# Patient Record
Sex: Female | Born: 1970 | Race: Black or African American | Hispanic: No | Marital: Married | Smoking: Never smoker
Health system: Southern US, Community
[De-identification: ages and names within clinical notes are randomized; demographics above are authoritative.]

## PROBLEM LIST (undated history)

## (undated) DIAGNOSIS — I10 Essential (primary) hypertension: Secondary | ICD-10-CM

## (undated) DIAGNOSIS — R748 Abnormal levels of other serum enzymes: Secondary | ICD-10-CM

## (undated) DIAGNOSIS — K219 Gastro-esophageal reflux disease without esophagitis: Secondary | ICD-10-CM

## (undated) HISTORY — PX: DILATION AND CURETTAGE OF UTERUS: SHX78

## (undated) HISTORY — PX: ECTOPIC PREGNANCY SURGERY: SHX613

---

## 2000-07-13 ENCOUNTER — Other Ambulatory Visit: Admission: RE | Admit: 2000-07-13 | Discharge: 2000-07-13 | Payer: Self-pay | Admitting: Gynecology

## 2003-02-11 ENCOUNTER — Other Ambulatory Visit: Admission: RE | Admit: 2003-02-11 | Discharge: 2003-02-11 | Payer: Self-pay | Admitting: Gynecology

## 2004-08-18 ENCOUNTER — Other Ambulatory Visit: Admission: RE | Admit: 2004-08-18 | Discharge: 2004-08-18 | Payer: Self-pay | Admitting: Gynecology

## 2007-07-18 ENCOUNTER — Inpatient Hospital Stay (HOSPITAL_COMMUNITY): Admission: AD | Admit: 2007-07-18 | Discharge: 2007-07-18 | Payer: Self-pay | Admitting: Obstetrics and Gynecology

## 2007-07-21 ENCOUNTER — Inpatient Hospital Stay (HOSPITAL_COMMUNITY): Admission: AD | Admit: 2007-07-21 | Discharge: 2007-07-21 | Payer: Self-pay | Admitting: Obstetrics and Gynecology

## 2007-07-24 ENCOUNTER — Inpatient Hospital Stay (HOSPITAL_COMMUNITY): Admission: AD | Admit: 2007-07-24 | Discharge: 2007-07-24 | Payer: Self-pay | Admitting: Family Medicine

## 2007-07-27 ENCOUNTER — Ambulatory Visit (HOSPITAL_COMMUNITY): Admission: AD | Admit: 2007-07-27 | Discharge: 2007-07-27 | Payer: Self-pay | Admitting: Obstetrics and Gynecology

## 2007-07-27 ENCOUNTER — Encounter (INDEPENDENT_AMBULATORY_CARE_PROVIDER_SITE_OTHER): Payer: Self-pay | Admitting: Obstetrics and Gynecology

## 2008-01-30 ENCOUNTER — Other Ambulatory Visit: Admission: RE | Admit: 2008-01-30 | Discharge: 2008-01-30 | Payer: Self-pay | Admitting: Obstetrics and Gynecology

## 2008-03-28 ENCOUNTER — Ambulatory Visit (HOSPITAL_COMMUNITY): Admission: RE | Admit: 2008-03-28 | Discharge: 2008-03-28 | Payer: Self-pay | Admitting: Obstetrics & Gynecology

## 2008-04-25 ENCOUNTER — Ambulatory Visit (HOSPITAL_COMMUNITY): Admission: RE | Admit: 2008-04-25 | Discharge: 2008-04-25 | Payer: Self-pay | Admitting: Obstetrics & Gynecology

## 2008-06-22 ENCOUNTER — Ambulatory Visit: Payer: Self-pay | Admitting: Family Medicine

## 2008-06-22 ENCOUNTER — Inpatient Hospital Stay (HOSPITAL_COMMUNITY): Admission: AD | Admit: 2008-06-22 | Discharge: 2008-06-22 | Payer: Self-pay | Admitting: Obstetrics & Gynecology

## 2008-06-25 ENCOUNTER — Inpatient Hospital Stay (HOSPITAL_COMMUNITY): Admission: AD | Admit: 2008-06-25 | Discharge: 2008-07-03 | Payer: Self-pay | Admitting: Obstetrics & Gynecology

## 2008-06-25 ENCOUNTER — Ambulatory Visit: Payer: Self-pay | Admitting: Gynecology

## 2008-06-26 ENCOUNTER — Encounter: Payer: Self-pay | Admitting: Obstetrics & Gynecology

## 2008-06-27 ENCOUNTER — Encounter: Payer: Self-pay | Admitting: Obstetrics & Gynecology

## 2008-06-30 ENCOUNTER — Encounter: Payer: Self-pay | Admitting: Obstetrics & Gynecology

## 2008-06-30 ENCOUNTER — Encounter (INDEPENDENT_AMBULATORY_CARE_PROVIDER_SITE_OTHER): Payer: Self-pay | Admitting: Gynecology

## 2008-06-30 HISTORY — PX: TUBAL LIGATION: SHX77

## 2008-07-07 ENCOUNTER — Ambulatory Visit: Payer: Self-pay | Admitting: Obstetrics & Gynecology

## 2008-07-09 ENCOUNTER — Ambulatory Visit: Payer: Self-pay | Admitting: Obstetrics & Gynecology

## 2008-07-16 ENCOUNTER — Ambulatory Visit: Payer: Self-pay | Admitting: Obstetrics & Gynecology

## 2010-10-27 ENCOUNTER — Encounter: Admission: RE | Admit: 2010-10-27 | Payer: Self-pay | Source: Home / Self Care | Admitting: Internal Medicine

## 2010-11-02 ENCOUNTER — Encounter
Admission: RE | Admit: 2010-11-02 | Discharge: 2010-11-02 | Payer: Self-pay | Source: Home / Self Care | Attending: Internal Medicine | Admitting: Internal Medicine

## 2010-11-03 ENCOUNTER — Ambulatory Visit: Payer: Worker's Compensation | Attending: Internal Medicine | Admitting: Physical Therapy

## 2010-11-03 DIAGNOSIS — IMO0001 Reserved for inherently not codable concepts without codable children: Secondary | ICD-10-CM | POA: Insufficient documentation

## 2010-11-03 DIAGNOSIS — M546 Pain in thoracic spine: Secondary | ICD-10-CM | POA: Insufficient documentation

## 2010-11-03 DIAGNOSIS — M542 Cervicalgia: Secondary | ICD-10-CM | POA: Insufficient documentation

## 2010-11-03 DIAGNOSIS — R5381 Other malaise: Secondary | ICD-10-CM | POA: Insufficient documentation

## 2010-11-03 DIAGNOSIS — M25559 Pain in unspecified hip: Secondary | ICD-10-CM | POA: Insufficient documentation

## 2010-11-09 ENCOUNTER — Ambulatory Visit: Payer: Worker's Compensation | Admitting: *Deleted

## 2010-11-11 ENCOUNTER — Ambulatory Visit: Payer: Worker's Compensation | Admitting: *Deleted

## 2010-11-16 ENCOUNTER — Ambulatory Visit: Payer: Worker's Compensation | Admitting: Physical Therapy

## 2010-11-18 ENCOUNTER — Ambulatory Visit: Payer: Worker's Compensation | Admitting: Physical Therapy

## 2010-11-23 ENCOUNTER — Ambulatory Visit: Payer: Worker's Compensation | Admitting: Physical Therapy

## 2010-11-25 ENCOUNTER — Ambulatory Visit: Payer: Worker's Compensation | Admitting: Physical Therapy

## 2010-11-30 ENCOUNTER — Ambulatory Visit: Payer: Worker's Compensation | Admitting: Physical Therapy

## 2010-12-02 ENCOUNTER — Ambulatory Visit: Payer: Worker's Compensation | Admitting: Physical Therapy

## 2011-02-15 NOTE — Op Note (Signed)
NAME:  Sabrina [name], Sabrina [name] [name] NO.:  [id number]   MEDICAL RECORD NO.:  [id number]          PATIENT TYPE:  MAT   LOCATION:  MATC                          [facility]ILITY:  [facility]   PHYSICIAN:  Sabrina [name] Sabrina [name] [name], M.D. DATE OF BIRTH:  1971-03-27   DATE OF PROCEDURE:  07/27/2007  DATE OF DISCHARGE:                               OPERATIVE REPORT   PREOPERATIVE DIAGNOSIS:  Right ectopic pregnancy.   POSTOPERATIVE DIAGNOSES:  1. Right ectopic pregnancy.  2. Uterine fibroids.   OPERATION PERFORMED:  1. Laparoscopy with salpingostomy for a cure of ectopic pregnancy.   SURGEON:  Sabrina [name] Sabrina [name] [name], M.D.   ASSISTANT:  No assistant.   ANESTHESIA:  General.   ESTIMATED BLOOD LOSS:  The estimated blood loss including preexisting  hemoperitoneum was 500 mL.   DESCRIPTION OF THE OPERATION:  After being informed of the planned  procedure with possible complications including bleeding with need a  transfusion, infection, injury to other organs, need for salpingectomy,  and need for laparotomy informed consent was obtained.  The patient is  brought to OR #3 and was given general anesthesia with endotracheal  intubation without any complication.  She was placed in the lithotomy  position, prepped and draped in a sterile fashion.  A Foley catheter was  inserted in her bladder.  Pelvic exam revealed an anteverted uterus,  which was bulky and mobile, right adnexa which was bulky and a left  adnexa which was normal.  A speculum was inserted, anterior lip of the  cervix was grasped with a tenaculum forceps and an acorn manipulator was  placed in the uterus.   We infiltrate the umbilical area with 5 mL of Marcaine 0.25 and perform  a semi elliptical incision, which was brought down sharply to the  fascia.  The fascia was identified, grasped with Kocher forceps and  incised.  Peritoneum was entered bluntly.  A pursestring suture of 0-  Vicryl was placed on the fascia and a 10 mm Hassan trocar was  easily  inserted and held in place with the previously placed suture.  This  allowed [place] for insufflation of the pneumoperitoneum with CO2 at a  maximum pressure of 15 mmHg.  A laparoscope was inserted in the abdomen.  Observation:  We noted a 500 mL preexisting hemoperitoneum with  organized clot, a uterus that was difficult to mobilize because of a  large 6 cm x 6 cm anterior lower uterine segment fibroid, left tube that  appeared normal, and left ovary was normal.  We were unable to see the  right side of the pelvis and so a 10 mm suprapubic trocar as well as a 5  mm right lower quadrant trocar were inserted under direct visualization  after infiltrating with Marcaine 0.25.  This allowed [place] suction of the  preexisting clots and mobilization of the right tube, which had a 4.5 cm  ectopic pregnancy in the isthmic ampullary area.  There was no active  bleeding.  The ovary appeared normal.   Using vasopressin 20 mL and 50 mL we infiltrated the mesosalpinx and the  serosa of the tube until  complete blanching.  Using a monopolar Nezhat-  tip we opened the tube for a 1.5-cm distance and using hydrodissection  and suction we removed the pregnancy, which was sent separately to  pathology.  We then irrigated profusely and we did note three sites of  bleeding on our salpingostomy, which were controlled with bipolar  cauterization.  We again irrigated profusely and removed all clots.  We  had satisfactory hemostasis.  Instruments were removed after evacuating  the pneumoperitoneum.   The umbilical fascia was closed with previously placed purse-string  suture.  The suprapubic fascia was not closed being that it was  inaccessible to the thickness of the subcutaneous tissues and so the  skin of all three incisions was closed with a subcuticular suture of 4-0  Vicryl and Steri-Strips.   COUNTS:  Instrument and sponge counts were complete x2.   Estimated blood loss including the preexisting  hemoperitoneum was 500 mL  for a minimal blood loss during surgery.   The procedure was very well tolerated by the patient who was taken to  recovery room in a well and stable condition; and, will be discharged  home today.      Sabrina [name] Sabrina [name] [name], M.D.  Electronically Signed     SAR/MEDQ  D:  07/27/2007  T:  07/28/2007  Job:  [id number]

## 2011-02-15 NOTE — H&P (Signed)
NAME:  Sabrina [name], Sabrina [name] NO.:  [id number]   MEDICAL RECORD NO.:  [id number]          PATIENT TYPE:  INP   LOCATION:  9157                          [facility]ILITY:  [facility]   PHYSICIAN:  Lazaro Arms, M.D.   DATE OF BIRTH:  [birth year]   DATE OF ADMISSION:  06/25/2008  DATE OF DISCHARGE:                              HISTORY & PHYSICAL   Sabrina [name] is a 40 year old African American female, gravida 6, para 0-0-5-  0 (one ectopic and four miscarriages).  Estimated date of delivery of  August 30, 1999 by last menstrual period and confirmatory 8-week  sonogram.  The patient's prenatal course has been remarkable because of  an increased Trisomy 18 risk on triple testing and had an ultrasound  here with maternal fetal medicine which showed an echogenic intracardiac  focus, most likely calcified papillary muscle which is a very soft  marker for Down syndrome.  The patient declined amniocentesis at that  time.  Otherwise the pregnancy has been unremarkable.  To my knowledge  the patient  did not have her full chart.  We have only seen her during  the pregnancy.  Had not had evaluation for her multiple early losses. In  any event she came in for a routine visit on June 23, 2008 and had  a blood pressure of 160/90.  I repeated it and it was 150/90 and she had  2+ protein.  That was in the afternoon.  I did labs and began a 24-hour  urine the next morning which she brought back this morning.  It  subsequently returned as 460 mg in 24 hours.  All of her labs were  normal.  She has an elevated white count of 20,000.  I gave her two  doses of Celestone.  She has a uric acid of 6 but platelets and liver  function tests are otherwise normal.  The patient states symptomatically  no headache and no other symptoms at all.  When she came in today to be  seen to go over her lab work and 24-hour urine, her blood pressure was  140/90 and  had 4+ protein.  As a result I am going to admit her to  the  hospital for in-house evaluation with antepartum fetal testing and  ultrasound evaluation.   When I arrived, she had an ultrasound which revealed an estimated fetal  weight of 19th percentile with normal fluid and she was placed on the  monitor and actually has had a very flat strip.  No significant  decelerations noted but very flat.  As a result, I ordered a biophysical  profile which was 8 out of 8, of course NST was 8 out of 10 and had  normal Doppler flow studies as well.  At the present, her blood  pressures are actually stable at 140/90 being the max and under that.  We are going to restart a 24-hour urine tomorrow morning and laboratory  evaluation will be done at the discretion of the physicians going  forward. Again she has received her Celestone.  She did have a normal  Glucola.  The patient  understands very likely this is evolving  preeclampsia and she will require delivery in the coming days, hopefully  further out.   PAST MEDICAL HISTORY:  Negative.   PAST SURGICAL HISTORY:  She has had a D&C and surgery for right ectopic.   OB HISTORY:  As stated above.  She had an ectopic back in October 2008  here at [facility].   ALLERGIES:  None.   SOCIAL HISTORY:  She is married.  She works at [employer].   FAMILY HISTORY:  Hypertension and diabetes.   PHYSICAL EXAMINATION:  HEENT:  Unremarkable.  NECK:  Thyroid is normal.  LUNGS:  Clear.  HEART:  Regular rate and rhythm.  No murmur, rub, or gallop.  BREASTS:  Deferred.  ABDOMEN:  Fundal height of 30 cm.  PELVIC:  Cervix is long, thick and closed.  EXTREMITIES:  No edema.  NEUROLOGIC:  Grossly intact.  DTRs 2+.  Really no significant edema.   LABORATORY DATA:  Blood type is B positive.  Rubella is immune.  Hepatitis B negative.  HIV is nonreactive. Serology is nonreactive. Pap  smear was normal.  GC and Chlamydia negative.  Her AFP again showed  increased Trisomy risk of 1 to 66 with the above findings  noted on  ultrasound.  Her 28-week hemoglobin was 11.6, hematocrit 34.6. Glucola  112. Sickle test was negative.   IMPRESSION:  1. Intrauterine pregnancy at 30 weeks and 5 days gestation.  2. Evolving preeclampsia, presently mild by criteria.  3. Estimated fetal weight of 19th percentile with normal fluid.  4. Status post Celestone x2.  5. Reassuring antepartum fetal testing with biophysical profile of 8      out of 10 and normal Doppler flow studies.  However, with ongoing      flat nonstress test.   PLAN:  The patient will be continuing to undergo antepartum fetal  testing.  Will do another biophysical profile in the morning.  We will  restart her 24-hour urine in the morning as well  and of course, manage  her appropriately to the status of her evolving preeclampsia.  The  patient and her husband understand and she has known this for the past  couple of days when I have seen her and understands that is why she got  the steroids in anticipation of  a preterm delivery based on her  preeclampsia.      Lazaro Arms, M.D.  Electronically Signed     LHE/MEDQ  D:  06/25/2008  T:  06/26/2008  Job:  [id number]

## 2011-02-15 NOTE — Op Note (Signed)
NAME:  Sabrina [name], Sabrina [name] NO.:  [id number]   MEDICAL RECORD NO.:  [id number]           PATIENT TYPE:   LOCATION:                                [facility]   PHYSICIAN:  Ginger Carne, MD  DATE OF BIRTH:  Jun 05, 1971   DATE OF PROCEDURE:  DATE OF DISCHARGE:                               OPERATIVE REPORT   PREOPERATIVE DIAGNOSES:  31-3/7 weeks intrauterine growth retardation,  nonreassuring fetal heart rate, mild pre-eclampsia, and sterilization.   POSTOPERATIVE DIAGNOSES:  31-3/7 weeks intrauterine growth retardation,  nonreassuring fetal heart rate, mild pre-eclampsia, and sterilization,  preterm viable delivery of female infant.   PROCEDURE:  Primary low transverse cesarean section and Pomeroy  bilateral tubal ligation.   SURGEON:  Ginger Carne, MD   ASSISTANT:  None.   COMPLICATIONS:  None immediate.   ESTIMATED BLOOD LOSS:  700 mL.   Operative findings is that of a preterm infant female in a transverse  lie.  No gross abnormalities.  Baby cried spontaneously at delivery.  Uterus, tubes, and ovaries showed normal decidual changes of pregnancy.  Both tubes were identified from their isthmus to fimbriated ends  separated apart from their respective round ligaments.   OPERATIVE PROCEDURE:  The patient prepped and draped in the usual  fashion and placed in the left lateral supine position.  Betadine  solution used for antiseptic, and the patient was catheterized prior to  procedure.  After adequate spinal analgesia, a Pfannenstiel incision was  made and the abdomen opened.  Lower uterine segment incised transversely  after developing the bladder flap.  Because of the transverse lie, the  incision was not adequate to safely deliver the fetus, and a T-incision  was made to adequately do so.  Afterwards, the baby delivered.  The cord  clamped and cut, and infant given to the pediatric staff after bulb  suctioning.  Placenta removed manually.  Uterus  was inspected.  Closure  of the uterine musculature in one layer with 0 Vicryl running  interlocking suture.  Bleeding points hemostatically checked.  Blood  clots removed.   A Pomeroy bilateral tubal ligation was performed on either side by  bilaterally accessing 2-4 cm of tube placed in a loop fashion.  A 2-0  plain catgut was then placed around the tube twice.  Afterwards, the  tubes were cut above said knot, sent to pathology, the tips cauterized.  Uterus replaced in the abdomen.  Bleeding points hemostatically  checked.  Blood clots removed.  Closure of the fascia in one layer with  0 PDS running double loop suture and the skin staples for the skin.  Instrument and sponge count were correct.  The patient tolerated the  procedure well, returned to the postanesthesia recovery room in  excellent condition.      Ginger Carne, MD  Electronically Signed     SHB/MEDQ  D:  06/30/2008  T:  07/01/2008  Job:  [id number]

## 2011-02-15 NOTE — Discharge Summary (Signed)
NAME:  Sabrina [name], Sabrina [name] NO.:  [id number]   MEDICAL RECORD NO.:  [id number]          PATIENT TYPE:  INP   LOCATION:  9304                          [facility]   PHYSICIAN:  Ginger Carne, MD  DATE OF BIRTH:  03/15/1971   DATE OF ADMISSION:  06/25/2008  DATE OF DISCHARGE:  07/03/2008                               DISCHARGE SUMMARY   FINAL DIAGNOSES:  31-3/7 weeks severe preeclampsia, fetal growth lag and  nonreassuring fetal heart rate, persistent hypertension   PROCEDURE:  Primary low-transverse cesarean section and T incision,  postpartum sterilization, Pomeroy right bilateral tubal ligation.   REASON FOR ADMISSION:  This is a G6, P-0-0-5-0 who was well dated at 23-  3/7th weeks when she presented for a prenatal visit at [facility] where  she was being followed for evolving preeclampsia.  Her pregnancy was  otherwise notable for increased T-18 rest, she declined amnio.  Her  prenatal visit 2 days before day of admission showed blood pressures  115/92, 2+ proteinuria.  Her 24-hour urine came back with 460 mg of  protein.  She was treated with Celestone.  She was asymptomatic for  preeclampsia.  Her uric acid was 6, platelets and LFTs were normal then  on day of admission, she again was 147/90 but had 4+ proteinuria.  Ultrasound for BPP was 8/8 without significant decelerations.  She had  normal Doppler studies and she was admitted for evaluation of monitoring  of her evolving preeclampsia.   PERTINENT LABORATORY DATA:  Her 24-hour inpatient urine collection was  significant for 714 mg of protein.  Her admission hemoglobin was 11.2.  Postoperatively, she was 8.8.  Her platelets remained stable in the  200s.  Her routine chemistries were essentially within normal limits.  Her ultrasound on day of admission showed growth lag of the fetus to  19th percentile.  AFI was normal.  Placenta was normal and baby was  cephalic with normal AFI.  Her BPP was 8/8, AST  was 3.47, no ADSV, no  RDFC.  Two days later her Dopplers studies remained normal.  She did  have an 8/8 BPP.   PROCEDURES:  Primary low-transverse cesarean section of viable infant  who was sent to NICU, Pomeroy bilateral tubal ligation, iron deficiency,  acute blood loss anemia.   HOSPITAL COURSE:  The patient was placed on serial BP checks, INO and  continuous monitoring as well as daily fetal testing with MFM  consultations.  On hospital day #3, she was noted to meet criteria for  severe preeclampsia with 2 episodes of severe range of BP.  Fetal  testing remained reassuring.  On hospital day #4, she was placed on  Lasix and labetalol due to increasing blood pressures.  On hospital day  #5, there was some concern with nonreassuring fetal heart rate tracing,  some late decelerations, which spontaneously resolved.  For this reason,  MFM came and evaluated the patient and due to her worsening  preeclampsia, lagging fetal growth and nonreassuring fetal heart rate,  they did recommend delivery with max sulfate given for seizure  prophylaxis.  She, at  that time, had her primary LTCS with Pomeroy  bilateral postpartum tubal ligation.  There was a T incision may post.  Operatively, she did fairly well with her diuresis and mag was  discontinued after 24 hours.  Her blood pressures, however, did remain  in the elevated range of 150/70s to high 80s and for this reason, she  was given labetalol 200 mg twice a day as well as hydrochlorothiazide 25  mg once a day to control her blood pressure.  She was also placed on  iron tablet 1 a day, prenatal vitamin 1 a day, ibuprofen 600 every 6  hours and Percocet 1 or 2 every 4-6 hours.  Her followup was to be  with home health to remove her staples on July 08, 2008, and 4-week  visit for a postoperative check and blood pressure check at [facility]  in 4 weeks.  Her instructions were to return if she had any concerns  with her wound healing or any  fevers, other questions or concerns.      Deirdre Christy Gentles, C.N.M.      Ginger Carne, MD     DP/MEDQ  D:  08/11/2008  T:  08/11/2008  Job:  [id number]

## 2011-07-04 LAB — COMPREHENSIVE METABOLIC PANEL
AST: 16
Albumin: 2.5 — ABNORMAL LOW
Albumin: 2.5 — ABNORMAL LOW
Alkaline Phosphatase: 132 — ABNORMAL HIGH
Alkaline Phosphatase: 149 — ABNORMAL HIGH
BUN: 8
BUN: 8
CO2: 24
Calcium: 9.1
Calcium: 9.2
Chloride: 107
Chloride: 109
Creatinine, Ser: 0.67
Creatinine, Ser: 0.71
GFR calc Af Amer: >60
GFR calc non Af Amer: >60
Glucose, Bld: 73
Potassium: 3.7
Potassium: 4
Sodium: 132 — ABNORMAL LOW
Total Bilirubin: 0.2 — ABNORMAL LOW
Total Bilirubin: 0.3
Total Protein: 5.7 — ABNORMAL LOW
Total Protein: 5.8 — ABNORMAL LOW

## 2011-07-04 LAB — CBC
HCT: 26.1 — ABNORMAL LOW
HCT: 33.3 — ABNORMAL LOW
HCT: 35.9 — ABNORMAL LOW
Hemoglobin: 11.1 — ABNORMAL LOW
Hemoglobin: 11.2 — ABNORMAL LOW
Hemoglobin: 11.9 — ABNORMAL LOW
MCHC: 33.2
MCHC: 33.8
MCHC: 34.5
MCV: 94.9
MCV: 97.2
MCV: 98.8
Platelets: 278
RBC: 2.65 — ABNORMAL LOW
RBC: 3.39 — ABNORMAL LOW
RBC: 3.42 — ABNORMAL LOW
RDW: 14
WBC: 10
WBC: 11.9 — ABNORMAL HIGH
WBC: 8.8

## 2011-07-04 LAB — BASIC METABOLIC PANEL
BUN: 9
CO2: 28
Calcium: 7.3 — ABNORMAL LOW
Chloride: 102
Creatinine, Ser: 0.88
Glucose, Bld: 99

## 2011-07-04 LAB — URINALYSIS, ROUTINE W REFLEX MICROSCOPIC
Glucose, UA: NEGATIVE
Ketones, ur: NEGATIVE
Urobilinogen, UA: 0.2
pH: 6

## 2011-07-04 LAB — TYPE AND SCREEN: Antibody Screen: NEGATIVE

## 2011-07-04 LAB — URINE MICROSCOPIC-ADD ON

## 2011-07-04 LAB — PROTEIN, URINE, 24 HOUR: Urine Total Volume-UPROT: 1400

## 2011-07-04 LAB — CREATININE CLEARANCE, URINE, 24 HOUR
Creatinine Clearance: 157 — ABNORMAL HIGH
Creatinine, 24H Ur: 1782
Creatinine, Urine: 127.3
Creatinine: 0.79

## 2011-07-04 LAB — RAPID URINE DRUG SCREEN, HOSP PERFORMED
Amphetamines: NOT DETECTED
Barbiturates: NOT DETECTED
Benzodiazepines: NOT DETECTED
Cocaine: NOT DETECTED
Opiates: NOT DETECTED
Tetrahydrocannabinol: NOT DETECTED

## 2011-07-04 LAB — CREATININE, SERUM: GFR calc non Af Amer: >60

## 2011-07-13 LAB — DIFFERENTIAL
Basophils Absolute: 0
Eosinophils Absolute: 0.1
Eosinophils Relative: 2
Lymphocytes Relative: 23
Lymphs Abs: 1.1
Monocytes Absolute: 0.5

## 2011-07-13 LAB — CBC
HCT: 26.8 — ABNORMAL LOW
HCT: 34.5 — ABNORMAL LOW
Hemoglobin: 11.8 — ABNORMAL LOW
Hemoglobin: 9.2 — ABNORMAL LOW
MCHC: 34.3
MCV: 89.6
RDW: 12.4
RDW: 12.7

## 2011-07-13 LAB — URINALYSIS, ROUTINE W REFLEX MICROSCOPIC
Bilirubin Urine: NEGATIVE
Glucose, UA: NEGATIVE
Ketones, ur: NEGATIVE
Protein, ur: NEGATIVE
Urobilinogen, UA: 0.2

## 2011-07-13 LAB — HCG, QUANTITATIVE, PREGNANCY
hCG, Beta Chain, Quant, S: 3268 — ABNORMAL HIGH
hCG, Beta Chain, Quant, S: 4645 — ABNORMAL HIGH

## 2011-07-13 LAB — URINE MICROSCOPIC-ADD ON

## 2011-07-13 LAB — CREATININE, SERUM
GFR calc Af Amer: >60
GFR calc non Af Amer: >60

## 2011-07-13 LAB — POCT PREGNANCY, URINE
Operator id: 26670
Preg Test, Ur: POSITIVE

## 2011-07-13 LAB — BUN: BUN: 6

## 2011-07-13 LAB — GC/CHLAMYDIA PROBE AMP, GENITAL: Chlamydia, DNA Probe: NEGATIVE

## 2011-07-13 LAB — WET PREP, GENITAL: Trich, Wet Prep: NONE SEEN

## 2012-04-16 ENCOUNTER — Emergency Department (HOSPITAL_COMMUNITY)
Admission: EM | Admit: 2012-04-16 | Discharge: 2012-04-17 | Disposition: A | Discharge status: Home / Self Care | Payer: 59 | Attending: Emergency Medicine | Admitting: Emergency Medicine

## 2012-04-16 ENCOUNTER — Emergency Department (HOSPITAL_COMMUNITY): Payer: 59

## 2012-04-16 ENCOUNTER — Encounter (HOSPITAL_COMMUNITY): Payer: Self-pay | Admitting: Emergency Medicine

## 2012-04-16 DIAGNOSIS — R079 Chest pain, unspecified: Secondary | ICD-10-CM | POA: Insufficient documentation

## 2012-04-16 DIAGNOSIS — M549 Dorsalgia, unspecified: Secondary | ICD-10-CM | POA: Insufficient documentation

## 2012-04-16 DIAGNOSIS — I10 Essential (primary) hypertension: Secondary | ICD-10-CM | POA: Insufficient documentation

## 2012-04-16 HISTORY — DX: Essential (primary) hypertension: I10

## 2012-04-16 MED ORDER — HYDROCODONE-ACETAMINOPHEN 5-325 MG PO TABS: 2.0000 | ORAL_TABLET | ORAL | Status: AC | PRN

## 2012-04-16 MED ORDER — DIAZEPAM 5 MG PO TABS: 5.0000 mg | ORAL_TABLET | Freq: Two times a day (BID) | ORAL | Status: AC

## 2012-04-16 NOTE — ED Provider Notes (Signed)
History     CSN: [id number]  Arrival date & time 04/16/12  2154   First MD Initiated Contact with Patient 04/16/12 2227      Chief Complaint  Patient presents with  . Back Pain    (Consider location/radiation/quality/duration/timing/severity/associated sxs/prior treatment) HPI Comments: Patient reports that she is having posterior left lateral rib pain since yesterday.  Pain gradually worsening.  She reports that she fell down 3 days ago, but did not have any pain until yesterday.  Pain worse with deep breaths and worse with lateral movement of the back.  She thought that the pain felt like a pulled muscle.  She has taken Flexeril, but does not feel that it helped.  She denies any numbness or tingling.  Denies bowel or bladder incontinence.  No fever or chills.  No cough.    The history is provided by the patient.    Past Medical History  Diagnosis Date  . Hypertension     Past Surgical History  Procedure Date  . Cesarean section   . Ectopic pregnancy surgery     Family History  Problem Relation Age of Onset  . Pulmonary embolism Sister   . Hypertension Other   . Diabetes Other     History  Substance Use Topics  . Smoking status: Never Smoker   . Smokeless tobacco: Not on file  . Alcohol Use: No    OB History    Grav Para Term Preterm Abortions TAB SAB Ect Mult Living                  Review of Systems  Constitutional: Negative for fever and chills.  HENT: Negative for neck pain and neck stiffness.   Respiratory: Negative for cough and shortness of breath.   Cardiovascular: Negative for chest pain.  Gastrointestinal: Negative for nausea and vomiting.  Musculoskeletal: Positive for back pain. Negative for gait problem.  Skin: Negative for color change and wound.  Neurological: Negative for weakness and numbness.    Allergies  Review of patient's allergies indicates no known allergies.  Home Medications   Current Outpatient Rx  Name Route Sig Dispense  Refill  . CYCLOBENZAPRINE HCL 10 MG PO TABS Oral Take 5 mg by mouth 3 (three) times daily as needed. For muscle spasms.    . MELOXICAM 7.5 MG PO TABS Oral Take 15 mg by mouth daily as needed. For pain.    Marland Kitchen SIMETHICONE 80 MG PO CHEW Oral Chew 80 mg by mouth every 6 (six) hours as needed. For gas.    Marland Kitchen VITAMIN D (ERGOCALCIFEROL) 50000 UNITS PO CAPS Oral Take 50,000 Units by mouth every 7 (seven) days. Taken on Fridays.      BP 152/93  Pulse 76  Temp 98.3 F (36.8 C) (Oral)  Resp 18  SpO2 100%  LMP 03/30/2012  Physical Exam  Nursing note and vitals reviewed. Constitutional: She appears well-developed and well-nourished. No distress.  HENT:  Head: Normocephalic and atraumatic.  Mouth/Throat: Oropharynx is clear and moist.  Neck: Normal range of motion. Neck supple.  Cardiovascular: Normal rate, regular rhythm, normal heart sounds and intact distal pulses.   Pulmonary/Chest: Effort normal. No respiratory distress. She has no decreased breath sounds. She has no wheezes. She has no rhonchi. She has no rales.    Musculoskeletal: Normal range of motion.  Neurological: She is alert.  Skin: Skin is warm, dry and intact. No abrasion, no bruising and no ecchymosis noted. She is not diaphoretic.  Psychiatric: She  has a normal mood and affect.    ED Course  Procedures (including critical care time)  Labs Reviewed - No data to display Dg Ribs Unilateral W/chest Left  04/16/2012  *RADIOLOGY REPORT*  Clinical Data: Fall, left rib pain  LEFT RIBS AND CHEST - 3+ VIEW  Comparison: None.  Findings: Cardiomediastinal silhouette is within normal limits. The lungs are clear. No pleural effusion.  No pneumothorax.  No acute osseous abnormality.  No displaced left-sided rib fracture.  IMPRESSION: No acute abnormality or displaced left-sided rib fracture.  Original Report Authenticated By: Harrel Lemon, M.D.     No diagnosis found.    MDM  Negative xray.  No shortness of breath.  Suspect  muscle strain.  Patient discharged home with short course of pain medication and muscle relaxer.  Return precautions discussed.        Pascal Lux Leon, PA-C 04/17/12 [phone]

## 2012-04-16 NOTE — ED Notes (Signed)
Pt states she is having pain in her on her left side on her back near her rib  Pt states pain increases with breathing  Pt states she thought it was a pulled muscle so she took a muscle relaxer without relief

## 2012-04-17 NOTE — ED Provider Notes (Signed)
Medical screening examination/treatment/procedure(s) were performed by non-physician practitioner and as supervising physician I was immediately available for consultation/collaboration.  Flint Melter, MD 04/17/12 [phone]

## 2012-04-17 NOTE — ED Notes (Signed)
Patient discharge to home ambulatory with a steady gait. Respirations equal and unlabored. Skin warm and dry. No acute distress noted.

## 2013-01-25 ENCOUNTER — Encounter: Payer: Self-pay | Admitting: Family Medicine

## 2013-01-25 ENCOUNTER — Ambulatory Visit (INDEPENDENT_AMBULATORY_CARE_PROVIDER_SITE_OTHER): Payer: 59 | Admitting: Family Medicine

## 2013-01-25 VITALS — BP 118/71 | HR 85 | Temp 99.1°F | Ht 65.0 in | Wt 202.8 lb

## 2013-01-25 DIAGNOSIS — E559 Vitamin D deficiency, unspecified: Secondary | ICD-10-CM

## 2013-01-25 DIAGNOSIS — I10 Essential (primary) hypertension: Secondary | ICD-10-CM

## 2013-01-25 DIAGNOSIS — J302 Other seasonal allergic rhinitis: Secondary | ICD-10-CM

## 2013-01-25 DIAGNOSIS — J309 Allergic rhinitis, unspecified: Secondary | ICD-10-CM

## 2013-01-25 MED ORDER — FLUTICASONE PROPIONATE 50 MCG/ACT NA SUSP: 2.0000 | Freq: Every day | NASAL | Status: DC

## 2013-01-25 MED ORDER — AMLODIPINE BESYLATE 5 MG PO TABS: 5.0000 mg | ORAL_TABLET | Freq: Every day | ORAL | Status: DC

## 2013-01-25 MED ORDER — FEXOFENADINE HCL 180 MG PO TABS: 180.0000 mg | ORAL_TABLET | Freq: Every day | ORAL | Status: DC

## 2013-01-25 NOTE — Patient Instructions (Signed)
      Dr Charls Custer's Recommendations  Diet and Exercise discussed with patient.  For nutrition information, I recommend books:  1).Eat to Live by Dr Joel Fuhrman. 2).Prevent and Reverse Heart Disease by Dr Caldwell Esselstyn.  Exercise recommendations are:  If unable to walk, then the patient can exercise in a chair 3 times a day. By flapping arms like a bird gently and raising legs outwards to the front.  If ambulatory, the patient can go for walks for 30 minutes 3 times a week. Then increase the intensity and duration as tolerated.  Goal is to try to attain exercise frequency to 5 times a week.  If applicable: Best to perform resistance exercises (machines or weights) 2 days a week and cardio type exercises 3 days per week.  

## 2013-01-25 NOTE — Progress Notes (Signed)
Patient ID: Sabrina [name], female   DOB: 03/17/1971, 42 y.o.   MRN: [id number] SUBJECTIVE: HPI: Congested for:   Days due to allergies    has had runny and itchy eyes. has had runny, itchy and stuffy nose. has had Sneezing as well. has had Coughing has had no  Wheezing  Medications used for this problem: has not had effective response.  Patient is here for follow up of hypertension: denies Headache;deniesChest Pain;denies weakness;denies Shortness of Breath or Orthopnea;denies Visual changes;denies palpitations;denies cough;denies pedal edema;denies symptoms of TIA or stroke; admits to Compliance with medications. denies Problems with medications.   PMH/PSH: reviewed/updated in Epic  SH/FH: reviewed/updated in Epic.Occupation: Med tech  Allergies: reviewed/updated in [employer]  Medications: reviewed/updated in [employer]  Immunizations: reviewed/updated in Epic  ROS: As above in the HPI. All other systems are stable or negative.  OBJECTIVE: APPEARANCE:  AAF  Patient in no acute distress.The patient appeared well nourished and normally developed. Acyanotic. Waist: VITAL SIGNS:BP 118/71  Pulse 85  Temp(Src) 99.1 F (37.3 C) (Oral)  Ht 5\' 5"  (1.651 m)  Wt 202 lb 12.8 oz (91.989 kg)  BMI 33.75 kg/m2  LMP 12/25/2012   SKIN: warm and  Dry without overt rashes, tattoos and scars  HEAD and Neck: without JVD, Head and scalp: normal Eyes:No scleral icterus. Fundi normal, eye movements normal. Ears: Auricle normal, canal normal, Tympanic membranes normal, insufflation normal. Nose: normal Throat: normal Neck & thyroid: normal  CHEST & LUNGS: Chest wall: normal Lungs: Clear  CVS: Reveals the PMI to be normally located. Regular rhythm, First and Second Heart sounds are normal,  absence of murmurs, rubs or gallops. Peripheral vasculature: Radial pulses: normal Dorsal pedis pulses: normal Posterior pulses: normal  ABDOMEN:  Appearance: normal Benign,, no  organomegaly, no masses, no Abdominal Aortic enlargement. No Guarding , no rebound. No Bruits. Bowel sounds: normal  RECTAL: N/A GU: N/A  EXTREMETIES: nonedematous. Both Femoral and Pedal pulses are normal.  MUSCULOSKELETAL:  Spine: normal Joints: intact  NEUROLOGIC: oriented to time,place and person; nonfocal. Strength is normal Sensory is normal Reflexes are normal Cranial Nerves are normal.  ASSESSMENT: HTN (hypertension) - Plan: BASIC METABOLIC PANEL WITH GFR, amLODipine (NORVASC) 5 MG tablet  Unspecified vitamin D deficiency - Plan: Vitamin D 25 hydroxy  Seasonal allergic rhinitis - Plan: fluticasone (FLONASE) 50 MCG/ACT nasal spray, fexofenadine (ALLEGRA) 180 MG tablet  PLAN:       Dr Woodroe Mode Recommendations  Diet and Exercise discussed with patient.  For nutrition information, I recommend books:  1).Eat to Live by Dr Monico Hoar. 2).Prevent and Reverse Heart Disease by Dr Suzzette Righter.  Exercise recommendations are:  If unable to walk, then the patient can exercise in a chair 3 times a day. By flapping arms like a bird gently and raising legs outwards to the front.  If ambulatory, the patient can go for walks for 30 minutes 3 times a week. Then increase the intensity and duration as tolerated.  Goal is to try to attain exercise frequency to 5 times a week.  If applicable: Best to perform resistance exercises (machines or weights) 2 days a week and cardio type exercises 3 days per week.  Orders Placed This Encounter  Procedures  . BASIC METABOLIC PANEL WITH GFR  . Vitamin D 25 hydroxy   No results found for this or any previous visit (from the past 24 hour(s)). Meds ordered this encounter  Medications  . DISCONTD: amLODipine (NORVASC) 5 MG tablet    Sig:  Take 5 mg by mouth daily.  Marland Kitchen amLODipine (NORVASC) 5 MG tablet    Sig: Take 1 tablet (5 mg total) by mouth daily.    Dispense:  30 tablet    Refill:  5  . fluticasone (FLONASE) 50  MCG/ACT nasal spray    Sig: Place 2 sprays into the nose daily.    Dispense:  16 g    Refill:  6  . fexofenadine (ALLEGRA) 180 MG tablet    Sig: Take 1 tablet (180 mg total) by mouth daily.    Dispense:  30 tablet    Refill:  5   Patient doing well to control the BP. Responsive to meds.  RTC 4months  Azarria Balint P. Modesto Charon, M.D.

## 2013-01-26 LAB — BASIC METABOLIC PANEL WITH GFR
BUN: 10 mg/dL (ref 6–23)
CO2: 25 mEq/L (ref 19–32)
Calcium: 9.2 mg/dL (ref 8.4–10.5)
Chloride: 106 mEq/L (ref 96–112)
Creat: 0.95 mg/dL (ref 0.50–1.10)
GFR, Est African American: 87 mL/min
GFR, Est Non African American: 76 mL/min
Glucose, Bld: 80 mg/dL (ref 70–99)
Potassium: 4.1 mEq/L (ref 3.5–5.3)
Sodium: 139 mEq/L (ref 135–145)

## 2013-01-26 LAB — VITAMIN D 25 HYDROXY (VIT D DEFICIENCY, FRACTURES): Vit D, 25-Hydroxy: 20 ng/mL — ABNORMAL LOW (ref 30–89)

## 2013-01-27 ENCOUNTER — Other Ambulatory Visit: Payer: Self-pay | Admitting: Family Medicine

## 2013-01-27 MED ORDER — VITAMIN D (ERGOCALCIFEROL) 1.25 MG (50000 UNIT) PO CAPS: 50000.0000 [IU] | ORAL_CAPSULE | ORAL | Status: DC

## 2013-02-02 ENCOUNTER — Other Ambulatory Visit: Payer: Self-pay | Admitting: Family Medicine

## 2013-03-11 ENCOUNTER — Other Ambulatory Visit: Payer: Self-pay | Admitting: Family Medicine

## 2013-05-09 ENCOUNTER — Other Ambulatory Visit: Payer: Self-pay | Admitting: Emergency Medicine

## 2013-05-11 ENCOUNTER — Other Ambulatory Visit: Payer: Self-pay | Admitting: Emergency Medicine

## 2013-05-28 ENCOUNTER — Ambulatory Visit: Payer: 59 | Admitting: Family Medicine

## 2013-06-14 ENCOUNTER — Ambulatory Visit: Payer: Self-pay | Admitting: Family Medicine

## 2013-06-21 ENCOUNTER — Ambulatory Visit: Payer: Self-pay | Admitting: Family Medicine

## 2013-06-25 ENCOUNTER — Ambulatory Visit: Payer: Self-pay | Admitting: Family Medicine

## 2013-07-05 ENCOUNTER — Encounter: Payer: Self-pay | Admitting: Family Medicine

## 2013-07-05 ENCOUNTER — Ambulatory Visit (INDEPENDENT_AMBULATORY_CARE_PROVIDER_SITE_OTHER): Payer: 59 | Admitting: Family Medicine

## 2013-07-05 VITALS — BP 118/82 | HR 86 | Temp 97.8°F | Ht 65.0 in | Wt 202.4 lb

## 2013-07-05 DIAGNOSIS — E559 Vitamin D deficiency, unspecified: Secondary | ICD-10-CM

## 2013-07-05 DIAGNOSIS — I10 Essential (primary) hypertension: Secondary | ICD-10-CM

## 2013-07-05 DIAGNOSIS — M549 Dorsalgia, unspecified: Secondary | ICD-10-CM

## 2013-07-05 MED ORDER — CYCLOBENZAPRINE HCL 10 MG PO TABS: 5.0000 mg | ORAL_TABLET | Freq: Three times a day (TID) | ORAL | Status: DC | PRN

## 2013-07-05 MED ORDER — AMLODIPINE BESYLATE 5 MG PO TABS: 5.0000 mg | ORAL_TABLET | Freq: Every day | ORAL | Status: DC

## 2013-07-05 MED ORDER — VITAMIN D (ERGOCALCIFEROL) 1.25 MG (50000 UNIT) PO CAPS: 50000.0000 [IU] | ORAL_CAPSULE | ORAL | Status: DC

## 2013-07-05 NOTE — Progress Notes (Signed)
  Subjective:    Patient ID: Sabrina [name], female    DOB: March 06, 1971, 42 y.o.   MRN: [id number]  HPI This 42 y.o. female presents for evaluation of 4 month follow up.  She Has hx of hypertension. She has some lumbar back pain that is bothering Her from an injury at work a month ago and she would like a refill on the flexeril  Review of Systems C/o back pain.   No chest pain, SOB, HA, dizziness, vision change, N/V, diarrhea, constipation, dysuria, urinary urgency or frequency, myalgias, arthralgias or rash.  Objective:   Physical Exam Vital signs noted  Well developed well nourished female.  HEENT - Head atraumatic Normocephalic                Eyes - PERRLA, Conjuctiva - clear Sclera- Clear EOMI                Ears - EAC's Wnl TM's Wnl Gross Hearing WNL                Nose - Nares patent                 Throat - oropharanx wnl Respiratory - Lungs CTA bilateral Cardiac - RRR S1 and S2 without murmur GI - Abdomen soft Nontender and bowel sounds active x 4 Extremities - No edema. Neuro - Grossly intact. MS - TTP Left LS muscle      Assessment & Plan:  Essential hypertension, benign - Plan: amLODipine (NORVASC) 5 MG tablet  HTN (hypertension) - Plan: amLODipine (NORVASC) 5 MG tablet  Back pain - Plan: cyclobenzaprine (FLEXERIL) 10 MG tablet  Unspecified vitamin D deficiency - Plan: Vitamin D, Ergocalciferol, (DRISDOL) 50000 UNITS CAPS capsule  Deatra Canter FNP

## 2013-07-05 NOTE — Patient Instructions (Addendum)

## 2013-07-18 ENCOUNTER — Other Ambulatory Visit: Payer: Self-pay | Admitting: Family Medicine

## 2013-07-18 DIAGNOSIS — M549 Dorsalgia, unspecified: Secondary | ICD-10-CM

## 2013-07-18 MED ORDER — BACLOFEN 20 MG PO TABS: 20.0000 mg | ORAL_TABLET | Freq: Three times a day (TID) | ORAL | Status: DC

## 2013-08-24 ENCOUNTER — Other Ambulatory Visit: Payer: Self-pay | Admitting: Family Medicine

## 2014-02-28 ENCOUNTER — Telehealth: Payer: Self-pay | Admitting: Family Medicine

## 2014-02-28 DIAGNOSIS — I10 Essential (primary) hypertension: Secondary | ICD-10-CM

## 2014-02-28 MED ORDER — AMLODIPINE BESYLATE 5 MG PO TABS: 5.0000 mg | ORAL_TABLET | Freq: Every day | ORAL | Status: DC

## 2014-02-28 NOTE — Telephone Encounter (Signed)
done

## 2014-03-03 ENCOUNTER — Other Ambulatory Visit: Payer: Self-pay | Admitting: Family Medicine

## 2014-03-20 ENCOUNTER — Telehealth: Payer: Self-pay | Admitting: Family Medicine

## 2014-03-20 NOTE — Telephone Encounter (Signed)
appt scheduled

## 2014-03-26 ENCOUNTER — Encounter: Payer: Self-pay | Admitting: Family Medicine

## 2014-03-26 ENCOUNTER — Encounter (INDEPENDENT_AMBULATORY_CARE_PROVIDER_SITE_OTHER): Payer: Self-pay

## 2014-03-26 ENCOUNTER — Ambulatory Visit (INDEPENDENT_AMBULATORY_CARE_PROVIDER_SITE_OTHER): Payer: 59 | Admitting: Family Medicine

## 2014-03-26 ENCOUNTER — Ambulatory Visit: Payer: 59 | Admitting: Family Medicine

## 2014-03-26 VITALS — BP 125/78 | HR 106 | Temp 99.1°F | Ht 65.0 in | Wt 217.4 lb

## 2014-03-26 DIAGNOSIS — E559 Vitamin D deficiency, unspecified: Secondary | ICD-10-CM

## 2014-03-26 DIAGNOSIS — I10 Essential (primary) hypertension: Secondary | ICD-10-CM

## 2014-03-26 MED ORDER — AMLODIPINE BESYLATE 5 MG PO TABS: 5.0000 mg | ORAL_TABLET | Freq: Every day | ORAL | Status: DC

## 2014-03-26 MED ORDER — VITAMIN D (ERGOCALCIFEROL) 1.25 MG (50000 UNIT) PO CAPS: 50000.0000 [IU] | ORAL_CAPSULE | ORAL | Status: DC

## 2014-03-26 NOTE — Progress Notes (Signed)
   Subjective:    Patient ID: Sabrina [name], female    DOB: December 29, 1970, 43 y.o.   MRN: [id number]  HPI This 43 y.o. female presents for evaluation of routine follow up.  She has had labs in 4/15 and showed Low vitamin D.  She has been inactive for last 6 months and she has gained weight.  She is back to Work now.  She is c/o weight gain.   Review of Systems C/o weight gain. No chest pain, SOB, HA, dizziness, vision change, N/V, diarrhea, constipation, dysuria, urinary urgency or frequency, myalgias, arthralgias or rash.     Objective:   Physical Exam  Vital signs noted  Well developed well nourished female.  HEENT - Head atraumatic Normocephalic                Eyes - PERRLA, Conjuctiva - clear Sclera- Clear EOMI                Ears - EAC's Wnl TM's Wnl Gross Hearing WNL                 Throat - oropharanx wnl Respiratory - Lungs CTA bilateral Cardiac - RRR S1 and S2 without murmur GI - Abdomen soft Nontender and bowel sounds active x 4 Extremities - No edema.      Assessment & Plan:  Unspecified vitamin D deficiency - Plan: amLODipine (NORVASC) 5 MG tablet, Vitamin D, Ergocalciferol, (DRISDOL) 50000 UNITS CAPS capsule  Essential hypertension, benign - Plan: amLODipine (NORVASC) 5 MG tablet, Vitamin D, Ergocalciferol, (DRISDOL) 50000 UNITS CAPS capsule  Weight Gain - Discussed with patient to exercise and to be active since she has deconditioning for sitting around for last 6 months.  Lysbeth Penner FNP

## 2014-07-16 ENCOUNTER — Other Ambulatory Visit: Payer: Self-pay | Admitting: Nurse Practitioner

## 2014-12-17 ENCOUNTER — Ambulatory Visit (INDEPENDENT_AMBULATORY_CARE_PROVIDER_SITE_OTHER): Payer: BLUE CROSS/BLUE SHIELD | Admitting: Family Medicine

## 2014-12-17 VITALS — BP 140/80 | HR 82 | Temp 97.4°F | Resp 16 | Ht 65.75 in | Wt 207.0 lb

## 2014-12-17 DIAGNOSIS — Z0000 Encounter for general adult medical examination without abnormal findings: Secondary | ICD-10-CM | POA: Diagnosis not present

## 2014-12-17 DIAGNOSIS — I10 Essential (primary) hypertension: Secondary | ICD-10-CM

## 2014-12-17 DIAGNOSIS — Z131 Encounter for screening for diabetes mellitus: Secondary | ICD-10-CM | POA: Diagnosis not present

## 2014-12-17 DIAGNOSIS — M5489 Other dorsalgia: Secondary | ICD-10-CM

## 2014-12-17 DIAGNOSIS — E669 Obesity, unspecified: Secondary | ICD-10-CM | POA: Diagnosis not present

## 2014-12-17 LAB — COMPREHENSIVE METABOLIC PANEL
ALBUMIN: 4.1 g/dL (ref 3.5–5.2)
ALK PHOS: 50 U/L (ref 39–117)
ALT: 37 U/L — ABNORMAL HIGH (ref 0–35)
AST: 22 U/L (ref 0–37)
BUN: 8 mg/dL (ref 6–23)
CALCIUM: 9 mg/dL (ref 8.4–10.5)
CHLORIDE: 104 meq/L (ref 96–112)
CO2: 24 meq/L (ref 19–32)
Creat: 0.91 mg/dL (ref 0.50–1.10)
GLUCOSE: 77 mg/dL (ref 70–99)
POTASSIUM: 3.7 meq/L (ref 3.5–5.3)
SODIUM: 138 meq/L (ref 135–145)
TOTAL PROTEIN: 6.7 g/dL (ref 6.0–8.3)
Total Bilirubin: 0.4 mg/dL (ref 0.2–1.2)

## 2014-12-17 LAB — POCT CBC
GRANULOCYTE PERCENT: 68.4 % (ref 37–80)
HEMATOCRIT: 38.6 % (ref 37.7–47.9)
HEMOGLOBIN: 11.6 g/dL — AB (ref 12.2–16.2)
Lymph, poc: 1.3 (ref 0.6–3.4)
MCH, POC: 28.3 pg (ref 27–31.2)
MCHC: 30.1 g/dL — AB (ref 31.8–35.4)
MCV: 94.1 fL (ref 80–97)
MID (cbc): 0.3 (ref 0–0.9)
MPV: 5.9 fL (ref 0–99.8)
POC GRANULOCYTE: 3.4 (ref 2–6.9)
POC LYMPH PERCENT: 25.8 %L (ref 10–50)
POC MID %: 5.8 %M (ref 0–12)
Platelet Count, POC: 304 10*3/uL (ref 142–424)
RBC: 4.1 M/uL (ref 4.04–5.48)
RDW, POC: 12.5 %
WBC: 4.9 10*3/uL (ref 4.6–10.2)

## 2014-12-17 LAB — LIPID PANEL
Cholesterol: 193 mg/dL (ref 0–200)
HDL: 53 mg/dL (ref >=46)
LDL CALC: 115 mg/dL — AB (ref 0–99)
TRIGLYCERIDES: 127 mg/dL (ref <150)
Total CHOL/HDL Ratio: 3.6 Ratio
VLDL: 25 mg/dL (ref 0–40)

## 2014-12-17 LAB — POCT GLYCOSYLATED HEMOGLOBIN (HGB A1C): Hemoglobin A1C: 4.7

## 2014-12-17 LAB — TSH: TSH: 1.205 u[IU]/mL (ref 0.350–4.500)

## 2014-12-17 MED ORDER — METHOCARBAMOL 500 MG PO TABS: 500.0000 mg | ORAL_TABLET | Freq: Three times a day (TID) | ORAL | Status: DC

## 2014-12-17 NOTE — Patient Instructions (Signed)
Take the methocarbamol one pill 3 times daily if needed for muscle relaxant for back. Can take 2 at bedtime if necessary.  Continue using an over-the-counter anti-inflammatory medicine such as Aleve twice daily for the pain if needed  Continue your blood pressure and allergy medications  Work hard on trying to slowly lose some weight by modifying your eating habits.  Return in one year or as needed

## 2014-12-17 NOTE — Progress Notes (Signed)
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 No major acute complaints. She has not had any labs checked for a long time. She sees a gynecologist regular.  Past history: Operations: Cesarean section and a tubal pregnancy Medical illnesses: Hypertension Regular medications: See list Allergies: None known  Social history: Married, 41 child 42 years old. Patient works. Not doing a lot of regular exercise currently goes to the gym some.  Family history: Family history positive for diabetes in her mother  Review of systems: Constitutional: Unremarkable HEENT: Unremarkable Cardiovascular: Unremarkable Respiratory: Unremarkable GI: Unremarkable GU: Unremarkable Muscular skeletal: Has been having some back pains and would like something for that. Take some OTC pain relievers. Endocrine: Unremarkable Neurologic: Unremarkable Psychiatric: Unremarkable Dermatologic: Unremarkable   Physical exam Overweight lady pleasant alert and oriented in no acute distress. Her TMs are normal. Eyes PERRLA. Throat was clear. Neck supple without nodes or thyromegaly. No carotid bruits. Chest is clear. Heart regular without murmurs gallops or arrhythmias. Abdomen soft without mass or tenderness. Pelvic and breast exam not done. Extremities unremarkable. Skin normal. Has some tenderness in her back.  Assessment: Physical examination Hypertension Low back pain  Plan: Continue her blood pressure medication. Advised that she work hard on weight loss. Had a long talk about making wise choices in life to be healthy.  Treated her back with some methocarbamol for muscle accident. If it continues given problems will need to assess further.  Stomach    Results for orders placed or performed in visit on 12/17/14  POCT CBC  Result Value Ref Range   WBC 4.9 4.6 - 10.2 K/uL   Lymph, poc 1.3 0.6 - 3.4   POC LYMPH PERCENT 25.8 10 - 50 %L   MID (cbc) 0.3 0 - 0.9   POC MID % 5.8 0 - 12 %M   POC Granulocyte 3.4 2 - 6.9   Granulocyte percent 68.4 37 - 80 %G   RBC 4.10 4.04 - 5.48 M/uL   Hemoglobin 11.6 (A) 12.2 - 16.2 g/dL   HCT, POC 38.6 37.7 - 47.9 %   MCV 94.1 80 - 97 fL   MCH, POC 28.3 27 - 31.2 pg   MCHC 30.1 (A) 31.8 - 35.4 g/dL   RDW, POC 12.5 %   Platelet Count, POC 304 142 - 424 K/uL   MPV 5.9 0 - 99.8 fL  POCT glycosylated hemoglobin (Hb A1C)  Result Value Ref Range   Hemoglobin A1C 4.7

## 2014-12-18 ENCOUNTER — Encounter: Payer: Self-pay | Admitting: Family Medicine

## 2015-04-23 ENCOUNTER — Other Ambulatory Visit: Payer: Self-pay

## 2015-04-23 DIAGNOSIS — E559 Vitamin D deficiency, unspecified: Secondary | ICD-10-CM

## 2015-04-23 DIAGNOSIS — I10 Essential (primary) hypertension: Secondary | ICD-10-CM

## 2015-05-07 ENCOUNTER — Encounter (INDEPENDENT_AMBULATORY_CARE_PROVIDER_SITE_OTHER): Payer: Self-pay

## 2015-05-07 ENCOUNTER — Ambulatory Visit (INDEPENDENT_AMBULATORY_CARE_PROVIDER_SITE_OTHER): Payer: BLUE CROSS/BLUE SHIELD | Admitting: Physician Assistant

## 2015-05-07 VITALS — BP 142/99 | HR 77 | Temp 98.3°F | Ht 65.75 in | Wt 209.0 lb

## 2015-05-07 DIAGNOSIS — I10 Essential (primary) hypertension: Secondary | ICD-10-CM | POA: Diagnosis not present

## 2015-05-07 MED ORDER — AMLODIPINE BESYLATE 5 MG PO TABS: 5.0000 mg | ORAL_TABLET | Freq: Every day | ORAL | Status: DC

## 2015-05-07 NOTE — Progress Notes (Signed)
   Subjective:    Patient ID: Sabrina [name], female    DOB: Aug 08, 1971, 44 y.o.   MRN: [id number]  HPI 44 y/o female with HTN presents for refill on her antihypertensive. She had labs in March, which were WNL except for mild anemia. She has not her antihypertensive in 2 weeks. Asymptomatic     Review of Systems  Constitutional: Negative.   HENT: Negative.   Eyes: Negative.   Respiratory: Negative.   Cardiovascular: Negative.   Gastrointestinal: Negative.   Endocrine: Negative.   Genitourinary: Negative.   Musculoskeletal: Negative.   Skin: Negative.   Allergic/Immunologic: Negative.   Neurological: Negative.   Hematological: Negative.   Psychiatric/Behavioral: Negative.        Objective:   Physical Exam  Constitutional: She is oriented to person, place, and time. She appears well-developed and well-nourished. No distress.  HENT:  Head: Normocephalic.  Cardiovascular: Normal rate, regular rhythm, normal heart sounds and intact distal pulses.  Exam reveals no gallop and no friction rub.   No murmur heard. Hypertensive   Pulmonary/Chest: Effort normal and breath sounds normal. No respiratory distress. She has no wheezes. She has no rales. She exhibits no tenderness.  Neurological: She is alert and oriented to person, place, and time.  Skin: She is not diaphoretic.  Psychiatric: She has a normal mood and affect. Her behavior is normal. Judgment and thought content normal.          Assessment & Plan:  1. Essential hypertension  - amLODipine (NORVASC) 5 MG tablet; Take 1 tablet (5 mg total) by mouth daily.  Dispense: 30 tablet; Refill: 5  2. Essential hypertension, benign  - amLODipine (NORVASC) 5 MG tablet; Take 1 tablet (5 mg total) by mouth daily.  Dispense: 30 tablet; Refill: 5   RTO 6 months   Miosha Behe A. Benjamin Stain PA-C

## 2015-05-15 ENCOUNTER — Telehealth: Payer: Self-pay | Admitting: Physician Assistant

## 2015-07-06 ENCOUNTER — Emergency Department (HOSPITAL_COMMUNITY)
Admission: EM | Admit: 2015-07-06 | Discharge: 2015-07-06 | Disposition: A | Discharge status: Home / Self Care | Payer: BLUE CROSS/BLUE SHIELD | Source: Home / Self Care | Attending: Family Medicine | Admitting: Family Medicine

## 2015-07-06 ENCOUNTER — Encounter (HOSPITAL_COMMUNITY): Payer: Self-pay | Admitting: *Deleted

## 2015-07-06 DIAGNOSIS — K2990 Gastroduodenitis, unspecified, without bleeding: Secondary | ICD-10-CM

## 2015-07-06 MED ORDER — GI COCKTAIL ~~LOC~~
ORAL | Status: AC
  Filled 2015-07-06: qty 30

## 2015-07-06 MED ORDER — GI COCKTAIL ~~LOC~~
30.0000 mL | Freq: Once | ORAL | Status: AC
  Administered 2015-07-06: 30 mL via ORAL

## 2015-07-06 MED ORDER — RANITIDINE HCL 150 MG PO TABS: 150.0000 mg | ORAL_TABLET | Freq: Two times a day (BID) | ORAL | Status: DC

## 2015-07-06 NOTE — ED Provider Notes (Signed)
CSN: [id number]     Arrival date & time 07/06/15  1302 History   First MD Initiated Contact with Patient 07/06/15 1324     Chief Complaint  Patient presents with  . Abdominal Pain   (Consider location/radiation/quality/duration/timing/severity/associated sxs/prior Treatment) Patient is a 44 y.o. female presenting with abdominal pain. The history is provided by the patient.  Abdominal Pain Pain location:  Epigastric Pain quality: burning   Pain severity:  Mild Onset quality:  Sudden Duration:  2 days Progression:  Partially resolved Chronicity:  New Context: eating   Context comment:  Sx onset 1hr after eating. Relieved by:  None tried Worsened by:  Nothing tried (under a lot of stress at work.) Associated symptoms: nausea   Associated symptoms: no diarrhea, no fever, no hematochezia, no melena and no vomiting     Past Medical History  Diagnosis Date  . Hypertension    Past Surgical History  Procedure Laterality Date  . Cesarean section    . Ectopic pregnancy surgery     Family History  Problem Relation Age of Onset  . Pulmonary embolism Sister   . Hypertension Other   . Diabetes Other   . Diabetes Mother   . Hypertension Mother    Social History  Substance Use Topics  . Smoking status: Never Smoker   . Smokeless tobacco: None  . Alcohol Use: No   OB History    No data available     Review of Systems  Constitutional: Positive for appetite change. Negative for fever.  Gastrointestinal: Positive for nausea and abdominal pain. Negative for vomiting, diarrhea, blood in stool, melena, hematochezia and anal bleeding.  All other systems reviewed and are negative.   Allergies  Review of patient's allergies indicates no known allergies.  Home Medications   Prior to Admission medications   Medication Sig Start Date End Date Taking? Authorizing Provider  amLODipine (NORVASC) 5 MG tablet Take 1 tablet (5 mg total) by mouth daily. 05/07/15   Tiffany A Gann, PA-C   methocarbamol (ROBAXIN) 500 MG tablet Take 1 tablet (500 mg total) by mouth 3 (three) times daily. Patient not taking: Reported on 05/07/2015 12/17/14   Posey Boyer, MD  naproxen (NAPROSYN) 500 MG tablet Take 500 mg by mouth 2 (two) times daily with a meal. As needed    Historical Provider, MD  ranitidine (ZANTAC) 150 MG tablet Take 1 tablet (150 mg total) by mouth 2 (two) times daily. 07/06/15   Billy Fischer, MD   Meds Ordered and Administered this Visit   Medications  gi cocktail (Maalox,Lidocaine,Donnatal) (30 mLs Oral Given 07/06/15 1341)    BP 131/95 mmHg  Pulse 74  Temp(Src) 98.1 F (36.7 C) (Oral)  Resp 16  SpO2 100%  LMP 06/10/2015 No data found.   Physical Exam  Constitutional: She is oriented to person, place, and time. She appears well-developed and well-nourished.  HENT:  Mouth/Throat: Oropharynx is clear and moist.  Neck: Normal range of motion. Neck supple.  Abdominal: Soft. Bowel sounds are normal. She exhibits no distension and no mass. There is tenderness. There is no rebound and no guarding.  Lymphadenopathy:    She has no cervical adenopathy.  Neurological: She is alert and oriented to person, place, and time.  Skin: Skin is warm and dry.  Nursing note and vitals reviewed.   ED Course  Procedures (including critical care time)  Labs Review Labs Reviewed - No data to display  Imaging Review No results found.   Visual  Acuity Review  Right Eye Distance:   Left Eye Distance:   Bilateral Distance:    Right Eye Near:   Left Eye Near:    Bilateral Near:         MDM   1. Gastritis and duodenitis        Billy Fischer, MD 07/06/15 1344

## 2015-07-06 NOTE — ED Notes (Signed)
Pt  Reports   Symptoms    Of   abd  Pain        Nausea     Loose   Stool           Symptoms  X  4  Days        Pt  Sitting upright on  The   Exam table  In no  Acute  Distress

## 2015-07-22 ENCOUNTER — Telehealth: Payer: Self-pay | Admitting: Family Medicine

## 2015-11-21 ENCOUNTER — Other Ambulatory Visit: Payer: Self-pay | Admitting: Physician Assistant

## 2015-12-23 ENCOUNTER — Other Ambulatory Visit: Payer: Self-pay | Admitting: Family Medicine

## 2015-12-23 NOTE — Telephone Encounter (Signed)
Please give this patient an appointment to be seen by a provider. The prescription can be refilled 1.

## 2015-12-23 NOTE — Telephone Encounter (Signed)
Last seen 05/07/15 Sabrina [name]  PCP

## 2016-01-13 ENCOUNTER — Encounter (INDEPENDENT_AMBULATORY_CARE_PROVIDER_SITE_OTHER): Payer: Self-pay

## 2016-01-13 ENCOUNTER — Other Ambulatory Visit: Payer: Self-pay | Admitting: Family Medicine

## 2016-01-13 ENCOUNTER — Ambulatory Visit (INDEPENDENT_AMBULATORY_CARE_PROVIDER_SITE_OTHER): Payer: BLUE CROSS/BLUE SHIELD | Admitting: Nurse Practitioner

## 2016-01-13 ENCOUNTER — Encounter: Payer: Self-pay | Admitting: Nurse Practitioner

## 2016-01-13 VITALS — BP 137/94 | HR 74 | Temp 98.2°F | Ht 65.75 in | Wt 208.0 lb

## 2016-01-13 DIAGNOSIS — M5489 Other dorsalgia: Secondary | ICD-10-CM

## 2016-01-13 MED ORDER — NAPROXEN 500 MG PO TABS: 500.0000 mg | ORAL_TABLET | Freq: Two times a day (BID) | ORAL | Status: DC

## 2016-01-13 MED ORDER — METHOCARBAMOL 500 MG PO TABS: 500.0000 mg | ORAL_TABLET | Freq: Three times a day (TID) | ORAL | Status: DC

## 2016-01-13 NOTE — Progress Notes (Signed)
   Subjective:    Patient ID: Sabrina [name], female    DOB: 19-Jul-1971, 45 y.o.   MRN: [id number]  HPI Patient in today c/o back pain - she has this occur off and on for couple of years- currently the only medication she has is OTC tylenol, ibuprofen and patches from [facility]- she does a lot of heavy lifting at work which irritates her back. 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    Review of Systems  Respiratory: Negative.   Cardiovascular: Negative.   Gastrointestinal: Negative.   Genitourinary: Negative.   Musculoskeletal: Positive for back pain.  Neurological: Negative.   Psychiatric/Behavioral: Negative.   All other systems reviewed and are negative.      Objective:   Physical Exam  Constitutional: She is oriented to person, place, and time. She appears well-developed and well-nourished. No distress.  Cardiovascular: Normal rate, regular rhythm and normal heart sounds.   Pulmonary/Chest: Effort normal and breath sounds normal.  Musculoskeletal:  Thoracic back pain on palpation. FROM of neck Motor strength and sensation of upper ext intact  Neurological: She is alert and oriented to person, place, and time.  Skin: Skin is warm and dry.  Psychiatric: She has a normal mood and affect. Her behavior is normal. Judgment and thought content normal.    BP 137/94 mmHg  Pulse 74  Temp(Src) 98.2 F (36.8 C) (Oral)  Ht 5' 5.75" (1.67 m)  Wt 208 lb (94.348 kg)  BMI 33.83 kg/m2      Assessment & Plan:   1. Midline back pain, unspecified location    Moist heat  Good body mechanics when lifting Meds ordered this encounter  Medications  . methocarbamol (ROBAXIN) 500 MG tablet    Sig: Take 1 tablet (500 mg total) by mouth 3 (three) times daily.    Dispense:  40 tablet    Refill:  0    Order Specific Question:  Supervising  Provider    Answer:  Chipper Herb [[id number]]  . naproxen (NAPROSYN) 500 MG tablet    Sig: Take 1 tablet (500 mg total) by mouth 2 (two) times daily with a meal. Reported on 01/13/2016    Dispense:  60 tablet    Refill:  1    Order Specific Question:  Supervising Provider    Answer:  Chipper Herb [[id number]]   RTO prn  Mary-Margaret Hassell Done, FNP

## 2016-01-13 NOTE — Patient Instructions (Signed)

## 2016-02-11 ENCOUNTER — Ambulatory Visit (INDEPENDENT_AMBULATORY_CARE_PROVIDER_SITE_OTHER): Payer: BLUE CROSS/BLUE SHIELD | Admitting: Family Medicine

## 2016-02-11 ENCOUNTER — Encounter: Payer: Self-pay | Admitting: Family Medicine

## 2016-02-11 VITALS — BP 123/88 | HR 91 | Temp 98.5°F | Ht 65.73 in | Wt 203.2 lb

## 2016-02-11 DIAGNOSIS — I10 Essential (primary) hypertension: Secondary | ICD-10-CM

## 2016-02-11 DIAGNOSIS — Z0000 Encounter for general adult medical examination without abnormal findings: Secondary | ICD-10-CM

## 2016-02-11 NOTE — Progress Notes (Signed)
   HPI  Patient presents today for physical exam.  Patient explains that she is in good health, she has some bilateral shoulder pain which she deals with by using approximately 2-500 mg naproxen per week. She also uses Robaxin at the same time.  She does not exercise regularly but is planning to start, she's been watching her diet lately cutting out sodas, however she does have several dietary indiscretions.  Hypertension No chest pain, dyspnea, palpitations, leg edema. She takes amlodipine daily. She plans to become more active.  She had her last Pap smear within 3 years.  PMH: Smoking status noted ROS: Per HPI  Objective: BP 123/88 mmHg  Pulse 91  Temp(Src) 98.5 F (36.9 C) (Oral)  Ht 5' 5.73" (1.67 m)  Wt 203 lb 3.2 oz (92.171 kg)  BMI 33.05 kg/m2 Gen: NAD, alert, cooperative with exam HEENT: NCAT, EOMI, PERRL, nares clear, TMs normal bilaterally, oropharynx clear CV: RRR, good S1/S2, no murmur Resp: CTABL, no wheezes, non-labored Abd: SNTND, BS present, no guarding or organomegaly Ext: No edema, warm Neuro: Alert and oriented, strength 5/5 and sensation intact in bilateral lower extremities  Assessment and plan:  # Annual physical exam Normal exam Overweight, discussed diet and exercise strategies. Basic labs ordered, urinalysis recommended by her employer so I went ahead and check that as well. Previous A1c was 4.7 Return to clinic per usual routine  # Hypertension Blood pressure very well controlled Continue amlodipine   Orders Placed This Encounter  Procedures  . Urinalysis, Complete    Standing Status: Future     Number of Occurrences:      Standing Expiration Date: 02/10/2017  . CMP14+EGFR    Standing Status: Future     Number of Occurrences:      Standing Expiration Date: 02/10/2017  . CBC with Differential/Platelet    Standing Status: Future     Number of Occurrences:      Standing Expiration Date: 02/10/2017  . TSH    Standing Status: Future    Number of Occurrences:      Standing Expiration Date: 02/10/2017  . Lipid panel    Standing Status: Future     Number of Occurrences:      Standing Expiration Date: 02/10/2017  . VITAMIN D 25 Hydroxy (Vit-D Deficiency, Fractures)    Standing Status: Future     Number of Occurrences:      Standing Expiration Date: 02/10/2017    Laroy Apple, MD [facility] 02/11/2016, 2:43 PM

## 2016-02-11 NOTE — Patient Instructions (Signed)
Great to meet you!  Make a lab appt for fasting labs.   We will call within 1 week of the labs.   Be sure to get your mammograms each year.

## 2016-02-15 ENCOUNTER — Other Ambulatory Visit: Payer: BLUE CROSS/BLUE SHIELD

## 2016-02-15 DIAGNOSIS — Z0000 Encounter for general adult medical examination without abnormal findings: Secondary | ICD-10-CM

## 2016-02-15 LAB — URINALYSIS, COMPLETE
Bilirubin, UA: NEGATIVE
GLUCOSE, UA: NEGATIVE
KETONES UA: NEGATIVE
NITRITE UA: NEGATIVE
SPEC GRAV UA: 1.025 (ref 1.005–1.030)
UUROB: 0.2 mg/dL (ref 0.2–1.0)
pH, UA: 6 (ref 5.0–7.5)

## 2016-02-15 LAB — MICROSCOPIC EXAMINATION

## 2016-02-16 ENCOUNTER — Other Ambulatory Visit: Payer: Self-pay | Admitting: Family Medicine

## 2016-02-16 LAB — CBC WITH DIFFERENTIAL/PLATELET
BASOS: 1 %
Basophils Absolute: 0 10*3/uL (ref 0.0–0.2)
EOS (ABSOLUTE): 0.1 10*3/uL (ref 0.0–0.4)
Eos: 1 %
HEMATOCRIT: 37.7 % (ref 34.0–46.6)
HEMOGLOBIN: 13 g/dL (ref 11.1–15.9)
IMMATURE GRANS (ABS): 0 10*3/uL (ref 0.0–0.1)
Immature Granulocytes: 0 %
LYMPHS: 37 %
Lymphocytes Absolute: 1.3 10*3/uL (ref 0.7–3.1)
MCH: 30.6 pg (ref 26.6–33.0)
MCHC: 34.5 g/dL (ref 31.5–35.7)
MCV: 89 fL (ref 79–97)
MONOCYTES: 8 %
Monocytes Absolute: 0.3 10*3/uL (ref 0.1–0.9)
NEUTROS ABS: 1.9 10*3/uL (ref 1.4–7.0)
Neutrophils: 53 %
PLATELETS: 294 10*3/uL (ref 150–379)
RBC: 4.25 x10E6/uL (ref 3.77–5.28)
RDW: 11.5 % — AB (ref 12.3–15.4)
WBC: 3.6 10*3/uL (ref 3.4–10.8)

## 2016-02-16 LAB — CMP14+EGFR
A/G RATIO: 1.8 (ref 1.2–2.2)
ALT: 18 IU/L (ref 0–32)
AST: 14 IU/L (ref 0–40)
Albumin: 4.2 g/dL (ref 3.5–5.5)
Alkaline Phosphatase: 57 IU/L (ref 39–117)
BUN/Creatinine Ratio: 9 (ref 9–23)
BUN: 8 mg/dL (ref 6–24)
Bilirubin Total: 0.5 mg/dL (ref 0.0–1.2)
CALCIUM: 9.5 mg/dL (ref 8.7–10.2)
CO2: 18 mmol/L (ref 18–29)
Chloride: 104 mmol/L (ref 96–106)
Creatinine, Ser: 0.94 mg/dL (ref 0.57–1.00)
GFR, EST AFRICAN AMERICAN: 87 mL/min/{1.73_m2} (ref >59)
GFR, EST NON AFRICAN AMERICAN: 75 mL/min/{1.73_m2} (ref >59)
Globulin, Total: 2.3 g/dL (ref 1.5–4.5)
Glucose: 79 mg/dL (ref 65–99)
POTASSIUM: 4.3 mmol/L (ref 3.5–5.2)
Sodium: 143 mmol/L (ref 134–144)
TOTAL PROTEIN: 6.5 g/dL (ref 6.0–8.5)

## 2016-02-16 LAB — LIPID PANEL
CHOLESTEROL TOTAL: 190 mg/dL (ref 100–199)
Chol/HDL Ratio: 3.5 ratio units (ref 0.0–4.4)
HDL: 54 mg/dL (ref >39)
LDL Calculated: 123 mg/dL — ABNORMAL HIGH (ref 0–99)
TRIGLYCERIDES: 63 mg/dL (ref 0–149)
VLDL CHOLESTEROL CAL: 13 mg/dL (ref 5–40)

## 2016-02-16 LAB — TSH: TSH: 2 u[IU]/mL (ref 0.450–4.500)

## 2016-02-16 LAB — VITAMIN D 25 HYDROXY (VIT D DEFICIENCY, FRACTURES): VIT D 25 HYDROXY: 15.4 ng/mL — AB (ref 30.0–100.0)

## 2016-02-16 MED ORDER — VITAMIN D (ERGOCALCIFEROL) 1.25 MG (50000 UNIT) PO CAPS: 50000.0000 [IU] | ORAL_CAPSULE | ORAL | Status: DC

## 2016-04-23 ENCOUNTER — Other Ambulatory Visit: Payer: Self-pay | Admitting: Family Medicine

## 2016-04-23 DIAGNOSIS — E559 Vitamin D deficiency, unspecified: Secondary | ICD-10-CM

## 2016-04-25 DIAGNOSIS — E559 Vitamin D deficiency, unspecified: Secondary | ICD-10-CM | POA: Insufficient documentation

## 2016-04-25 NOTE — Telephone Encounter (Signed)
Refilled, will request that patient rechecks vitamin D.  After this round of her vitamin D is replaced will transition to 2000 international units of vitamin D daily.   Laroy Apple, MD [facility] 04/25/2016, 12:15 PM

## 2016-05-09 ENCOUNTER — Ambulatory Visit (INDEPENDENT_AMBULATORY_CARE_PROVIDER_SITE_OTHER): Payer: BLUE CROSS/BLUE SHIELD | Admitting: Family Medicine

## 2016-05-09 ENCOUNTER — Encounter: Payer: Self-pay | Admitting: Family Medicine

## 2016-05-09 VITALS — BP 129/83 | HR 84 | Temp 98.5°F | Ht 65.75 in | Wt 200.6 lb

## 2016-05-09 DIAGNOSIS — J0100 Acute maxillary sinusitis, unspecified: Secondary | ICD-10-CM | POA: Diagnosis not present

## 2016-05-09 MED ORDER — AMOXICILLIN-POT CLAVULANATE 875-125 MG PO TABS: 1.0000 | ORAL_TABLET | Freq: Two times a day (BID) | ORAL | 0 refills | Status: DC

## 2016-05-09 NOTE — Progress Notes (Signed)
   HPI  Patient presents today here with concern for sinus infection.  Patient explains that her last 3 weeks she's had intermittent left frontal sinus pain and tenderness. Over the last 7-10 days she's had more persistent pain and also pain in her left maxillary sinus, she's also had congestion, cough, and subjective fever and chills.  She denies any difficulty tolerating food or fluids, dyspnea, or chest pain.  PMH: Smoking status noted ROS: Per HPI  Objective: BP 129/83   Pulse 84   Temp 98.5 F (36.9 C) (Oral)   Ht 5' 5.75" (1.67 m)   Wt 200 lb 9.6 oz (91 kg)   BMI 32.62 kg/m  Gen: NAD, alert, cooperative with exam HEENT: NCAT, left-sided maxillary tenderness to palpation  CV: RRR, good S1/S2, no murmur Resp: CTABL, no wheezes, non-labored Ext: No edema, warm Neuro: Alert and oriented, No gross deficits  Assessment and plan:  # Acute maxillary sinusitis Treat with Augmentin Continue Zyrtec Return to clinic as needed   Meds ordered this encounter  Medications  . amoxicillin-clavulanate (AUGMENTIN) 875-125 MG tablet    Sig: Take 1 tablet by mouth 2 (two) times daily.    Dispense:  20 tablet    Refill:  0    Laroy Apple, MD [facility] 05/09/2016, 6:42 PM

## 2016-05-09 NOTE — Patient Instructions (Signed)
Great to meet you!  Take all antibiotics, take pro-biotics twice daily while you are on them.   Continue zyrtec (certirizine)  for at least a week  Sinusitis, Adult Sinusitis is redness, soreness, and puffiness (inflammation) of the air pockets in the bones of your face (sinuses). The redness, soreness, and puffiness can cause air and mucus to get trapped in your sinuses. This can allow germs to grow and cause an infection.  HOME CARE   Drink enough fluids to keep your pee (urine) clear or pale yellow.  Use a humidifier in your home.  Run a hot shower to create steam in the bathroom. Sit in the bathroom with the door closed. Breathe in the steam 3-4 times a day.  Put a warm, moist washcloth on your face 3-4 times a day, or as told by your doctor.  Use salt water sprays (saline sprays) to wet the thick fluid in your nose. This can help the sinuses drain.  Only take medicine as told by your doctor. GET HELP RIGHT AWAY IF:   Your pain gets worse.  You have very bad headaches.  You are sick to your stomach (nauseous).  You throw up (vomit).  You are very sleepy (drowsy) all the time.  Your face is puffy (swollen).  Your vision changes.  You have a stiff neck.  You have trouble breathing. MAKE SURE YOU:   Understand these instructions.  Will watch your condition.  Will get help right away if you are not doing well or get worse.   This information is not intended to replace advice given to you by your health care provider. Make sure you discuss any questions you have with your health care provider.   Document Released: 03/07/2008 Document Revised: 10/10/2014 Document Reviewed: 04/24/2012 Elsevier Interactive Patient Education [employer].

## 2016-05-13 ENCOUNTER — Telehealth: Payer: Self-pay | Admitting: Family Medicine

## 2016-05-13 MED ORDER — FLUCONAZOLE 150 MG PO TABS: 150.0000 mg | ORAL_TABLET | Freq: Once | ORAL | 0 refills | Status: AC

## 2016-05-13 NOTE — Telephone Encounter (Signed)
Diflucan sent.   Laroy Apple, MD [facility] 05/13/2016, 12:27 PM

## 2016-05-13 NOTE — Telephone Encounter (Signed)
Please review and advise.

## 2016-05-13 NOTE — Telephone Encounter (Signed)
Patient aware.

## 2016-05-23 ENCOUNTER — Other Ambulatory Visit: Payer: Self-pay | Admitting: Family Medicine

## 2016-05-24 MED ORDER — FLUCONAZOLE 150 MG PO TABS: 150.0000 mg | ORAL_TABLET | Freq: Once | ORAL | 0 refills | Status: AC

## 2016-05-24 NOTE — Telephone Encounter (Signed)
Patient returned my call. She was given 2 tablets of Diflucan with the Augmentin because she complained of vaginal irritation. She took the first tablet with the start of the antibiotic and the second tablet 3 days later. Now that she has completed the antibiotic she continues to have vaginal irritation and feels that the yeast infection did not clear. She is requesting a new prescription for Diflucan.

## 2016-05-24 NOTE — Telephone Encounter (Signed)
I see that Ms Lappin recently finished Augmentin and I am assuming that she has a yeast infection caused by the antibiotic use. This information was not provided in the original message though.  I called Ms Sagona and left a voicemail for her to return my call with her symptoms and if she has tried anything OTC.

## 2016-05-24 NOTE — Telephone Encounter (Signed)
Rx sent.   Laroy Apple, MD [facility] 05/24/2016, 1:11 PM

## 2016-05-25 NOTE — Telephone Encounter (Signed)
Patient aware rx has been sent.

## 2016-06-20 ENCOUNTER — Other Ambulatory Visit: Payer: Self-pay | Admitting: Family Medicine

## 2016-07-21 ENCOUNTER — Other Ambulatory Visit: Payer: Self-pay | Admitting: Nurse Practitioner

## 2016-08-19 ENCOUNTER — Other Ambulatory Visit: Payer: Self-pay | Admitting: Family Medicine

## 2016-09-18 ENCOUNTER — Other Ambulatory Visit: Payer: Self-pay | Admitting: Nurse Practitioner

## 2016-09-18 DIAGNOSIS — M5489 Other dorsalgia: Secondary | ICD-10-CM

## 2016-10-19 ENCOUNTER — Other Ambulatory Visit: Payer: Self-pay | Admitting: Nurse Practitioner

## 2016-10-19 ENCOUNTER — Ambulatory Visit: Payer: BLUE CROSS/BLUE SHIELD | Admitting: Pediatrics

## 2016-11-16 ENCOUNTER — Other Ambulatory Visit: Payer: Self-pay | Admitting: Nurse Practitioner

## 2016-11-16 DIAGNOSIS — Z1231 Encounter for screening mammogram for malignant neoplasm of breast: Secondary | ICD-10-CM

## 2016-11-23 ENCOUNTER — Other Ambulatory Visit: Payer: Self-pay | Admitting: Family Medicine

## 2016-11-24 ENCOUNTER — Other Ambulatory Visit: Payer: Self-pay | Admitting: Family Medicine

## 2016-11-24 DIAGNOSIS — M5489 Other dorsalgia: Secondary | ICD-10-CM

## 2016-11-25 ENCOUNTER — Other Ambulatory Visit: Payer: Self-pay | Admitting: Family Medicine

## 2016-12-06 ENCOUNTER — Ambulatory Visit: Payer: Self-pay

## 2017-01-07 ENCOUNTER — Other Ambulatory Visit: Payer: Self-pay | Admitting: Family Medicine

## 2017-02-09 ENCOUNTER — Other Ambulatory Visit: Payer: Self-pay | Admitting: *Deleted

## 2017-02-09 MED ORDER — AMLODIPINE BESYLATE 5 MG PO TABS: ORAL_TABLET | ORAL | 0 refills | Status: DC

## 2017-02-13 ENCOUNTER — Encounter: Payer: BLUE CROSS/BLUE SHIELD | Admitting: Nurse Practitioner

## 2017-02-14 ENCOUNTER — Encounter: Payer: Self-pay | Admitting: Family Medicine

## 2017-02-22 ENCOUNTER — Encounter: Payer: Self-pay | Admitting: Family

## 2017-02-22 ENCOUNTER — Ambulatory Visit (INDEPENDENT_AMBULATORY_CARE_PROVIDER_SITE_OTHER): Payer: BLUE CROSS/BLUE SHIELD | Admitting: Family

## 2017-02-22 VITALS — BP 123/86 | HR 79 | Temp 98.2°F | Ht 65.75 in | Wt 209.0 lb

## 2017-02-22 DIAGNOSIS — Z0000 Encounter for general adult medical examination without abnormal findings: Secondary | ICD-10-CM | POA: Diagnosis not present

## 2017-02-22 DIAGNOSIS — Z01419 Encounter for gynecological examination (general) (routine) without abnormal findings: Secondary | ICD-10-CM

## 2017-02-22 DIAGNOSIS — E559 Vitamin D deficiency, unspecified: Secondary | ICD-10-CM

## 2017-02-22 DIAGNOSIS — Z114 Encounter for screening for human immunodeficiency virus [HIV]: Secondary | ICD-10-CM

## 2017-02-22 DIAGNOSIS — K219 Gastro-esophageal reflux disease without esophagitis: Secondary | ICD-10-CM

## 2017-02-22 DIAGNOSIS — I10 Essential (primary) hypertension: Secondary | ICD-10-CM

## 2017-02-22 LAB — MICROSCOPIC EXAMINATION: RENAL EPITHEL UA: NONE SEEN /HPF

## 2017-02-22 LAB — URINALYSIS, COMPLETE
Bilirubin, UA: NEGATIVE
Glucose, UA: NEGATIVE
Ketones, UA: NEGATIVE
NITRITE UA: NEGATIVE
PH UA: 6.5 (ref 5.0–7.5)
PROTEIN UA: NEGATIVE
Specific Gravity, UA: 1.015 (ref 1.005–1.030)
UUROB: 0.2 mg/dL (ref 0.2–1.0)

## 2017-02-22 MED ORDER — CYCLOBENZAPRINE HCL 5 MG PO TABS: 5.0000 mg | ORAL_TABLET | Freq: Three times a day (TID) | ORAL | 1 refills | Status: DC | PRN

## 2017-02-22 NOTE — Progress Notes (Signed)
Subjective:    Patient ID: Sabrina [name], female    DOB: 1971/01/13, 46 y.o.   MRN: [id number]  PT presents to the office today for CPE with pap. Gynecologic Exam  The patient's pertinent negatives include no genital itching, genital odor or vaginal discharge. The patient is experiencing no pain.  Hypertension  The current episode started more than 1 year ago. The problem has been resolved since onset. The problem is controlled. Associated symptoms include malaise/fatigue ("at times"). Pertinent negatives include no palpitations, peripheral edema or shortness of breath. Risk factors for coronary artery disease include obesity, sedentary lifestyle and dyslipidemia. The current treatment provides moderate improvement. There is no history of kidney disease, CAD/MI, CVA or heart failure.  Gastroesophageal Reflux  She complains of heartburn. She reports no belching or no dysphagia. This is a chronic problem. The current episode started more than 1 year ago. The problem occurs occasionally. The treatment provided moderate relief.      Review of Systems  Constitutional: Positive for malaise/fatigue ("at times").  Respiratory: Negative for shortness of breath.   Cardiovascular: Negative for palpitations.  Gastrointestinal: Positive for heartburn. Negative for dysphagia.  Genitourinary: Negative for vaginal discharge.  All other systems reviewed and are negative.      Objective:   Physical Exam  Constitutional: She is oriented to person, place, and time. She appears well-developed and well-nourished. No distress.  HENT:  Head: Normocephalic and atraumatic.  Right Ear: External ear normal.  Mouth/Throat: Oropharynx is clear and moist.  Eyes: Pupils are equal, round, and reactive to light.  Neck: Normal range of motion. Neck supple. No thyromegaly present.  Cardiovascular: Normal rate, regular rhythm, normal heart sounds and intact distal pulses.   No murmur heard. Pulmonary/Chest:  Effort normal and breath sounds normal. No respiratory distress. She has no wheezes. Right breast exhibits no inverted nipple, no mass, no nipple discharge, no skin change and no tenderness. Left breast exhibits no inverted nipple, no mass, no nipple discharge, no skin change and no tenderness. Breasts are symmetrical.  Abdominal: Soft. Bowel sounds are normal. She exhibits no distension. There is no tenderness.  Genitourinary: Vagina normal.  Genitourinary Comments: Bimanual exam- no adnexal masses or tenderness, ovaries nonpalpable   Cervix parous and pink- No discharge   Musculoskeletal: Normal range of motion. She exhibits no edema or tenderness.  Neurological: She is alert and oriented to person, place, and time. She has normal reflexes. No cranial nerve deficit.  Skin: Skin is warm and dry.  Psychiatric: She has a normal mood and affect. Her behavior is normal. Judgment and thought content normal.  Vitals reviewed.   BP 123/86   Pulse 79   Temp 98.2 F (36.8 C) (Oral)   Ht 5' 5.75" (1.67 m)   Wt 209 lb (94.8 kg)   LMP 01/23/2017   BMI 33.99 kg/m        Assessment & Plan:  1. Gynecologic exam normal - Urinalysis, Complete - CMP14+EGFR - Pap IG w/ reflex to HPV when ASC-U  2. Essential hypertension, benign - CMP14+EGFR  3. Vitamin D deficiency - CMP14+EGFR - VITAMIN D 25 Hydroxy (Vit-D Deficiency, Fractures)  4. Gastroesophageal reflux disease, esophagitis presence not specified - CMP14+EGFR  5. Annual physical exam - CMP14+EGFR - Lipid panel - Thyroid Panel With TSH - VITAMIN D 25 Hydroxy (Vit-D Deficiency, Fractures) - Pap IG w/ reflex to HPV when ASC-U  6. Encounter for screening for HIV - HIV antibody   Continue all meds Labs pending  Health Maintenance reviewed Diet and exercise encouraged [facility] [place], [place]

## 2017-02-22 NOTE — Addendum Note (Signed)
Addended by: Evelina Dun A on: 02/22/2017 11:08 AM   Modules accepted: Orders

## 2017-02-22 NOTE — Patient Instructions (Signed)
Health Maintenance, Female Adopting a healthy lifestyle and getting preventive care can go a long way to promote health and wellness. Talk with your health care provider about what schedule of regular examinations is right for you. This is a good chance for you to check in with your provider about disease prevention and staying healthy. In between checkups, there are plenty of things you can do on your own. Experts have done a lot of research about which lifestyle changes and preventive measures are most likely to keep you healthy. Ask your health care provider for more information. Weight and diet Eat a healthy diet  Be sure to include plenty of vegetables, fruits, low-fat dairy products, and lean protein.  Do not eat a lot of foods high in solid fats, added sugars, or salt.  Get regular exercise. This is one of the most important things you can do for your health.  Most adults should exercise for at least 150 minutes each week. The exercise should increase your heart rate and make you sweat (moderate-intensity exercise).  Most adults should also do strengthening exercises at least twice a week. This is in addition to the moderate-intensity exercise. Maintain a healthy weight  Body mass index (BMI) is a measurement that can be used to identify possible weight problems. It estimates body fat based on height and weight. Your health care provider can help determine your BMI and help you achieve or maintain a healthy weight.  For females 44 years of age and older:  A BMI below 18.5 is considered underweight.  A BMI of 18.5 to 24.9 is normal.  A BMI of 25 to 29.9 is considered overweight.  A BMI of 30 and above is considered obese. Watch levels of cholesterol and blood lipids  You should start having your blood tested for lipids and cholesterol at 44 years of age, then have this test every 5 years.  You may need to have your cholesterol levels checked more often if:  Your lipid or  cholesterol levels are high.  You are older than 44 years of age.  You are at high risk for heart disease. Cancer screening Lung Cancer  Lung cancer screening is recommended for adults 44-44 years old who are at high risk for lung cancer because of a history of smoking.  A yearly low-dose CT scan of the lungs is recommended for people who:  Currently smoke.  Have quit within the past 15 years.  Have at least a 30-pack-year history of smoking. A pack year is smoking an average of one pack of cigarettes a day for 1 year.  Yearly screening should continue until it has been 15 years since you quit.  Yearly screening should stop if you develop a health problem that would prevent you from having lung cancer treatment. Breast Cancer  Practice breast self-awareness. This means understanding how your breasts normally appear and feel.  It also means doing regular breast self-exams. Let your health care provider know about any changes, no matter how small.  If you are in your 20s or 30s, you should have a clinical breast exam (CBE) by a health care provider every 1-3 years as part of a regular health exam.  If you are 44 or older, have a CBE every year. Also consider having a breast X-ray (mammogram) every year.  If you have a family history of breast cancer, talk to your health care provider about genetic screening.  If you are at high risk for breast cancer, talk  to your health care provider about having an MRI and a mammogram every year.  Breast cancer gene (BRCA) assessment is recommended for women who have family members with BRCA-related cancers. BRCA-related cancers include:  Breast.  Ovarian.  Tubal.  Peritoneal cancers.  Results of the assessment will determine the need for genetic counseling and BRCA1 and BRCA2 testing. Cervical Cancer  Your health care provider may recommend that you be screened regularly for cancer of the pelvic organs (ovaries, uterus, and vagina).  This screening involves a pelvic examination, including checking for microscopic changes to the surface of your cervix (Pap test). You may be encouraged to have this screening done every 3 years, beginning at age 24.  For women ages 66-65, health care providers may recommend pelvic exams and Pap testing every 3 years, or they may recommend the Pap and pelvic exam, combined with testing for human papilloma virus (HPV), every 5 years. Some types of HPV increase your risk of cervical cancer. Testing for HPV may also be done on women of any age with unclear Pap test results.  Other health care providers may not recommend any screening for nonpregnant women who are considered low risk for pelvic cancer and who do not have symptoms. Ask your health care provider if a screening pelvic exam is right for you.  If you have had past treatment for cervical cancer or a condition that could lead to cancer, you need Pap tests and screening for cancer for at least 20 years after your treatment. If Pap tests have been discontinued, your risk factors (such as having a new sexual partner) need to be reassessed to determine if screening should resume. Some women have medical problems that increase the chance of getting cervical cancer. In these cases, your health care provider may recommend more frequent screening and Pap tests. Colorectal Cancer  This type of cancer can be detected and often prevented.  Routine colorectal cancer screening usually begins at 44 years of age and continues through 44 years of age.  Your health care provider may recommend screening at an earlier age if you have risk factors for colon cancer.  Your health care provider may also recommend using home test kits to check for hidden blood in the stool.  A small camera at the end of a tube can be used to examine your colon directly (sigmoidoscopy or colonoscopy). This is done to check for the earliest forms of colorectal cancer.  Routine  screening usually begins at age 44.  Direct examination of the colon should be repeated every 5-10 years through 44 years of age. However, you may need to be screened more often if early forms of precancerous polyps or small growths are found. Skin Cancer  Check your skin from head to toe regularly.  Tell your health care provider about any new moles or changes in moles, especially if there is a change in a mole's shape or color.  Also tell your health care provider if you have a mole that is larger than the size of a pencil eraser.  Always use sunscreen. Apply sunscreen liberally and repeatedly throughout the day.  Protect yourself by wearing long sleeves, pants, a wide-brimmed hat, and sunglasses whenever you are outside. Heart disease, diabetes, and high blood pressure  High blood pressure causes heart disease and increases the risk of stroke. High blood pressure is more likely to develop in:  People who have blood pressure in the high end of the normal range (130-139/85-89 mm Hg).  People who are overweight or obese.  People who are African American.  If you are 59-24 years of age, have your blood pressure checked every 3-5 years. If you are 34 years of age or older, have your blood pressure checked every year. You should have your blood pressure measured twice-once when you are at a hospital or clinic, and once when you are not at a hospital or clinic. Record the average of the two measurements. To check your blood pressure when you are not at a hospital or clinic, you can use:  An automated blood pressure machine at a pharmacy.  A home blood pressure monitor.  If you are between 29 years and 60 years old, ask your health care provider if you should take aspirin to prevent strokes.  Have regular diabetes screenings. This involves taking a blood sample to check your fasting blood sugar level.  If you are at a normal weight and have a low risk for diabetes, have this test once  every three years after 44 years of age.  If you are overweight and have a high risk for diabetes, consider being tested at a younger age or more often. Preventing infection Hepatitis B  If you have a higher risk for hepatitis B, you should be screened for this virus. You are considered at high risk for hepatitis B if:  You were born in a country where hepatitis B is common. Ask your health care provider which countries are considered high risk.  Your parents were born in a high-risk country, and you have not been immunized against hepatitis B (hepatitis B vaccine).  You have HIV or AIDS.  You use needles to inject street drugs.  You live with someone who has hepatitis B.  You have had sex with someone who has hepatitis B.  You get hemodialysis treatment.  You take certain medicines for conditions, including cancer, organ transplantation, and autoimmune conditions. Hepatitis C  Blood testing is recommended for:  Everyone born from [birth year] through 1963.  Anyone with known risk factors for hepatitis C. Sexually transmitted infections (STIs)  You should be screened for sexually transmitted infections (STIs) including gonorrhea and chlamydia if:  You are sexually active and are younger than 44 years of age.  You are older than 44 years of age and your health care provider tells you that you are at risk for this type of infection.  Your sexual activity has changed since you were last screened and you are at an increased risk for chlamydia or gonorrhea. Ask your health care provider if you are at risk.  If you do not have HIV, but are at risk, it may be recommended that you take a prescription medicine daily to prevent HIV infection. This is called pre-exposure prophylaxis (PrEP). You are considered at risk if:  You are sexually active and do not regularly use condoms or know the HIV status of your partner(s).  You take drugs by injection.  You are sexually active with a partner  who has HIV. Talk with your health care provider about whether you are at high risk of being infected with HIV. If you choose to begin PrEP, you should first be tested for HIV. You should then be tested every 3 months for as long as you are taking PrEP. Pregnancy  If you are premenopausal and you may become pregnant, ask your health care provider about preconception counseling.  If you may become pregnant, take 400 to 800 micrograms (mcg) of folic acid  every day.  If you want to prevent pregnancy, talk to your health care provider about birth control (contraception). Osteoporosis and menopause  Osteoporosis is a disease in which the bones lose minerals and strength with aging. This can result in serious bone fractures. Your risk for osteoporosis can be identified using a bone density scan.  If you are 4 years of age or older, or if you are at risk for osteoporosis and fractures, ask your health care provider if you should be screened.  Ask your health care provider whether you should take a calcium or vitamin D supplement to lower your risk for osteoporosis.  Menopause may have certain physical symptoms and risks.  Hormone replacement therapy may reduce some of these symptoms and risks. Talk to your health care provider about whether hormone replacement therapy is right for you. Follow these instructions at home:  Schedule regular health, dental, and eye exams.  Stay current with your immunizations.  Do not use any tobacco products including cigarettes, chewing tobacco, or electronic cigarettes.  If you are pregnant, do not drink alcohol.  If you are breastfeeding, limit how much and how often you drink alcohol.  Limit alcohol intake to no more than 1 drink per day for nonpregnant women. One drink equals 12 ounces of beer, 5 ounces of wine, or 1 ounces of hard liquor.  Do not use street drugs.  Do not share needles.  Ask your health care provider for help if you need support  or information about quitting drugs.  Tell your health care provider if you often feel depressed.  Tell your health care provider if you have ever been abused or do not feel safe at home. This information is not intended to replace advice given to you by your health care provider. Make sure you discuss any questions you have with your health care provider. Document Released: 04/04/2011 Document Revised: 02/25/2016 Document Reviewed: 06/23/2015 Elsevier Interactive Patient Education  2017 [employer].

## 2017-02-23 LAB — CMP14+EGFR
A/G RATIO: 1.6 (ref 1.2–2.2)
ALBUMIN: 4.1 g/dL (ref 3.5–5.5)
ALT: 31 IU/L (ref 0–32)
AST: 20 IU/L (ref 0–40)
Alkaline Phosphatase: 60 IU/L (ref 39–117)
BILIRUBIN TOTAL: 0.3 mg/dL (ref 0.0–1.2)
BUN / CREAT RATIO: 9 (ref 9–23)
BUN: 9 mg/dL (ref 6–24)
CHLORIDE: 101 mmol/L (ref 96–106)
CO2: 23 mmol/L (ref 18–29)
Calcium: 9.7 mg/dL (ref 8.7–10.2)
Creatinine, Ser: 0.98 mg/dL (ref 0.57–1.00)
GFR, EST AFRICAN AMERICAN: 82 mL/min/{1.73_m2} (ref >59)
GFR, EST NON AFRICAN AMERICAN: 71 mL/min/{1.73_m2} (ref >59)
Globulin, Total: 2.5 g/dL (ref 1.5–4.5)
Glucose: 77 mg/dL (ref 65–99)
POTASSIUM: 4.3 mmol/L (ref 3.5–5.2)
Sodium: 138 mmol/L (ref 134–144)
TOTAL PROTEIN: 6.6 g/dL (ref 6.0–8.5)

## 2017-02-23 LAB — THYROID PANEL WITH TSH
FREE THYROXINE INDEX: 2.3 (ref 1.2–4.9)
T3 Uptake Ratio: 25 % (ref 24–39)
T4, Total: 9.3 ug/dL (ref 4.5–12.0)
TSH: 1.9 u[IU]/mL (ref 0.450–4.500)

## 2017-02-23 LAB — LIPID PANEL
CHOL/HDL RATIO: 3.7 ratio (ref 0.0–4.4)
Cholesterol, Total: 180 mg/dL (ref 100–199)
HDL: 49 mg/dL (ref >39)
LDL Calculated: 116 mg/dL — ABNORMAL HIGH (ref 0–99)
Triglycerides: 75 mg/dL (ref 0–149)
VLDL CHOLESTEROL CAL: 15 mg/dL (ref 5–40)

## 2017-02-23 LAB — HIV ANTIBODY (ROUTINE TESTING W REFLEX): HIV Screen 4th Generation wRfx: NONREACTIVE

## 2017-02-23 LAB — VITAMIN D 25 HYDROXY (VIT D DEFICIENCY, FRACTURES): VIT D 25 HYDROXY: 37.4 ng/mL (ref 30.0–100.0)

## 2017-02-24 LAB — PAP IG W/ RFLX HPV ASCU: PAP Smear Comment: 0

## 2017-03-01 ENCOUNTER — Encounter: Payer: Self-pay | Admitting: *Deleted

## 2017-03-01 NOTE — Progress Notes (Signed)
Pt called wanting lab results. Lab results given by Cline Crock. Labs were released to my chart.

## 2017-03-09 ENCOUNTER — Other Ambulatory Visit: Payer: Self-pay | Admitting: Family Medicine

## 2017-03-12 ENCOUNTER — Other Ambulatory Visit: Payer: Self-pay | Admitting: Family Medicine

## 2017-03-30 ENCOUNTER — Telehealth: Payer: Self-pay | Admitting: Family

## 2017-03-30 NOTE — Telephone Encounter (Signed)
What symptoms do you have? Uti. Was told she had one at last visit. Lower abdominal pain, burning with urination and nausea. Says christy told her to call  How long have you been sick? Two weeks  Have you been seen for this problem? yes  If your provider decides to give you a prescription, which pharmacy would you like for it to be sent to? [facility] [place].   Patient informed that this information will be sent to the clinical staff for review and that they should receive a follow up call.

## 2017-03-31 MED ORDER — CIPROFLOXACIN HCL 500 MG PO TABS: 500.0000 mg | ORAL_TABLET | Freq: Two times a day (BID) | ORAL | 0 refills | Status: DC

## 2017-03-31 NOTE — Telephone Encounter (Signed)
Pt notified RX sent into [facility]

## 2017-03-31 NOTE — Telephone Encounter (Signed)
Cipro Prescription sent to pharmacy   

## 2017-05-23 ENCOUNTER — Ambulatory Visit (INDEPENDENT_AMBULATORY_CARE_PROVIDER_SITE_OTHER): Payer: 59 | Admitting: Family

## 2017-05-23 ENCOUNTER — Encounter: Payer: Self-pay | Admitting: Family

## 2017-05-23 VITALS — BP 150/97 | HR 77 | Temp 97.8°F | Ht 65.75 in | Wt 210.2 lb

## 2017-05-23 DIAGNOSIS — J0100 Acute maxillary sinusitis, unspecified: Secondary | ICD-10-CM

## 2017-05-23 MED ORDER — AMOXICILLIN-POT CLAVULANATE 875-125 MG PO TABS: 1.0000 | ORAL_TABLET | Freq: Two times a day (BID) | ORAL | 0 refills | Status: DC

## 2017-05-23 MED ORDER — FLUCONAZOLE 150 MG PO TABS: 150.0000 mg | ORAL_TABLET | ORAL | 0 refills | Status: DC | PRN

## 2017-05-23 NOTE — Patient Instructions (Signed)

## 2017-05-23 NOTE — Progress Notes (Signed)
   Subjective:    Patient ID: Sabrina [name], female    DOB: 1971/09/13, 46 y.o.   MRN: [id number]  Cough  This is a new problem. The current episode started 1 to 4 weeks ago. The problem has been gradually worsening. The problem occurs every few minutes. The cough is productive of sputum and productive of purulent sputum. Associated symptoms include headaches, myalgias, nasal congestion, postnasal drip, a sore throat and wheezing. Pertinent negatives include no chills, ear congestion, ear pain or fever. Associated symptoms comments: Sinus pressure . Nothing aggravates the symptoms. She has tried rest and OTC cough suppressant for the symptoms. The treatment provided mild relief.      Review of Systems  Constitutional: Negative for chills and fever.  HENT: Positive for postnasal drip and sore throat. Negative for ear pain.   Respiratory: Positive for cough and wheezing.   Musculoskeletal: Positive for myalgias.  Neurological: Positive for headaches.  All other systems reviewed and are negative.      Objective:   Physical Exam  Constitutional: She is oriented to person, place, and time. She appears well-developed and well-nourished. No distress.  HENT:  Head: Normocephalic and atraumatic.  Right Ear: External ear normal.  Left Ear: A middle ear effusion is present.  Nose: Mucosal edema and rhinorrhea present. Right sinus exhibits maxillary sinus tenderness. Left sinus exhibits maxillary sinus tenderness.  Mouth/Throat: Posterior oropharyngeal erythema present.  Eyes: Pupils are equal, round, and reactive to light.  Neck: Normal range of motion. Neck supple. No thyromegaly present.  Cardiovascular: Normal rate, regular rhythm, normal heart sounds and intact distal pulses.   No murmur heard. Pulmonary/Chest: Effort normal and breath sounds normal. No respiratory distress. She has no wheezes.  Abdominal: Soft. Bowel sounds are normal. She exhibits no distension. There is no  tenderness.  Musculoskeletal: Normal range of motion. She exhibits no edema or tenderness.  Neurological: She is alert and oriented to person, place, and time. She has normal reflexes. No cranial nerve deficit.  Skin: Skin is warm and dry.  Psychiatric: She has a normal mood and affect. Her behavior is normal. Judgment and thought content normal.  Vitals reviewed.     BP (!) 150/97   Pulse 77   Temp 97.8 F (36.6 C) (Oral)   Ht 5' 5.75" (1.67 m)   Wt 210 lb 3.2 oz (95.3 kg)   BMI 34.19 kg/m      Assessment & Plan:  1. Acute maxillary sinusitis, recurrence not specified - Take meds as prescribed - Use a cool mist humidifier  -Use saline nose sprays frequently -Saline irrigations of the nose can be very helpful if done frequently.  * 4X daily for 1 week*  * Use of a nettie pot can be helpful with this. Follow directions with this* -Force fluids -For any cough or congestion  Use plain Mucinex- regular strength or max strength is fine   * Children- consult with Pharmacist for dosing -For fever or aces or pains- take tylenol or ibuprofen appropriate for age and weight.  * for fevers greater than 101 orally you may alternate ibuprofen and tylenol every  3 hours. -Throat lozenges if help -New toothbrush in 3 days - amoxicillin-clavulanate (AUGMENTIN) 875-125 MG tablet; Take 1 tablet by mouth 2 (two) times daily.  Dispense: 14 tablet; Refill: 0   Evelina Dun, FNP

## 2017-05-24 ENCOUNTER — Telehealth: Payer: Self-pay | Admitting: Family

## 2017-05-25 ENCOUNTER — Telehealth: Payer: Self-pay | Admitting: Family Medicine

## 2017-05-25 MED ORDER — BENZONATATE 200 MG PO CAPS: 200.0000 mg | ORAL_CAPSULE | Freq: Three times a day (TID) | ORAL | 1 refills | Status: DC | PRN

## 2017-05-25 NOTE — Telephone Encounter (Signed)
Left detailed message regarding RX 

## 2017-05-25 NOTE — Telephone Encounter (Signed)
Tessalon Prescription sent to pharmacy   

## 2017-05-25 NOTE — Telephone Encounter (Signed)
Done

## 2017-05-29 ENCOUNTER — Telehealth: Payer: Self-pay | Admitting: Family Medicine

## 2017-05-29 NOTE — Telephone Encounter (Signed)
The patient is still not better or if she got worse on the antibiotic then she needs to be seen again. Augmentin is a pretty strong antibiotic

## 2017-05-29 NOTE — Telephone Encounter (Signed)
Patient was dx with sinus infection 8/21 with [facility], and she was given augmentin for 7 days does not know what mg. Only helped a little. Pt c/o right side still stopped up and all her teeth her hurt, neck pain with moving still has a bad cough. Patient last day of abx is tomorrow. Patient uses [facility] in [place]

## 2017-05-29 NOTE — Telephone Encounter (Signed)
Detailed message left for patient.

## 2017-05-29 NOTE — Telephone Encounter (Signed)
DR D,  Can you address message for [facility]  (coverage)

## 2017-05-30 ENCOUNTER — Ambulatory Visit: Payer: 59

## 2017-05-31 ENCOUNTER — Encounter: Payer: Self-pay | Admitting: Family Medicine

## 2017-08-12 ENCOUNTER — Other Ambulatory Visit: Payer: Self-pay | Admitting: Family Medicine

## 2017-10-19 ENCOUNTER — Other Ambulatory Visit: Payer: Self-pay | Admitting: Family

## 2017-10-20 NOTE — Telephone Encounter (Signed)
Last seen 05/23/17  Dr Wendi Snipes

## 2017-12-25 ENCOUNTER — Other Ambulatory Visit: Payer: Self-pay | Admitting: Family Medicine

## 2018-01-29 ENCOUNTER — Ambulatory Visit (INDEPENDENT_AMBULATORY_CARE_PROVIDER_SITE_OTHER): Payer: 59 | Admitting: Physician Assistant

## 2018-01-29 ENCOUNTER — Encounter: Payer: Self-pay | Admitting: Physician Assistant

## 2018-01-29 VITALS — BP 138/88 | HR 82 | Temp 98.1°F | Ht 65.75 in | Wt 224.2 lb

## 2018-01-29 DIAGNOSIS — R1013 Epigastric pain: Secondary | ICD-10-CM

## 2018-01-29 MED ORDER — OMEPRAZOLE 20 MG PO CPDR: 20.0000 mg | DELAYED_RELEASE_CAPSULE | Freq: Two times a day (BID) | ORAL | 3 refills | Status: DC

## 2018-01-30 LAB — CMP14+EGFR
ALK PHOS: 59 IU/L (ref 39–117)
ALT: 28 IU/L (ref 0–32)
AST: 19 IU/L (ref 0–40)
Albumin/Globulin Ratio: 1.7 (ref 1.2–2.2)
Albumin: 4 g/dL (ref 3.5–5.5)
BILIRUBIN TOTAL: 0.2 mg/dL (ref 0.0–1.2)
BUN / CREAT RATIO: 8 — AB (ref 9–23)
BUN: 8 mg/dL (ref 6–24)
CHLORIDE: 104 mmol/L (ref 96–106)
CO2: 21 mmol/L (ref 20–29)
CREATININE: 1 mg/dL (ref 0.57–1.00)
Calcium: 9.6 mg/dL (ref 8.7–10.2)
GFR calc Af Amer: 79 mL/min/{1.73_m2} (ref >59)
GFR calc non Af Amer: 69 mL/min/{1.73_m2} (ref >59)
Globulin, Total: 2.4 g/dL (ref 1.5–4.5)
Glucose: 93 mg/dL (ref 65–99)
Potassium: 3.7 mmol/L (ref 3.5–5.2)
Sodium: 139 mmol/L (ref 134–144)
Total Protein: 6.4 g/dL (ref 6.0–8.5)

## 2018-01-30 LAB — CBC WITH DIFFERENTIAL/PLATELET
BASOS ABS: 0 10*3/uL (ref 0.0–0.2)
Basos: 0 %
EOS (ABSOLUTE): 0.1 10*3/uL (ref 0.0–0.4)
Eos: 1 %
Hematocrit: 35.2 % (ref 34.0–46.6)
Hemoglobin: 12.1 g/dL (ref 11.1–15.9)
Immature Grans (Abs): 0 10*3/uL (ref 0.0–0.1)
Immature Granulocytes: 0 %
LYMPHS ABS: 1.9 10*3/uL (ref 0.7–3.1)
LYMPHS: 38 %
MCH: 30.6 pg (ref 26.6–33.0)
MCHC: 34.4 g/dL (ref 31.5–35.7)
MCV: 89 fL (ref 79–97)
Monocytes Absolute: 0.5 10*3/uL (ref 0.1–0.9)
Monocytes: 10 %
NEUTROS ABS: 2.6 10*3/uL (ref 1.4–7.0)
Neutrophils: 51 %
PLATELETS: 304 10*3/uL (ref 150–379)
RBC: 3.96 x10E6/uL (ref 3.77–5.28)
RDW: 12.4 % (ref 12.3–15.4)
WBC: 5.1 10*3/uL (ref 3.4–10.8)

## 2018-01-30 LAB — H PYLORI, IGM, IGG, IGA AB: H PYLORI IGG: 2.96 {index_val} — AB (ref 0.00–0.79)

## 2018-01-30 NOTE — Progress Notes (Signed)
Patient with increasing pain, nausea, GI hyperactivity. Over the past 2 nights she had had episodes at 1 or 2 AM. It was quite painful and nothing relieved or worsened. She cannot think of any food that was the trigger.  She does have fibroids.  She had issues of  GERD many years ago. Tried over the counter without any relief.    BP 138/88   Pulse 82   Temp 98.1 F (36.7 C) (Oral)   Ht 5' 5.75" (1.67 m)   Wt 224 lb 3.2 oz (101.7 kg)   BMI 36.46 kg/m    Subjective:    Patient ID: Sabrina [name], female    DOB: 04-19-1971, 47 y.o.   MRN: [id number]  HPI: Sabrina [name] is a 47 y.o. female presenting on 01/29/2018 for Abdominal Pain (off & on Saturday & Sunday night the worse, Zantac, Tums, Mylanta not necessarily after eating, in middle of night,); Leg Swelling (off & on last couple weeks, last couple days worse, right leg worse, has been in bed today); and Stool Color Change (yellow stool, still has gallbladder)    Past Medical History:  Diagnosis Date  . Hypertension    Relevant past medical, surgical, family and social history reviewed and updated as indicated. Interim medical history since our last visit reviewed. Allergies and medications reviewed and updated. DATA REVIEWED: CHART IN EPIC  Family History reviewed for pertinent findings.  Review of Systems  Constitutional: Negative.   HENT: Negative.   Eyes: Negative.   Respiratory: Negative.   Gastrointestinal: Positive for abdominal distention, abdominal pain and nausea. Negative for blood in stool, constipation, diarrhea and vomiting.  Genitourinary: Negative.  Negative for difficulty urinating and frequency.    Allergies as of 01/29/2018   No Known Allergies     Medication List        Accurate as of 01/29/18 11:59 PM. Always use your most recent med list.          amLODipine 5 MG tablet Commonly known as:  NORVASC TAKE 1 TABLET BY MOUTH ONCE DAILY   cyclobenzaprine 5 MG tablet Commonly known as:   FLEXERIL Take 1 tablet (5 mg total) by mouth 3 (three) times daily as needed for muscle spasms.   naproxen 500 MG tablet Commonly known as:  NAPROSYN TAKE 1 TABLET TWICE A DAY WITH A MEAL   omeprazole 20 MG capsule Commonly known as:  PRILOSEC Take 1 capsule (20 mg total) by mouth 2 (two) times daily before a meal.   ranitidine 150 MG tablet Commonly known as:  ZANTAC Take 1 tablet (150 mg total) by mouth 2 (two) times daily.   Vitamin D (Ergocalciferol) 50000 units Caps capsule Commonly known as:  DRISDOL TAKE 1 CAPSULE BY MOUTH ONCE A WEEK          Objective:    BP 138/88   Pulse 82   Temp 98.1 F (36.7 C) (Oral)   Ht 5' 5.75" (1.67 m)   Wt 224 lb 3.2 oz (101.7 kg)   BMI 36.46 kg/m   No Known Allergies  Wt Readings from Last 3 Encounters:  01/29/18 224 lb 3.2 oz (101.7 kg)  05/23/17 210 lb 3.2 oz (95.3 kg)  02/22/17 209 lb (94.8 kg)    Physical Exam  Constitutional: She is oriented to person, place, and time. She appears well-developed and well-nourished.  HENT:  Head: Normocephalic and atraumatic.  Right Ear: Tympanic membrane, external ear and ear canal normal.  Left Ear: Tympanic membrane, external ear  and ear canal normal.  Nose: Nose normal. No rhinorrhea.  Mouth/Throat: Oropharynx is clear and moist and mucous membranes are normal. No oropharyngeal exudate or posterior oropharyngeal erythema.  Eyes: Pupils are equal, round, and reactive to light. Conjunctivae and EOM are normal.  Neck: Normal range of motion. Neck supple.  Cardiovascular: Normal rate, regular rhythm, normal heart sounds and intact distal pulses.  Pulmonary/Chest: Effort normal and breath sounds normal.  Abdominal: Soft. Bowel sounds are normal.  Neurological: She is alert and oriented to person, place, and time. She has normal reflexes.  Skin: Skin is warm and dry. No rash noted.  Psychiatric: She has a normal mood and affect. Her behavior is normal. Judgment and thought content normal.      Results for orders placed or performed in visit on 01/29/18  CBC with Differential/Platelet  Result Value Ref Range   WBC 5.1 3.4 - 10.8 x10E3/uL   RBC 3.96 3.77 - 5.28 x10E6/uL   Hemoglobin 12.1 11.1 - 15.9 g/dL   Hematocrit 35.2 34.0 - 46.6 %   MCV 89 79 - 97 fL   MCH 30.6 26.6 - 33.0 pg   MCHC 34.4 31.5 - 35.7 g/dL   RDW 12.4 12.3 - 15.4 %   Platelets 304 150 - 379 x10E3/uL   Neutrophils 51 Not Estab. %   Lymphs 38 Not Estab. %   Monocytes 10 Not Estab. %   Eos 1 Not Estab. %   Basos 0 Not Estab. %   Neutrophils Absolute 2.6 1.4 - 7.0 x10E3/uL   Lymphocytes Absolute 1.9 0.7 - 3.1 x10E3/uL   Monocytes Absolute 0.5 0.1 - 0.9 x10E3/uL   EOS (ABSOLUTE) 0.1 0.0 - 0.4 x10E3/uL   Basophils Absolute 0.0 0.0 - 0.2 x10E3/uL   Immature Granulocytes 0 Not Estab. %   Immature Grans (Abs) 0.0 0.0 - 0.1 x10E3/uL  CMP14+EGFR  Result Value Ref Range   Glucose 93 65 - 99 mg/dL   BUN 8 6 - 24 mg/dL   Creatinine, Ser 1.00 0.57 - 1.00 mg/dL   GFR calc non Af Amer 69 >59 mL/min/1.73   GFR calc Af Amer 79 >59 mL/min/1.73   BUN/Creatinine Ratio 8 (L) 9 - 23   Sodium 139 134 - 144 mmol/L   Potassium 3.7 3.5 - 5.2 mmol/L   Chloride 104 96 - 106 mmol/L   CO2 21 20 - 29 mmol/L   Calcium 9.6 8.7 - 10.2 mg/dL   Total Protein 6.4 6.0 - 8.5 g/dL   Albumin 4.0 3.5 - 5.5 g/dL   Globulin, Total 2.4 1.5 - 4.5 g/dL   Albumin/Globulin Ratio 1.7 1.2 - 2.2   Bilirubin Total 0.2 0.0 - 1.2 mg/dL   Alkaline Phosphatase 59 39 - 117 IU/L   AST 19 0 - 40 IU/L   ALT 28 0 - 32 IU/L  H Pylori, IGM, IGG, IGA AB  Result Value Ref Range   H. pylori, IgG AbS 2.96 (H) 0.00 - 0.79 Index Value   H. pylori, IgA Abs <9.0 0.0 - 8.9 units   H pylori, IgM Abs <9.0 0.0 - 8.9 units      Assessment & Plan:   1. Epigastric pain - omeprazole (PRILOSEC) 20 MG capsule; Take 1 capsule (20 mg total) by mouth 2 (two) times daily before a meal.  Dispense: 60 capsule; Refill: 3 - CBC with Differential/Platelet -  CMP14+EGFR - H Pylori, IGM, IGG, IGA AB    Continue all other maintenance medications as listed above.  Follow  up plan: No follow-ups on file.  Educational handout given for [place] PA-C [place] [address]  [place], [place] [address] [phone]   01/30/2018, 8:30 PM

## 2018-01-31 ENCOUNTER — Other Ambulatory Visit: Payer: Self-pay | Admitting: *Deleted

## 2018-01-31 MED ORDER — FLUCONAZOLE 150 MG PO TABS: ORAL_TABLET | ORAL | 0 refills | Status: DC

## 2018-01-31 MED ORDER — CLARITHROMYCIN 500 MG PO TABS: 500.0000 mg | ORAL_TABLET | Freq: Two times a day (BID) | ORAL | 0 refills | Status: DC

## 2018-01-31 MED ORDER — AMOXICILLIN 500 MG PO TABS: 1000.0000 mg | ORAL_TABLET | Freq: Two times a day (BID) | ORAL | 0 refills | Status: DC

## 2018-01-31 NOTE — Addendum Note (Signed)
Addended by: Thana Ates on: 01/31/2018 09:07 AM   Modules accepted: Orders

## 2018-02-17 ENCOUNTER — Telehealth: Payer: Self-pay | Admitting: Family

## 2018-02-19 MED ORDER — FLUCONAZOLE 150 MG PO TABS: ORAL_TABLET | ORAL | 0 refills | Status: DC

## 2018-02-19 NOTE — Telephone Encounter (Signed)
Spoke with pt and she states her legs have gotten better now since she has kept them elevated over the weekend. She is now c/o yeast infection due to the antibiotics she has been taking and she had taken her last Diflucan last week. One diflucan sent in as requested.

## 2018-03-31 ENCOUNTER — Other Ambulatory Visit: Payer: Self-pay | Admitting: Family

## 2018-04-02 ENCOUNTER — Encounter: Payer: Self-pay | Admitting: *Deleted

## 2018-04-02 NOTE — Telephone Encounter (Signed)
Patient NTBS for follow up and lab work  

## 2018-05-01 ENCOUNTER — Ambulatory Visit (INDEPENDENT_AMBULATORY_CARE_PROVIDER_SITE_OTHER): Payer: 59 | Admitting: Family

## 2018-05-01 ENCOUNTER — Encounter: Payer: Self-pay | Admitting: Family

## 2018-05-01 VITALS — BP 135/91 | HR 78 | Temp 99.4°F | Ht 65.75 in | Wt 222.4 lb

## 2018-05-01 DIAGNOSIS — E669 Obesity, unspecified: Secondary | ICD-10-CM | POA: Insufficient documentation

## 2018-05-01 DIAGNOSIS — M25511 Pain in right shoulder: Secondary | ICD-10-CM

## 2018-05-01 DIAGNOSIS — Z0000 Encounter for general adult medical examination without abnormal findings: Secondary | ICD-10-CM | POA: Diagnosis not present

## 2018-05-01 DIAGNOSIS — I10 Essential (primary) hypertension: Secondary | ICD-10-CM

## 2018-05-01 DIAGNOSIS — K219 Gastro-esophageal reflux disease without esophagitis: Secondary | ICD-10-CM

## 2018-05-01 DIAGNOSIS — R609 Edema, unspecified: Secondary | ICD-10-CM

## 2018-05-01 DIAGNOSIS — E785 Hyperlipidemia, unspecified: Secondary | ICD-10-CM | POA: Insufficient documentation

## 2018-05-01 DIAGNOSIS — E559 Vitamin D deficiency, unspecified: Secondary | ICD-10-CM

## 2018-05-01 MED ORDER — AMLODIPINE BESYLATE 5 MG PO TABS
5.0000 mg | ORAL_TABLET | Freq: Every day | ORAL | 4 refills | Status: DC
Start: 2018-05-01 — End: 2019-05-30

## 2018-05-01 MED ORDER — PREDNISONE 10 MG (21) PO TBPK: ORAL_TABLET | ORAL | 0 refills | Status: DC

## 2018-05-01 MED ORDER — HYDROCHLOROTHIAZIDE 12.5 MG PO TABS: 12.5000 mg | ORAL_TABLET | Freq: Every day | ORAL | 3 refills | Status: DC

## 2018-05-01 MED ORDER — OMEPRAZOLE 20 MG PO CPDR: 20.0000 mg | DELAYED_RELEASE_CAPSULE | Freq: Two times a day (BID) | ORAL | 3 refills | Status: DC

## 2018-05-01 NOTE — Patient Instructions (Signed)

## 2018-05-01 NOTE — Progress Notes (Signed)
Subjective:    Patient ID: Sabrina [name], female    DOB: September 24, 1971, 47 y.o.   MRN: [id number]  Chief Complaint  Patient presents with  . Annual Exam    Hypertension  This is a chronic problem. The current episode started more than 1 year ago. The problem has been waxing and waning since onset. The problem is uncontrolled. Associated symptoms include malaise/fatigue and peripheral edema (trace). Pertinent negatives include no headaches, palpitations or shortness of breath. Risk factors for coronary artery disease include sedentary lifestyle. Past treatments include calcium channel blockers. The current treatment provides mild improvement. There is no history of kidney disease, CAD/MI, CVA or heart failure.  Gastroesophageal Reflux  She reports no belching, no coughing or no heartburn. This is a chronic problem. The current episode started more than 1 year ago. The problem occurs occasionally. Risk factors include obesity. She has tried a PPI for the symptoms. The treatment provided moderate relief.  Hyperlipidemia  This is a chronic problem. The current episode started more than 1 year ago. The problem is uncontrolled. Recent lipid tests were reviewed and are high. Exacerbating diseases include obesity. Pertinent negatives include no shortness of breath. She is currently on no antihyperlipidemic treatment. The current treatment provides no improvement of lipids. Risk factors for coronary artery disease include dyslipidemia, hypertension and a sedentary lifestyle.  Shoulder Pain   The pain is present in the right shoulder. This is a recurrent problem. The current episode started more than 1 month ago. The problem occurs intermittently. The problem has been waxing and waning. The quality of the pain is described as aching. The pain is at a severity of 7/10. The pain is moderate. She has tried NSAIDS for the symptoms. The treatment provided mild relief.  Peripheral Edema PT states over the last  6-7 months she has noticed swelling in bilateral legs.     Review of Systems  Constitutional: Positive for malaise/fatigue.  HENT: Negative.   Eyes: Negative.   Respiratory: Negative.  Negative for cough and shortness of breath.   Cardiovascular: Negative.  Negative for palpitations.  Gastrointestinal: Negative.  Negative for heartburn.  Endocrine: Negative.   Genitourinary: Negative.   Musculoskeletal: Negative.   Neurological: Negative.  Negative for headaches.  Hematological: Negative.   Psychiatric/Behavioral: Negative.   All other systems reviewed and are negative.      Objective:   Physical Exam  Constitutional: She is oriented to person, place, and time. She appears well-developed and well-nourished. No distress.  HENT:  Head: Normocephalic and atraumatic.  Right Ear: External ear normal.  Left Ear: External ear normal.  Mouth/Throat: Oropharynx is clear and moist.  Eyes: Pupils are equal, round, and reactive to light.  Neck: Normal range of motion. Neck supple. No thyromegaly present.  Cardiovascular: Normal rate, regular rhythm, normal heart sounds and intact distal pulses.  No murmur heard. Pulmonary/Chest: Effort normal and breath sounds normal. No respiratory distress. She has no wheezes.  Abdominal: Soft. Bowel sounds are normal. She exhibits no distension. There is no tenderness.  Musculoskeletal: Normal range of motion. She exhibits edema (trace BLE). She exhibits no tenderness.  Neurological: She is alert and oriented to person, place, and time. She has normal reflexes. No cranial nerve deficit.  Skin: Skin is warm and dry.  Psychiatric: She has a normal mood and affect. Her behavior is normal. Judgment and thought content normal.  Vitals reviewed.     BP (!) 135/91   Pulse 78   Temp 99.4 F (  37.4 C) (Oral)   Ht 5' 5.75" (1.67 m)   Wt 222 lb 6.4 oz (100.9 kg)   BMI 36.17 kg/m      Assessment & Plan:  Sabrina [name] comes in today with chief  complaint of Annual Exam   Diagnosis and orders addressed:  1. Annual physical exam - CBC with Differential/Platelet - CMP14+EGFR - Lipid panel - TSH - VITAMIN D 25 Hydroxy (Vit-D Deficiency, Fractures)  2. Essential hypertension, benign Will add HCTZ 12.5 mg  Low salt diet RTO in 2 weeks - hydrochlorothiazide (HYDRODIURIL) 12.5 MG tablet; Take 1 tablet (12.5 mg total) by mouth daily.  Dispense: 90 tablet; Refill: 3 - CBC with Differential/Platelet - CMP14+EGFR - amLODipine (NORVASC) 5 MG tablet; Take 1 tablet (5 mg total) by mouth daily.  Dispense: 90 tablet; Refill: 4  3. Gastroesophageal reflux disease, esophagitis presence not specified - CBC with Differential/Platelet - CMP14+EGFR - omeprazole (PRILOSEC) 20 MG capsule; Take 1 capsule (20 mg total) by mouth 2 (two) times daily before a meal.  Dispense: 60 capsule; Refill: 3  4. Vitamin D deficiency - CBC with Differential/Platelet - CMP14+EGFR - VITAMIN D 25 Hydroxy (Vit-D Deficiency, Fractures)  5. Hyperlipidemia, unspecified hyperlipidemia type - CBC with Differential/Platelet - CMP14+EGFR - Lipid panel  6. Morbid obesity ([place]) - CBC with Differential/Platelet - CMP14+EGFR  7. Peripheral edema Will add HCTZ 12.5 mg  Compression hose - hydrochlorothiazide (HYDRODIURIL) 12.5 MG tablet; Take 1 tablet (12.5 mg total) by mouth daily.  Dispense: 90 tablet; Refill: 3 - CBC with Differential/Platelet - CMP14+EGFR - Compression stockings  8. Acute pain of right shoulder Rest ICe Naprosyn BID with food - predniSONE (STERAPRED UNI-PAK 21 TAB) 10 MG (21) TBPK tablet; Use as directed  Dispense: 21 tablet; Refill: 0   Labs pending Health Maintenance reviewed Diet and exercise encouraged  Follow up plan: 2 weeks to recheck HTN and peripheral edema   Evelina Dun, FNP

## 2018-05-02 LAB — CMP14+EGFR
ALT: 25 IU/L (ref 0–32)
AST: 12 IU/L (ref 0–40)
Albumin/Globulin Ratio: 1.7 (ref 1.2–2.2)
Albumin: 4.2 g/dL (ref 3.5–5.5)
Alkaline Phosphatase: 58 IU/L (ref 39–117)
BILIRUBIN TOTAL: 0.3 mg/dL (ref 0.0–1.2)
BUN / CREAT RATIO: 10 (ref 9–23)
BUN: 8 mg/dL (ref 6–24)
CALCIUM: 9.9 mg/dL (ref 8.7–10.2)
CHLORIDE: 103 mmol/L (ref 96–106)
CO2: 19 mmol/L — ABNORMAL LOW (ref 20–29)
Creatinine, Ser: 0.82 mg/dL (ref 0.57–1.00)
GFR, EST AFRICAN AMERICAN: 100 mL/min/{1.73_m2} (ref >59)
GFR, EST NON AFRICAN AMERICAN: 87 mL/min/{1.73_m2} (ref >59)
Globulin, Total: 2.5 g/dL (ref 1.5–4.5)
Glucose: 79 mg/dL (ref 65–99)
POTASSIUM: 4.2 mmol/L (ref 3.5–5.2)
SODIUM: 141 mmol/L (ref 134–144)
TOTAL PROTEIN: 6.7 g/dL (ref 6.0–8.5)

## 2018-05-02 LAB — LIPID PANEL
CHOL/HDL RATIO: 3.8 ratio (ref 0.0–4.4)
Cholesterol, Total: 184 mg/dL (ref 100–199)
HDL: 49 mg/dL (ref >39)
LDL Calculated: 119 mg/dL — ABNORMAL HIGH (ref 0–99)
Triglycerides: 82 mg/dL (ref 0–149)
VLDL Cholesterol Cal: 16 mg/dL (ref 5–40)

## 2018-05-02 LAB — CBC WITH DIFFERENTIAL/PLATELET
BASOS: 1 %
Basophils Absolute: 0 10*3/uL (ref 0.0–0.2)
EOS (ABSOLUTE): 0 10*3/uL (ref 0.0–0.4)
EOS: 1 %
Hematocrit: 37.7 % (ref 34.0–46.6)
Hemoglobin: 12.6 g/dL (ref 11.1–15.9)
IMMATURE GRANS (ABS): 0 10*3/uL (ref 0.0–0.1)
Immature Granulocytes: 0 %
LYMPHS: 37 %
Lymphocytes Absolute: 1.6 10*3/uL (ref 0.7–3.1)
MCH: 30.3 pg (ref 26.6–33.0)
MCHC: 33.4 g/dL (ref 31.5–35.7)
MCV: 91 fL (ref 79–97)
MONOS ABS: 0.3 10*3/uL (ref 0.1–0.9)
Monocytes: 7 %
NEUTROS ABS: 2.4 10*3/uL (ref 1.4–7.0)
Neutrophils: 54 %
PLATELETS: 317 10*3/uL (ref 150–450)
RBC: 4.16 x10E6/uL (ref 3.77–5.28)
RDW: 11.7 % — ABNORMAL LOW (ref 12.3–15.4)
WBC: 4.4 10*3/uL (ref 3.4–10.8)

## 2018-05-02 LAB — VITAMIN D 25 HYDROXY (VIT D DEFICIENCY, FRACTURES): Vit D, 25-Hydroxy: 42.7 ng/mL (ref 30.0–100.0)

## 2018-05-02 LAB — TSH: TSH: 1.39 u[IU]/mL (ref 0.450–4.500)

## 2018-05-15 ENCOUNTER — Ambulatory Visit: Payer: 59 | Admitting: Family

## 2018-05-19 ENCOUNTER — Ambulatory Visit (INDEPENDENT_AMBULATORY_CARE_PROVIDER_SITE_OTHER): Payer: 59 | Admitting: Family Medicine

## 2018-05-19 ENCOUNTER — Encounter: Payer: Self-pay | Admitting: Family Medicine

## 2018-05-19 VITALS — BP 113/77 | HR 88 | Temp 98.6°F | Ht 65.75 in | Wt 221.2 lb

## 2018-05-19 DIAGNOSIS — R600 Localized edema: Secondary | ICD-10-CM | POA: Diagnosis not present

## 2018-05-19 DIAGNOSIS — I10 Essential (primary) hypertension: Secondary | ICD-10-CM

## 2018-05-19 NOTE — Progress Notes (Signed)
Subjective: CC: pedal edema PCP: [name], FNP OTL:[id number] Sabrina [name] is a 47 y.o. female presenting to clinic today for:  1. Pedal edema Patient was seen 2 weeks ago for annual physical.  She is noted to be hypertensive and was concerned about lower extremity edema.  She was placed on hydrochlorothiazide 12.5 mg daily and prescribed compression hose.  She is here for 2-week follow-up.  She notes that she has had a couple of spells of lightheadedness.  Notably, she had one instance at work where she felt not herself for quite some time after the dizzy spell.  It occurred after she rose from a seated position.  She felt the need to stabilize herself.  She does report good urine output and improvement in lower extremity swelling on the hydrochlorothiazide.  She notes occasional leg cramping.  She has been hydrating without difficulty.  She is not yet picked up the compression hose.   ROS: Per HPI  No Known Allergies Past Medical History:  Diagnosis Date  . Hypertension     Current Outpatient Medications:  .  amLODipine (NORVASC) 5 MG tablet, Take 1 tablet (5 mg total) by mouth daily., Disp: 90 tablet, Rfl: 4 .  hydrochlorothiazide (HYDRODIURIL) 12.5 MG tablet, Take 1 tablet (12.5 mg total) by mouth daily., Disp: 90 tablet, Rfl: 3 .  omeprazole (PRILOSEC) 20 MG capsule, Take 1 capsule (20 mg total) by mouth 2 (two) times daily before a meal., Disp: 60 capsule, Rfl: 3 .  predniSONE (STERAPRED UNI-PAK 21 TAB) 10 MG (21) TBPK tablet, Use as directed, Disp: 21 tablet, Rfl: 0 .  Vitamin D, Ergocalciferol, (DRISDOL) 50000 units CAPS capsule, TAKE 1 CAPSULE BY MOUTH ONCE A WEEK, Disp: 4 capsule, Rfl: 1 Social History   Socioeconomic History  . Marital status: Married    Spouse name: Not on file  . Number of children: Not on file  . Years of education: Not on file  . Highest education level: Not on file  Occupational History  . Not on file  Social Needs  . Financial resource strain:  Not on file  . Food insecurity:    Worry: Not on file    Inability: Not on file  . Transportation needs:    Medical: Not on file    Non-medical: Not on file  Tobacco Use  . Smoking status: Never Smoker  . Smokeless tobacco: Never Used  Substance and Sexual Activity  . Alcohol use: No  . Drug use: No  . Sexual activity: Yes    Birth control/protection: Surgical  Lifestyle  . Physical activity:    Days per week: Not on file    Minutes per session: Not on file  . Stress: Not on file  Relationships  . Social connections:    Talks on phone: Not on file    Gets together: Not on file    Attends religious service: Not on file    Active member of club or organization: Not on file    Attends meetings of clubs or organizations: Not on file    Relationship status: Not on file  . Intimate partner violence:    Fear of current or ex partner: Not on file    Emotionally abused: Not on file    Physically abused: Not on file    Forced sexual activity: Not on file  Other Topics Concern  . Not on file  Social History Narrative  . Not on file   Family History  Problem Relation Age  of Onset  . Diabetes Mother   . Hypertension Mother   . Pulmonary embolism Sister   . Hypertension Other   . Diabetes Other     Objective: Office vital signs reviewed. BP 113/77   Pulse 88   Temp 98.6 F (37 C) (Oral)   Ht 5' 5.75" (1.67 m)   Wt 221 lb 4 oz (100.4 kg)   BMI 35.98 kg/m   Physical Examination:  General: Awake, alert, well nourished, No acute distress Cardio: regular rate and rhythm, S1S2 heard, no murmurs appreciated Pulm: clear to auscultation bilaterally, no wheezes, rhonchi or rales; normal work of breathing on room air Extremities: warm, well perfused, No edema, cyanosis or clubbing; +2 pulses bilaterally  Assessment/ Plan: 47 y.o. female   1. Pedal edema Symptoms have improved with hydrochlorothiazide but she is having dizzy spells.  I think that her blood pressure would  tolerate PRN use since she is having normal blood pressures at home.  She is to monitor them closely and return if she is having anything above 140/90.  Encouraged use of compression hose.  Will check BMP today given intermittent cramping and dizzy spells to look at electrolytes.  Patient to call the office Monday for lab results.  2. Essential hypertension, benign - Basic Metabolic Panel   Orders Placed This Encounter  Procedures  . Basic Metabolic Panel      Sabrina Norlander, DO [place] [phone]

## 2018-05-19 NOTE — Patient Instructions (Signed)
Call our office on Monday for your blood results.  We are checking your electrolytes that you have had intermittent cramping and dizziness.  I think that your blood pressure is good enough at this point that you can use the hydrochlorothiazide if needed for leg swelling rather than every day.  Keep an eye on your blood pressures.  If you are seeing any blood pressures over 140/90, this would be a reason to see Alyse Low sooner for medication adjustment.  Get the compression hose, this will help with the swelling in your legs.

## 2018-05-20 LAB — BASIC METABOLIC PANEL
BUN / CREAT RATIO: 8 — AB (ref 9–23)
BUN: 8 mg/dL (ref 6–24)
CO2: 22 mmol/L (ref 20–29)
CREATININE: 1.02 mg/dL — AB (ref 0.57–1.00)
Calcium: 9.8 mg/dL (ref 8.7–10.2)
Chloride: 104 mmol/L (ref 96–106)
GFR calc Af Amer: 77 mL/min/{1.73_m2} (ref >59)
GFR, EST NON AFRICAN AMERICAN: 67 mL/min/{1.73_m2} (ref >59)
Glucose: 93 mg/dL (ref 65–99)
Potassium: 3.6 mmol/L (ref 3.5–5.2)
SODIUM: 138 mmol/L (ref 134–144)

## 2018-05-21 ENCOUNTER — Telehealth: Payer: Self-pay | Admitting: Family

## 2018-05-21 NOTE — Telephone Encounter (Signed)
Patient concerned about how much water she should drink daily.  I explained to her she should try to drink 3-5 glasses of water per day.  Patient understands.

## 2018-10-29 ENCOUNTER — Other Ambulatory Visit: Payer: Self-pay | Admitting: Family

## 2018-10-30 NOTE — Telephone Encounter (Signed)
Last Vit D 05/01/18  42.7

## 2018-11-15 ENCOUNTER — Encounter: Payer: Self-pay | Admitting: Family Medicine

## 2018-11-15 ENCOUNTER — Ambulatory Visit (INDEPENDENT_AMBULATORY_CARE_PROVIDER_SITE_OTHER): Payer: 59 | Admitting: Family Medicine

## 2018-11-15 VITALS — BP 141/95 | HR 80 | Temp 98.3°F | Ht 65.75 in | Wt 219.0 lb

## 2018-11-15 DIAGNOSIS — G44209 Tension-type headache, unspecified, not intractable: Secondary | ICD-10-CM | POA: Diagnosis not present

## 2018-11-15 MED ORDER — CYCLOBENZAPRINE HCL 10 MG PO TABS: 10.0000 mg | ORAL_TABLET | Freq: Three times a day (TID) | ORAL | 0 refills | Status: AC | PRN

## 2018-11-15 MED ORDER — NAPROXEN 500 MG PO TABS: 500.0000 mg | ORAL_TABLET | Freq: Two times a day (BID) | ORAL | 1 refills | Status: DC

## 2018-11-15 NOTE — Patient Instructions (Signed)
Tension Headache, Adult  A tension headache is pain, pressure, or aching in your head. Tension headaches can last from 30 minutes to several days.  Follow these instructions at home:  Managing pain   Take over-the-counter and prescription medicines only as told by your doctor.   When you have a headache, lie down in a dark, quiet room.   If told, put ice on your head and neck:  ? Put ice in a plastic bag.  ? Place a towel between your skin and the bag.  ? Leave the ice on for 20 minutes, 2-3 times a day.   If told, put heat on the back of your neck. Do this as often as your doctor tells you to. Use the kind of heat that your doctor recommends, such as a moist heat pack or a heating pad.  ? Place a towel between your skin and the heat.  ? Leave the heat on for 20-30 minutes.  ? Remove the heat if your skin turns bright red.  Eating and drinking   Eat meals on a regular schedule.   Watch how much alcohol you drink:  ? If you are a woman and are not pregnant, do not drink more than 1 drink a day.  ? If you are a man, do not drink more than 2 drinks a day.   Drink enough fluid to keep your pee (urine) pale yellow.   Do not use a lot of caffeine, or stop using caffeine.  Lifestyle   Get enough sleep. Get 7-9 hours of sleep each night. Or get the amount of sleep that your doctor tells you to.   At bedtime, remove all electronic devices from your room. Examples of electronic devices are computers, phones, and tablets.   Find ways to lessen your stress. Some things that can lessen stress are:  ? Exercise.  ? Deep breathing.  ? Yoga.  ? Music.  ? Positive thoughts.   Sit up straight. Do not tighten (tense) your muscles.   Do not use any products that have nicotine or tobacco in them, such as cigarettes and e-cigarettes. If you need help quitting, ask your doctor.  General instructions     Keep all follow-up visits as told by your doctor. This is important.   Avoid things that can bring on headaches. Keep a  journal to find out if certain things bring on headaches. For example, write down:  ? What you eat and drink.  ? How much sleep you get.  ? Any change to your diet or medicines.  Contact a doctor if:   Your headache does not get better.   Your headache comes back.   You have a headache and sounds, light, or smells bother you.   You feel sick to your stomach (nauseous) or you throw up (vomit).   Your stomach hurts.  Get help right away if:   You suddenly get a very bad headache along with any of these:  ? A stiff neck.  ? Feeling sick to your stomach.  ? Throwing up.  ? Feeling weak.  ? Trouble seeing.  ? Feeling short of breath.  ? A rash.  ? Feeling unusually sleepy.  ? Trouble speaking.  ? Pain in your eye or ear.  ? Trouble walking or balancing.  ? Feeling like you will pass out (faint).  ? Passing out.  Summary   A tension headache is pain, pressure, or aching in your head.   Tension   headaches can last from 30 minutes to several days.   Lifestyle changes and medicines may help relieve pain.  This information is not intended to replace advice given to you by your health care provider. Make sure you discuss any questions you have with your health care provider.  Document Released: 12/14/2009 Document Revised: 12/30/2016 Document Reviewed: 12/30/2016  [employer] Interactive Patient Education  2019 [employer]

## 2018-11-15 NOTE — Progress Notes (Signed)
Subjective:    Sabrina [name] is a 48 y.o. female who presents for evaluation of headache. Symptoms began about 7 days ago. Generally, the headaches last about 2 days and occur every day. The headaches do not seem to be related to any time of the day. The headaches are usually moderate, squeezing and pressure like and are located in the occipital area and radiate down neck.  The patient rates her most severe headaches a 8 on a scale from 1 to 10. Recently, the headaches have been decreasing in both severity and frequency. Work attendance or other daily activities are affected by the headaches. Precipitating factors include: none which have been determined. The headaches are usually not preceded by an aura. Associated neurologic symptoms: nausea, tightness in neck and shoulders. The patient denies decreased physical activity, depression, dizziness, loss of balance, muscle weakness, numbness of extremities, speech difficulties, vision problems, vomiting in the early morning and worsening school/work performance. Home treatment has included acetaminophen with some improvement. Other history includes: nothing pertinent. Family history includes no known family members with significant headaches.  The following portions of the patient's history were reviewed and updated as appropriate: allergies, current medications, past family history, past medical history, past social history, past surgical history and problem list.  Review of Systems Constitutional: negative for anorexia, chills, fatigue, fevers, malaise, night sweats and weight loss Eyes: negative Ears, nose, mouth, throat, and face: negative Respiratory: negative Cardiovascular: negative Gastrointestinal: positive for nausea Musculoskeletal:positive for tightness in neck and shoulders Neurological: positive for headaches    Objective:    BP (!) 141/95   Pulse 80   Temp 98.3 F (36.8 C) (Oral)   Ht 5' 5.75" (1.67 m)   Wt 219 lb (99.3  kg)   BMI 35.62 kg/m  General appearance: alert, cooperative, appears stated age and no distress Head: Normocephalic, without obvious abnormality, atraumatic Eyes: negative Ears: normal TM's and external ear canals both ears Nose: Nares normal. Septum midline. Mucosa normal. No drainage or sinus tenderness. Throat: lips, mucosa, and tongue normal; teeth and gums normal Neck: no adenopathy, no carotid bruit, no JVD, supple, symmetrical, trachea midline and thyroid not enlarged, symmetric, no tenderness/mass/nodules Back: symmetric, no curvature. ROM normal. No CVA tenderness., paraspinous muscle tighntess in cervical region Lungs: clear to auscultation bilaterally Heart: regular rate and rhythm, S1, S2 normal, no murmur, click, rub or gallop Skin: Skin color, texture, turgor normal. No rashes or lesions Neurologic: Grossly normal    Assessment:   [name] was seen today for headache.  Diagnoses and all orders for this visit:  Tension headache Medications as prescribed. Report any new or worsening symptoms.  -     cyclobenzaprine (FLEXERIL) 10 MG tablet; Take 1 tablet (10 mg total) by mouth 3 (three) times daily as needed for up to 7 days for muscle spasms. -     naproxen (NAPROSYN) 500 MG tablet; Take 1 tablet (500 mg total) by mouth 2 (two) times daily with a meal.     Plan:    Lie in darkened room and apply cold packs as needed for pain. Episodic therapy: NSAIDs and muscle relaxers due to low frequency of pain. Side effect profile discussed in detail. Avoid alcohol, excessive physical activity, heavy lifting, smoking, stressful situations as much as possible and the use of illicit or street drugs. Patient reassured that neurodiagnostic workup not indicated from benign H&P.   Return in about 4 weeks (around 12/13/2018), or if symptoms worsen or fail to improve.  The above assessment and management plan was discussed with the patient. The patient verbalized understanding of and has  agreed to the management plan. Patient is aware to call the clinic if symptoms fail to improve or worsen. Patient is aware when to return to the clinic for a follow-up visit. Patient educated on when it is appropriate to go to the emergency department.   Monia Pouch, FNP-C [facility] [address] [place], [place] [address] [phone]

## 2018-11-26 ENCOUNTER — Telehealth: Payer: Self-pay | Admitting: Family

## 2018-11-26 NOTE — Telephone Encounter (Signed)
Please advise 

## 2018-11-27 NOTE — Telephone Encounter (Signed)
Patient aware and appt made 

## 2018-11-27 NOTE — Telephone Encounter (Signed)
She needs to make an appointment for a medication change and possible referral to neurology.

## 2018-11-29 ENCOUNTER — Encounter: Payer: Self-pay | Admitting: Family Medicine

## 2018-11-29 ENCOUNTER — Ambulatory Visit (INDEPENDENT_AMBULATORY_CARE_PROVIDER_SITE_OTHER): Payer: 59 | Admitting: Family Medicine

## 2018-11-29 VITALS — BP 133/81 | HR 97 | Temp 98.2°F | Ht 65.75 in | Wt 219.0 lb

## 2018-11-29 DIAGNOSIS — G44209 Tension-type headache, unspecified, not intractable: Secondary | ICD-10-CM | POA: Diagnosis not present

## 2018-11-29 MED ORDER — SUMATRIPTAN SUCCINATE 50 MG PO TABS: 50.0000 mg | ORAL_TABLET | ORAL | 0 refills | Status: DC | PRN

## 2018-11-29 NOTE — Progress Notes (Signed)
Subjective:    Sabrina [name] is a 48 y.o. female who presents for follow-up of tension headaches. Home treatment has included naproxen and flexeril with some improvement. Headaches are occurring less frequently, 4 total since last visit. Generally, the headaches last about several hours. Work attendance or other daily activities are slightly affected by the headaches. The patient denies decreased physical activity, depression, dizziness, loss of balance, muscle weakness, numbness of extremities, speech difficulties, vision problems, vomiting in the early morning and worsening school/work performance.  The following portions of the patient's history were reviewed and updated as appropriate: allergies, current medications, past family history, past medical history, past social history, past surgical history and problem list.  Review of Systems Constitutional: negative Eyes: negative Ears, nose, mouth, throat, and face: negative Respiratory: negative Cardiovascular: negative Gastrointestinal: positive for nausea Musculoskeletal:positive for intermittent neck stiffness Neurological: positive for headaches    Objective:    BP 133/81   Pulse 97   Temp 98.2 F (36.8 C) (Oral)   Ht 5' 5.75" (1.67 m)   Wt 219 lb (99.3 kg)   BMI 35.62 kg/m   General Appearance:    Alert, cooperative, no distress, appears stated age  Head:    Normocephalic, without obvious abnormality, atraumatic  Eyes:    PERRL, conjunctiva/corneas clear, EOM's intact, fundi    benign, both eyes  Ears:    Normal TM's and external ear canals, both ears  Nose:   Nares normal, septum midline, mucosa normal, no drainage    or sinus tenderness  Throat:   Lips, mucosa, and tongue normal; teeth and gums normal  Neck:   Supple, symmetrical, trachea midline, no adenopathy;    thyroid:  no enlargement/tenderness/nodules; no carotid   bruit or JVD  Back:     Symmetric, no curvature, ROM normal, no CVA tenderness  Lungs:      Clear to auscultation bilaterally, respirations unlabored  Chest Wall:    No tenderness or deformity   Heart:    Regular rate and rhythm, S1 and S2 normal, no murmur, rub   or gallop  Breast Exam:    No tenderness, masses, or nipple abnormality  Abdomen:     Soft, non-tender, bowel sounds active all four quadrants,    no masses, no organomegaly  Genitalia:    Normal female without lesion, discharge or tenderness  Rectal:    Normal tone, normal prostate, no masses or tenderness;   guaiac negative stool  Extremities:   Extremities normal, atraumatic, no cyanosis or edema  Pulses:   2+ and symmetric all extremities  Skin:   Skin color, texture, turgor normal, no rashes or lesions  Lymph nodes:   Cervical, supraclavicular, and axillary nodes normal  Neurologic:   CNII-XII intact, normal strength, sensation and reflexes    throughout      Assessment:     Amai was seen today for follow up tension headache.  Diagnoses and all orders for this visit:  Tension headache Failed NSAID and muscle relaxer treatment. Will trial abortive triptan therapy. Pt to keep a headache diary and report any new or worsening symptoms. Reevaluation in 4 weeks. If no improvement, may need referral to neurology or headache clinic.  -     SUMAtriptan (IMITREX) 50 MG tablet; Take 1 tablet (50 mg total) by mouth every 2 (two) hours as needed for migraine. May repeat in 2 hours if headache persists or recurs.     Plan:    Lie in darkened room and  apply cold packs as needed for pain. Episodic therapy: triptan therapy due to low frequency of pain. Side effect profile discussed in detail. Asked to keep headache diary. Avoid alcohol, aspirin and OTC NSAIDs, caffeine, smoking, stressful situations as much as possible and the use of illicit or street drugs. Patient reassured that neurodiagnostic workup not indicated from benign H&P. Follow up in 4 weeks.    The above assessment and management plan was discussed with  the patient. The patient verbalized understanding of and has agreed to the management plan. Patient is aware to call the clinic if symptoms fail to improve or worsen. Patient is aware when to return to the clinic for a follow-up visit. Patient educated on when it is appropriate to go to the emergency department.   Monia Pouch, FNP-C [facility] [address] [place], [place] [address] [phone]

## 2018-11-29 NOTE — Patient Instructions (Signed)
Sumatriptan tablets What is this medicine? SUMATRIPTAN (soo ma TRIP tan) is used to treat migraines with or without aura. An aura is a strange feeling or visual disturbance that warns you of an attack. It is not used to prevent migraines. This medicine may be used for other purposes; ask your health care provider or pharmacist if you have questions. COMMON BRAND NAME(S): Imitrex, Migraine Pack What should I tell my health care provider before I take this medicine? They need to know if you have any of these conditions: -cigarette smoker -circulation problems in fingers and toes -diabetes -heart disease -high blood pressure -high cholesterol -history of irregular heartbeat -history of stroke -kidney disease -liver disease -stomach or intestine problems -an unusual or allergic reaction to sumatriptan, other medicines, foods, dyes, or preservatives -pregnant or trying to get pregnant -breast-feeding How should I use this medicine? Take this medicine by mouth with a glass of water. Follow the directions on the prescription label. Do not take it more often than directed. Talk to your pediatrician regarding the use of this medicine in children. Special care may be needed. Overdosage: If you think you have taken too much of this medicine contact a poison control center or emergency room at once. NOTE: This medicine is only for you. Do not share this medicine with others. What if I miss a dose? This does not apply. This medicine is not for regular use. What may interact with this medicine? Do not take this medicine with any of the following medicines: -certain medicines for migraine headache like almotriptan, eletriptan, frovatriptan, naratriptan, rizatriptan, sumatriptan, zolmitriptan -ergot alkaloids like dihydroergotamine, ergonovine, ergotamine, methylergonovine -MAOIs like Carbex, Eldepryl, Marplan, Nardil, and Parnate This medicine may also interact with the following  medications: -certain medicines for depression, anxiety, or psychotic disorders This list may not describe all possible interactions. Give your health care provider a list of all the medicines, herbs, non-prescription drugs, or dietary supplements you use. Also tell them if you smoke, drink alcohol, or use illegal drugs. Some items may interact with your medicine. What should I watch for while using this medicine? Visit your healthcare professional for regular checks on your progress. Tell your healthcare professional if your symptoms do not start to get better or if they get worse. You may get drowsy or dizzy. Do not drive, use machinery, or do anything that needs mental alertness until you know how this medicine affects you. Do not stand up or sit up quickly, especially if you are an older patient. This reduces the risk of dizzy or fainting spells. Alcohol may interfere with the effect of this medicine. Tell your healthcare professional right away if you have any change in your eyesight. If you take migraine medicines for 10 or more days a month, your migraines may get worse. Keep a diary of headache days and medicine use. Contact your healthcare professional if your migraine attacks occur more frequently. What side effects may I notice from receiving this medicine? Side effects that you should report to your doctor or health care professional as soon as possible: -allergic reactions like skin rash, itching or hives, swelling of the face, lips, or tongue -changes in vision -chest pain or chest tightness -signs and symptoms of a dangerous change in heartbeat or heart rhythm like chest pain; dizziness; fast, irregular heartbeat; palpitations; feeling faint or lightheaded; falls; breathing problems -signs and symptoms of a stroke like changes in vision; confusion; trouble speaking or understanding; severe headaches; sudden numbness or weakness of the face,  dangerous change in heartbeat or heart rhythm like chest pain; dizziness; fast, irregular heartbeat; palpitations; feeling faint or lightheaded; falls; breathing problems  -signs and symptoms of a stroke like changes in vision; confusion; trouble speaking or understanding; severe headaches; sudden numbness or weakness of the face, arm or leg; trouble walking; dizziness; loss of  balance or coordination  -signs and symptoms of serotonin syndrome like irritable; confusion; diarrhea; fast or irregular heartbeat; muscle twitching; stiff muscles; trouble walking; sweating; high fever; seizures; chills; vomiting  Side effects that usually do not require medical attention (report to your doctor or health care professional if they continue or are bothersome):  -diarrhea  -dizziness  -drowsiness  -dry mouth  -headache  -nausea, vomiting  -pain, tingling, numbness in the hands or feet  -stomach pain  This list may not describe all possible side effects. Call your doctor for medical advice about side effects. You may report side effects to FDA at 1-800-FDA-1088.  Where should I keep my medicine?  Keep out of the reach of children.  Store at room temperature between 2 and 30 degrees C (36 and 86 degrees F). Throw away any unused medicine after the expiration date.  NOTE: This sheet is a summary. It may not cover all possible information. If you have questions about this medicine, talk to your doctor, pharmacist, or health care provider.   2019 Elsevier/Gold Standard (2018-04-03 15:05:37)

## 2018-12-04 ENCOUNTER — Other Ambulatory Visit: Payer: Self-pay | Admitting: Family

## 2018-12-05 NOTE — Telephone Encounter (Signed)
Last Vit D 05/01/18  42.7

## 2019-01-31 ENCOUNTER — Other Ambulatory Visit: Payer: Self-pay | Admitting: Family Medicine

## 2019-01-31 DIAGNOSIS — G44209 Tension-type headache, unspecified, not intractable: Secondary | ICD-10-CM

## 2019-02-04 ENCOUNTER — Other Ambulatory Visit: Payer: Self-pay | Admitting: Family

## 2019-04-05 ENCOUNTER — Other Ambulatory Visit: Payer: Self-pay | Admitting: Family

## 2019-04-05 ENCOUNTER — Other Ambulatory Visit: Payer: Self-pay | Admitting: Physician Assistant

## 2019-04-05 DIAGNOSIS — K219 Gastro-esophageal reflux disease without esophagitis: Secondary | ICD-10-CM

## 2019-04-29 ENCOUNTER — Other Ambulatory Visit: Payer: Self-pay | Admitting: Family

## 2019-05-07 ENCOUNTER — Other Ambulatory Visit: Payer: Self-pay | Admitting: Family

## 2019-05-29 ENCOUNTER — Other Ambulatory Visit: Payer: Self-pay

## 2019-05-30 ENCOUNTER — Other Ambulatory Visit: Payer: Self-pay

## 2019-05-30 ENCOUNTER — Encounter: Payer: Self-pay | Admitting: Family

## 2019-05-30 ENCOUNTER — Ambulatory Visit (INDEPENDENT_AMBULATORY_CARE_PROVIDER_SITE_OTHER): Payer: 59 | Admitting: Family

## 2019-05-30 VITALS — BP 131/90 | HR 77 | Temp 97.5°F | Ht 65.75 in | Wt 222.0 lb

## 2019-05-30 DIAGNOSIS — I10 Essential (primary) hypertension: Secondary | ICD-10-CM

## 2019-05-30 DIAGNOSIS — R5383 Other fatigue: Secondary | ICD-10-CM

## 2019-05-30 DIAGNOSIS — E559 Vitamin D deficiency, unspecified: Secondary | ICD-10-CM

## 2019-05-30 DIAGNOSIS — K219 Gastro-esophageal reflux disease without esophagitis: Secondary | ICD-10-CM

## 2019-05-30 DIAGNOSIS — Z0001 Encounter for general adult medical examination with abnormal findings: Secondary | ICD-10-CM | POA: Diagnosis not present

## 2019-05-30 DIAGNOSIS — Z0000 Encounter for general adult medical examination without abnormal findings: Secondary | ICD-10-CM

## 2019-05-30 DIAGNOSIS — E785 Hyperlipidemia, unspecified: Secondary | ICD-10-CM

## 2019-05-30 MED ORDER — AMLODIPINE BESYLATE 5 MG PO TABS: 5.0000 mg | ORAL_TABLET | Freq: Every day | ORAL | 4 refills | Status: DC

## 2019-05-30 MED ORDER — VITAMIN D (ERGOCALCIFEROL) 1.25 MG (50000 UNIT) PO CAPS: 50000.0000 [IU] | ORAL_CAPSULE | ORAL | 3 refills | Status: DC

## 2019-05-30 MED ORDER — OMEPRAZOLE 20 MG PO CPDR: DELAYED_RELEASE_CAPSULE | ORAL | 1 refills | Status: DC

## 2019-05-30 MED ORDER — HYDROCHLOROTHIAZIDE 12.5 MG PO TABS: 12.5000 mg | ORAL_TABLET | Freq: Every day | ORAL | 3 refills | Status: DC

## 2019-05-30 NOTE — Progress Notes (Signed)
Subjective:    Patient ID: Sabrina [name], female    DOB: April 11, 1971, 48 y.o.   MRN: [id number]  Chief Complaint  Patient presents with  . Annual Exam   Pt presents to the office today for CPE without pap.  Hypertension This is a chronic problem. The current episode started more than 1 year ago. The problem has been resolved since onset. The problem is controlled. Pertinent negatives include no headaches, malaise/fatigue, peripheral edema or shortness of breath. Risk factors for coronary artery disease include dyslipidemia, obesity and sedentary lifestyle. The current treatment provides moderate improvement. There is no history of kidney disease, CAD/MI or heart failure.  Hyperlipidemia This is a chronic problem. The current episode started more than 1 year ago. The problem is uncontrolled. Recent lipid tests were reviewed and are high. Exacerbating diseases include obesity. Pertinent negatives include no shortness of breath. Current antihyperlipidemic treatment includes statins and diet change. The current treatment provides mild improvement of lipids. Risk factors for coronary artery disease include dyslipidemia, hypertension and a sedentary lifestyle.  Gastroesophageal Reflux She complains of belching and heartburn. She reports no nausea. This is a chronic problem. The current episode started more than 1 year ago. The problem occurs occasionally. The problem has been waxing and waning. She has tried a PPI for the symptoms. The treatment provided moderate relief.  Headache  This is a recurrent problem. The problem occurs intermittently. The problem has been gradually improving (has not had a headache in 2 months). The pain quality is similar to prior headaches. Associated symptoms include phonophobia and photophobia. Pertinent negatives include no nausea. She has tried triptans for the symptoms. The treatment provided moderate relief. Her past medical history is significant for hypertension and  obesity.      Review of Systems  Constitutional: Negative for malaise/fatigue.  Eyes: Positive for photophobia.  Respiratory: Negative for shortness of breath.   Gastrointestinal: Positive for heartburn. Negative for nausea.  Neurological: Negative for headaches.  All other systems reviewed and are negative.      Objective:   Physical Exam Vitals signs reviewed.  Constitutional:      General: She is not in acute distress.    Appearance: She is well-developed.  HENT:     Head: Normocephalic and atraumatic.     Right Ear: Tympanic membrane normal.     Left Ear: Tympanic membrane normal.  Eyes:     Pupils: Pupils are equal, round, and reactive to light.  Neck:     Musculoskeletal: Normal range of motion and neck supple.     Thyroid: No thyromegaly.  Cardiovascular:     Rate and Rhythm: Normal rate and regular rhythm.     Heart sounds: Normal heart sounds. No murmur.  Pulmonary:     Effort: Pulmonary effort is normal. No respiratory distress.     Breath sounds: Normal breath sounds. No wheezing.  Abdominal:     General: Bowel sounds are normal. There is no distension.     Palpations: Abdomen is soft.     Tenderness: There is no abdominal tenderness.  Musculoskeletal: Normal range of motion.        General: No tenderness.  Skin:    General: Skin is warm and dry.  Neurological:     Mental Status: She is alert and oriented to person, place, and time.     Cranial Nerves: No cranial nerve deficit.     Deep Tendon Reflexes: Reflexes are normal and symmetric.  Psychiatric:  Behavior: Behavior normal.        Thought Content: Thought content normal.        Judgment: Judgment normal.       BP (!) 146/100   Pulse 86   Temp (!) 97.5 F (36.4 C) (Temporal)   Ht 5' 5.75" (1.67 m)   Wt 222 lb (100.7 kg)   LMP 05/09/2019   BMI 36.10 kg/m      Assessment & Plan:  Sabrina [name] comes in today with chief complaint of Annual Exam   Diagnosis and orders  addressed:  1. Annual physical exam - Anemia Profile B - CMP14+EGFR - Lipid panel - TSH - VITAMIN D 25 Hydroxy (Vit-D Deficiency, Fractures)  2. Essential hypertension, benign - Anemia Profile B - CMP14+EGFR - hydrochlorothiazide (HYDRODIURIL) 12.5 MG tablet; Take 1 tablet (12.5 mg total) by mouth daily.  Dispense: 90 tablet; Refill: 3 - amLODipine (NORVASC) 5 MG tablet; Take 1 tablet (5 mg total) by mouth daily.  Dispense: 90 tablet; Refill: 4  3. Gastroesophageal reflux disease, esophagitis presence not specified - Anemia Profile B - CMP14+EGFR - omeprazole (PRILOSEC) 20 MG capsule; TAKE 1 CAPSULE BY MOUTH TWICE DAILY BEFORE MEAL(S)  Dispense: 180 capsule; Refill: 1  4. Vitamin D deficiency - Anemia Profile B - CMP14+EGFR - VITAMIN D 25 Hydroxy (Vit-D Deficiency, Fractures)  5. Morbid obesity ([place]) - Anemia Profile B - CMP14+EGFR  6. Hyperlipidemia, unspecified hyperlipidemia type - Anemia Profile B - CMP14+EGFR  7. Other fatigue - Anemia Profile B - CMP14+EGFR - TSH - VITAMIN D 25 Hydroxy (Vit-D Deficiency, Fractures)   Labs pending Health Maintenance reviewed Diet and exercise encouraged  Follow up plan: 1 year    Evelina Dun, FNP

## 2019-05-30 NOTE — Patient Instructions (Signed)
Health Maintenance, Female Adopting a healthy lifestyle and getting preventive care are important in promoting health and wellness. Ask your health care provider about:  The right schedule for you to have regular tests and exams.  Things you can do on your own to prevent diseases and keep yourself healthy. What should I know about diet, weight, and exercise? Eat a healthy diet   Eat a diet that includes plenty of vegetables, fruits, low-fat dairy products, and lean protein.  Do not eat a lot of foods that are high in solid fats, added sugars, or sodium. Maintain a healthy weight Body mass index (BMI) is used to identify weight problems. It estimates body fat based on height and weight. Your health care provider can help determine your BMI and help you achieve or maintain a healthy weight. Get regular exercise Get regular exercise. This is one of the most important things you can do for your health. Most adults should:  Exercise for at least 150 minutes each week. The exercise should increase your heart rate and make you sweat (moderate-intensity exercise).  Do strengthening exercises at least twice a week. This is in addition to the moderate-intensity exercise.  Spend less time sitting. Even light physical activity can be beneficial. Watch cholesterol and blood lipids Have your blood tested for lipids and cholesterol at 46 years of age, then have this test every 5 years. Have your cholesterol levels checked more often if:  Your lipid or cholesterol levels are high.  You are older than 46 years of age.  You are at high risk for heart disease. What should I know about cancer screening? Depending on your health history and family history, you may need to have cancer screening at various ages. This may include screening for:  Breast cancer.  Cervical cancer.  Colorectal cancer.  Skin cancer.  Lung cancer. What should I know about heart disease, diabetes, and high blood  pressure? Blood pressure and heart disease  High blood pressure causes heart disease and increases the risk of stroke. This is more likely to develop in people who have high blood pressure readings, are of African descent, or are overweight.  Have your blood pressure checked: ? Every 3-5 years if you are 18-39 years of age. ? Every year if you are 40 years old or older. Diabetes Have regular diabetes screenings. This checks your fasting blood sugar level. Have the screening done:  Once every three years after age 40 if you are at a normal weight and have a low risk for diabetes.  More often and at a younger age if you are overweight or have a high risk for diabetes. What should I know about preventing infection? Hepatitis B If you have a higher risk for hepatitis B, you should be screened for this virus. Talk with your health care provider to find out if you are at risk for hepatitis B infection. Hepatitis C Testing is recommended for:  Everyone born from 1945 through 1965.  Anyone with known risk factors for hepatitis C. Sexually transmitted infections (STIs)  Get screened for STIs, including gonorrhea and chlamydia, if: ? You are sexually active and are younger than 46 years of age. ? You are older than 46 years of age and your health care provider tells you that you are at risk for this type of infection. ? Your sexual activity has changed since you were last screened, and you are at increased risk for chlamydia or gonorrhea. Ask your health care provider if   you are at risk.  Ask your health care provider about whether you are at high risk for HIV. Your health care provider may recommend a prescription medicine to help prevent HIV infection. If you choose to take medicine to prevent HIV, you should first get tested for HIV. You should then be tested every 3 months for as long as you are taking the medicine. Pregnancy  If you are about to stop having your period (premenopausal) and  you may become pregnant, seek counseling before you get pregnant.  Take 400 to 800 micrograms (mcg) of folic acid every day if you become pregnant.  Ask for birth control (contraception) if you want to prevent pregnancy. Osteoporosis and menopause Osteoporosis is a disease in which the bones lose minerals and strength with aging. This can result in bone fractures. If you are 65 years old or older, or if you are at risk for osteoporosis and fractures, ask your health care provider if you should:  Be screened for bone loss.  Take a calcium or vitamin D supplement to lower your risk of fractures.  Be given hormone replacement therapy (HRT) to treat symptoms of menopause. Follow these instructions at home: Lifestyle  Do not use any products that contain nicotine or tobacco, such as cigarettes, e-cigarettes, and chewing tobacco. If you need help quitting, ask your health care provider.  Do not use street drugs.  Do not share needles.  Ask your health care provider for help if you need support or information about quitting drugs. Alcohol use  Do not drink alcohol if: ? Your health care provider tells you not to drink. ? You are pregnant, may be pregnant, or are planning to become pregnant.  If you drink alcohol: ? Limit how much you use to 0-1 drink a day. ? Limit intake if you are breastfeeding.  Be aware of how much alcohol is in your drink. In the [place] one drink equals one 12 oz bottle of beer (355 mL), one 5 oz glass of wine (148 mL), or one 1 oz glass of hard liquor (44 mL). General instructions  Schedule regular health, dental, and eye exams.  Stay current with your vaccines.  Tell your health care provider if: ? You often feel depressed. ? You have ever been abused or do not feel safe at home. Summary  Adopting a healthy lifestyle and getting preventive care are important in promoting health and wellness.  Follow your health care provider's instructions about healthy  diet, exercising, and getting tested or screened for diseases.  Follow your health care provider's instructions on monitoring your cholesterol and blood pressure. This information is not intended to replace advice given to you by your health care provider. Make sure you discuss any questions you have with your health care provider. Document Released: 04/04/2011 Document Revised: 09/12/2018 Document Reviewed: 09/12/2018 Elsevier Patient Education  2020 [employer]  

## 2019-05-31 LAB — VITAMIN D 25 HYDROXY (VIT D DEFICIENCY, FRACTURES): Vit D, 25-Hydroxy: 35.3 ng/mL (ref 30.0–100.0)

## 2019-05-31 LAB — CMP14+EGFR
ALT: 42 IU/L — ABNORMAL HIGH (ref 0–32)
AST: 23 IU/L (ref 0–40)
Albumin/Globulin Ratio: 1.8 (ref 1.2–2.2)
Albumin: 4.3 g/dL (ref 3.8–4.8)
Alkaline Phosphatase: 72 IU/L (ref 39–117)
BUN/Creatinine Ratio: 5 — ABNORMAL LOW (ref 9–23)
BUN: 5 mg/dL — ABNORMAL LOW (ref 6–24)
Bilirubin Total: 0.4 mg/dL (ref 0.0–1.2)
CO2: 23 mmol/L (ref 20–29)
Calcium: 10.6 mg/dL — ABNORMAL HIGH (ref 8.7–10.2)
Chloride: 102 mmol/L (ref 96–106)
Creatinine, Ser: 0.95 mg/dL (ref 0.57–1.00)
GFR calc Af Amer: 83 mL/min/{1.73_m2} (ref >59)
GFR calc non Af Amer: 72 mL/min/{1.73_m2} (ref >59)
Globulin, Total: 2.4 g/dL (ref 1.5–4.5)
Glucose: 85 mg/dL (ref 65–99)
Potassium: 5.1 mmol/L (ref 3.5–5.2)
Sodium: 139 mmol/L (ref 134–144)
Total Protein: 6.7 g/dL (ref 6.0–8.5)

## 2019-05-31 LAB — ANEMIA PROFILE B
Basophils Absolute: 0 10*3/uL (ref 0.0–0.2)
Basos: 1 %
EOS (ABSOLUTE): 0 10*3/uL (ref 0.0–0.4)
Eos: 1 %
Ferritin: 22 ng/mL (ref 15–150)
Folate: 3.2 ng/mL (ref >3.0)
Hematocrit: 37.8 % (ref 34.0–46.6)
Hemoglobin: 12.7 g/dL (ref 11.1–15.9)
Immature Grans (Abs): 0 10*3/uL (ref 0.0–0.1)
Immature Granulocytes: 0 %
Iron Saturation: 18 % (ref 15–55)
Iron: 72 ug/dL (ref 27–159)
Lymphocytes Absolute: 1.8 10*3/uL (ref 0.7–3.1)
Lymphs: 28 %
MCH: 30 pg (ref 26.6–33.0)
MCHC: 33.6 g/dL (ref 31.5–35.7)
MCV: 89 fL (ref 79–97)
Monocytes Absolute: 0.4 10*3/uL (ref 0.1–0.9)
Monocytes: 6 %
Neutrophils Absolute: 4 10*3/uL (ref 1.4–7.0)
Neutrophils: 64 %
Platelets: 328 10*3/uL (ref 150–450)
RBC: 4.23 x10E6/uL (ref 3.77–5.28)
RDW: 11.6 % — ABNORMAL LOW (ref 11.7–15.4)
Retic Ct Pct: 1.9 % (ref 0.6–2.6)
Total Iron Binding Capacity: 411 ug/dL (ref 250–450)
UIBC: 339 ug/dL (ref 131–425)
Vitamin B-12: 308 pg/mL (ref 232–1245)
WBC: 6.3 10*3/uL (ref 3.4–10.8)

## 2019-05-31 LAB — LIPID PANEL
Chol/HDL Ratio: 3.5 ratio (ref 0.0–4.4)
Cholesterol, Total: 180 mg/dL (ref 100–199)
HDL: 51 mg/dL (ref >39)
LDL Calculated: 110 mg/dL — ABNORMAL HIGH (ref 0–99)
Triglycerides: 93 mg/dL (ref 0–149)
VLDL Cholesterol Cal: 19 mg/dL (ref 5–40)

## 2019-05-31 LAB — TSH: TSH: 2.31 u[IU]/mL (ref 0.450–4.500)

## 2019-08-13 ENCOUNTER — Other Ambulatory Visit: Payer: Self-pay

## 2019-08-13 ENCOUNTER — Encounter (HOSPITAL_COMMUNITY): Payer: Self-pay | Admitting: Emergency Medicine

## 2019-08-13 ENCOUNTER — Emergency Department (HOSPITAL_BASED_OUTPATIENT_CLINIC_OR_DEPARTMENT_OTHER)
Admit: 2019-08-13 | Discharge: 2019-08-13 | Disposition: A | Discharge status: Home / Self Care | Payer: 59 | Attending: Emergency Medicine | Admitting: Emergency Medicine

## 2019-08-13 ENCOUNTER — Emergency Department (HOSPITAL_COMMUNITY)
Admission: EM | Admit: 2019-08-13 | Discharge: 2019-08-13 | Disposition: A | Discharge status: Home / Self Care | Payer: 59 | Attending: Emergency Medicine | Admitting: Emergency Medicine

## 2019-08-13 DIAGNOSIS — M79604 Pain in right leg: Secondary | ICD-10-CM | POA: Diagnosis present

## 2019-08-13 DIAGNOSIS — R52 Pain, unspecified: Secondary | ICD-10-CM | POA: Diagnosis not present

## 2019-08-13 DIAGNOSIS — I10 Essential (primary) hypertension: Secondary | ICD-10-CM | POA: Insufficient documentation

## 2019-08-13 DIAGNOSIS — Z79899 Other long term (current) drug therapy: Secondary | ICD-10-CM | POA: Insufficient documentation

## 2019-08-13 MED ORDER — LIDOCAINE 5 % EX PTCH: 1.0000 | MEDICATED_PATCH | CUTANEOUS | 0 refills | Status: DC

## 2019-08-13 MED ORDER — METHOCARBAMOL 500 MG PO TABS: 500.0000 mg | ORAL_TABLET | Freq: Two times a day (BID) | ORAL | 0 refills | Status: DC

## 2019-08-13 NOTE — ED Notes (Signed)
US at bedside

## 2019-08-13 NOTE — Progress Notes (Signed)
Right lower extremity venous duplex has been completed. Preliminary results can be found in CV Proc through chart review.  Results were given to Arlean Hopping PA.  08/13/19 5:33 PM Sabrina [name] RVT

## 2019-08-13 NOTE — ED Provider Notes (Signed)
[facility] DEPT Provider Note   CSN: [id number] Arrival date & time: 08/13/19  1414     History   Chief Complaint Chief Complaint  Patient presents with   Leg Pain    HPI Sabrina [name] is a 48 y.o. female.     HPI   Sabrina [name] is a 48 y.o. female, with a history of HTN, hyperlipidemia, obesity, presenting to the ED with right leg pain beginning yesterday.  Pain started while she was packing boxes.  It was initially sharp, extended down the right lateral thigh, lasted for approximately 15 minutes, and then was replaced with soreness.  She woke up this morning with continued soreness, but in the medial and posterior thigh, currently mild to moderate, nonradiating.  She called her nurse access line for [employer].  They voiced concern for DVT and advised her to come to the emergency room for ultrasound. Patient denies history of PE/DVT, recent surgery, recent immobilization, recent trauma, hormone therapy.  Denies fever/chills, weakness, numbness, falls, leg swelling, color change, shortness of breath, chest pain, or any other complaints.   Past Medical History:  Diagnosis Date   Hypertension     Patient Active Problem List   Diagnosis Date Noted   Hyperlipemia 05/01/2018   Morbid obesity ([place]) 05/01/2018   GERD (gastroesophageal reflux disease) 02/22/2017   Vitamin D deficiency 04/25/2016   Essential hypertension, benign 07/05/2013    Past Surgical History:  Procedure Laterality Date   CESAREAN SECTION     ECTOPIC PREGNANCY SURGERY       OB History   No obstetric history on file.      Home Medications    Prior to Admission medications   Medication Sig Start Date End Date Taking? Authorizing Provider  amLODipine (NORVASC) 5 MG tablet Take 1 tablet (5 mg total) by mouth daily. 05/30/19   Evelina Dun A, FNP  hydrochlorothiazide (HYDRODIURIL) 12.5 MG tablet Take 1 tablet (12.5 mg total) by mouth daily.  05/30/19   Evelina Dun A, FNP  lidocaine (LIDODERM) 5 % Place 1 patch onto the skin daily. Remove & Discard patch within 12 hours or as directed by MD 08/13/19   Faryal Marxen C, PA-C  methocarbamol (ROBAXIN) 500 MG tablet Take 1 tablet (500 mg total) by mouth 2 (two) times daily. 08/13/19   Sheila Gervasi C, PA-C  naproxen (NAPROSYN) 500 MG tablet Take 1 tablet (500 mg total) by mouth 2 (two) times daily with a meal. 11/15/18   Rakes, Connye Burkitt, FNP  omeprazole (PRILOSEC) 20 MG capsule TAKE 1 CAPSULE BY MOUTH TWICE DAILY BEFORE MEAL(S) 05/30/19   Hawks, Alyse Low A, FNP  SUMAtriptan (IMITREX) 50 MG tablet Take 1 tablet (50 mg total) by mouth every 2 (two) hours as needed for migraine. May repeat in 2 hours if headache persists or recurs. 11/29/18   Baruch Gouty, FNP  Vitamin D, Ergocalciferol, (DRISDOL) 1.25 MG (50000 UT) CAPS capsule Take 1 capsule (50,000 Units total) by mouth once a week. 05/30/19   [name], FNP    Family History Family History  Problem Relation Age of Onset   Diabetes Mother    Hypertension Mother    Pulmonary embolism Sister    Hypertension Other    Diabetes Other     Social History Social History   Tobacco Use   Smoking status: Never Smoker   Smokeless tobacco: Never Used  Substance Use Topics   Alcohol use: No   Drug use: No  Allergies   Patient has no known allergies.   Review of Systems Review of Systems  Constitutional: Negative for chills and fever.  Respiratory: Negative for shortness of breath.   Cardiovascular: Negative for chest pain and leg swelling.  Gastrointestinal: Negative for abdominal pain, nausea and vomiting.  Musculoskeletal: Positive for myalgias.  Neurological: Negative for weakness and numbness.  All other systems reviewed and are negative.    Physical Exam Updated Vital Signs BP (!) 146/98 (BP Location: Left Arm)    Pulse 86    Temp 99.2 F (37.3 C) (Oral)    Resp 16    LMP 07/30/2019 (Approximate)    SpO2  100%   Physical Exam Vitals signs and nursing note reviewed.  Constitutional:      General: She is not in acute distress.    Appearance: She is well-developed. She is not diaphoretic.  HENT:     Head: Normocephalic and atraumatic.     Mouth/Throat:     Mouth: Mucous membranes are moist.     Pharynx: Oropharynx is clear.  Eyes:     Conjunctiva/sclera: Conjunctivae normal.  Neck:     Musculoskeletal: Neck supple.  Cardiovascular:     Rate and Rhythm: Normal rate and regular rhythm.     Pulses: Normal pulses.          Radial pulses are 2+ on the right side and 2+ on the left side.       Posterior tibial pulses are 2+ on the right side and 2+ on the left side.     Comments: Tactile temperature in the extremities appropriate and equal bilaterally. Pulmonary:     Effort: Pulmonary effort is normal. No respiratory distress.     Breath sounds: Normal breath sounds.  Abdominal:     Tenderness: There is no guarding.  Musculoskeletal:     Right lower leg: No edema.     Left lower leg: No edema.     Comments: Mild tenderness to the posterior right thigh without swelling, skin changes, increased warmth, color change. Full range of motion without noted difficulty in the right hip and knee.  Lymphadenopathy:     Cervical: No cervical adenopathy.  Skin:    General: Skin is warm and dry.  Neurological:     Mental Status: She is alert.     Comments: Sensation to light touch grossly intact in the right lower extremity. Strength 5/5 in the right lower extremity. Ambulatory without noted gait deficit.  Psychiatric:        Mood and Affect: Mood and affect normal.        Speech: Speech normal.        Behavior: Behavior normal.      ED Treatments / Results  Labs (all labs ordered are listed, but only abnormal results are displayed) Labs Reviewed - No data to display  EKG None  Radiology Vas [place] Lower Extremity Venous (dvt) (only [facility] 7a-7p)  Result Date: 08/13/2019  Lower  Venous Study Indications: Pain.  Risk Factors: None identified. Limitations: Body habitus and poor ultrasound/tissue interface. Comparison Study: No prior studies. Performing Technologist: Oliver Hum RVT  Examination Guidelines: A complete evaluation includes B-mode imaging, spectral Doppler, color Doppler, and power Doppler as needed of all accessible portions of each vessel. Bilateral testing is considered an integral part of a complete examination. Limited examinations for reoccurring indications may be performed as noted.  +---------+---------------+---------+-----------+----------+--------------+  RIGHT     Compressibility Phasicity Spontaneity Properties Thrombus Aging  +---------+---------------+---------+-----------+----------+--------------+  CFV       Full            Yes       Yes                                    +---------+---------------+---------+-----------+----------+--------------+  SFJ       Full                                                             +---------+---------------+---------+-----------+----------+--------------+  FV Prox   Full                                                             +---------+---------------+---------+-----------+----------+--------------+  FV Mid    Full                                                             +---------+---------------+---------+-----------+----------+--------------+  FV Distal Full                                                             +---------+---------------+---------+-----------+----------+--------------+  PFV       Full                                                             +---------+---------------+---------+-----------+----------+--------------+  POP       Full            Yes       Yes                                    +---------+---------------+---------+-----------+----------+--------------+  PTV       Full                                                              +---------+---------------+---------+-----------+----------+--------------+  PERO      Full                                                             +---------+---------------+---------+-----------+----------+--------------+   +----+---------------+---------+-----------+----------+--------------+  LEFT Compressibility Phasicity Spontaneity Properties Thrombus Aging  +----+---------------+---------+-----------+----------+--------------+  CFV  Full            Yes       Yes                                    +----+---------------+---------+-----------+----------+--------------+     Summary: Right: There is no evidence of deep vein thrombosis in the lower extremity. However, portions of this examination were limited- see technologist comments above. No cystic structure found in the popliteal fossa. Left: No evidence of common femoral vein obstruction.  *See table(s) above for measurements and observations. Electronically signed by Harold Barban MD on 08/13/2019 at 5:58:02 PM.    Final     Procedures Procedures (including critical care time)  Medications Ordered in ED Medications - No data to display   Initial Impression / Assessment and Plan / ED Course  I have reviewed the triage vital signs and the nursing notes.  Pertinent labs & imaging results that were available during my care of the patient were reviewed by me and considered in my medical decision making (see chart for details).        Patient presents with right leg pain.  Symptoms are most likely to be consistent with pain of muscular origin.  No DVT noted on ultrasound, however, there were limitations mentioned.  Possible limitations of the study were discussed with the patient. PCP follow-up for any further management. The patient was given instructions for home care as well as return precautions. Patient voices understanding of these instructions, accepts the plan, and is comfortable with discharge.   Final Clinical Impressions(s)  / ED Diagnoses   Final diagnoses:  Right leg pain    ED Discharge Orders         Ordered    lidocaine (LIDODERM) 5 %  Every 24 hours     08/13/19 1741    methocarbamol (ROBAXIN) 500 MG tablet  2 times daily     08/13/19 1741           Denorris Reust C, PA-C 08/13/19 1808    Lacretia Leigh, MD 08/15/19 [phone]

## 2019-08-13 NOTE — Discharge Instructions (Signed)
°  Take it easy, but do not lay around too much as this may make any stiffness worse.  Antiinflammatory medications: Take 600 mg of ibuprofen every 6 hours or 440 mg (over the counter dose) to 500 mg (prescription dose) of naproxen every 12 hours for the next 3 days. After this time, these medications may be used as needed for pain. Take these medications with food to avoid upset stomach. Choose only one of these medications, do not take them together. Acetaminophen (generic for Tylenol): Should you continue to have additional pain while taking the ibuprofen or naproxen, you may add in acetaminophen as needed. Your daily total maximum amount of acetaminophen from all sources should be limited to 4000mg /day for persons without liver problems, or 2000mg /day for those with liver problems. Methocarbamol: Methocarbamol (generic for Robaxin) is a muscle relaxer and can help relieve stiff muscles or muscle spasms.  Do not drive or perform other dangerous activities while taking this medication as it can cause drowsiness as well as changes in reaction time and judgement. Lidocaine patches: These are available via either prescription or over-the-counter. The over-the-counter option may be more economical one and are likely just as effective. There are multiple over-the-counter brands, such as Salonpas. Exercises: Be sure to perform the attached exercises starting with three times a week and working up to performing them daily. This is an essential part of preventing long term problems.  Follow up: Follow up with a primary care provider for any future management of these complaints. Be sure to follow up within 7-10 days. Return: Return to the ED should symptoms worsen.  For prescription assistance, may try using prescription discount sites or apps, such as goodrx.com

## 2019-08-13 NOTE — ED Triage Notes (Signed)
Pt states that yesterday she had 5-10 mins of shooting pain in her R upper quad area and today that area is sore. Alert and oriented.

## 2019-11-26 ENCOUNTER — Ambulatory Visit (INDEPENDENT_AMBULATORY_CARE_PROVIDER_SITE_OTHER): Payer: 59 | Admitting: Gastroenterology

## 2019-11-26 ENCOUNTER — Encounter: Payer: Self-pay | Admitting: Gastroenterology

## 2019-11-26 ENCOUNTER — Telehealth: Payer: Self-pay

## 2019-11-26 ENCOUNTER — Other Ambulatory Visit: Payer: Self-pay

## 2019-11-26 DIAGNOSIS — R1013 Epigastric pain: Secondary | ICD-10-CM | POA: Diagnosis not present

## 2019-11-26 DIAGNOSIS — Z1211 Encounter for screening for malignant neoplasm of colon: Secondary | ICD-10-CM

## 2019-11-26 MED ORDER — SUPREP BOWEL PREP KIT 17.5-3.13-1.6 GM/177ML PO SOLN: 1.0000 | ORAL | 0 refills | Status: DC

## 2019-11-26 NOTE — Telephone Encounter (Signed)
PA for TCS/EGD submitted via [facility] website. Case approved. PA# [id number], valid 12/04/19-03/03/20.

## 2019-11-26 NOTE — H&P (View-Only) (Signed)
Primary Care Physician:  [name], FNP  Referring Physician: Stevan Born, FNP Primary Gastroenterologist:  Dr. Gala Romney   Chief Complaint  Patient presents with  . Rectal Bleeding    not sure if blood was coming from rectum    HPI:   Sabrina [name] is a 49 y.o. female presenting today at the request of [facility] due to reported rectal bleeding and history of H.pylori in the past.    Hgb 13.2 on outside labs dated 2/20. Iron 78. No ferritin. Heme negative X 1.   Onset of upper abdominal pain one week ago, radiating down left side and then over to the right side with pressure.  Very nauseated at that time. Started seeing blood on the 15th. Started off as lighter blood. When wiping for BM saw no rectal bleeding. Only with wiping to urinate she saw it. Yesterday was last episode of bleeding. Covered the pad for about 5 days.  Had finished her cycle on 2/8th. Years ago had more spread out periods (up to 2 months). For last 10 years has been consistent. Pain is improved. Sunday had one slice of pizza and Pepsi then got sick and weak feeling in upper abdomen. On Monday was afraid to eat. Eating pickles calmed her stomach down. 2 jars of pickles this week. Has been taking omeprazole but wasn't helping. Tuesday had Mylanta, which didn't do much either. Thursday morning tried pepto tablets X 1. Friday saw the black stool after taking pepto. BMs are now brown. Earlier during the week was pale yellow. Has been taking Advil for aches and pains, back pain. Hasn't had any further Advil for the past week. Was taking Advil at night.   Hasn't seen GYN in 11 years.   Now with upper abdominal discomfort that radiates down left side. Did blood test last year for H.pylori after having epigastric pain. Treated for H.pylori and pain improved. Omeprazole daily prior to this. No typical GERD symptoms. No prior EGD. No prior colonoscopy. Maternal grandfather: colon cancer. No family  history of polyps.   She was prescribed another course of antibiotics empirically per Urgent Care for presumed H.pylori infection.   Past Medical History:  Diagnosis Date  . Hypertension     Past Surgical History:  Procedure Laterality Date  . CESAREAN SECTION    . ECTOPIC PREGNANCY SURGERY      Current Outpatient Medications  Medication Sig Dispense Refill  . amLODipine (NORVASC) 5 MG tablet Take 1 tablet (5 mg total) by mouth daily. 90 tablet 4  . amoxicillin (AMOXIL) 500 MG capsule Take 1,000 mg by mouth 2 (two) times daily.     . clarithromycin (BIAXIN) 500 MG tablet Take 500 mg by mouth 2 (two) times daily.    . hydrochlorothiazide (HYDRODIURIL) 12.5 MG tablet Take 1 tablet (12.5 mg total) by mouth daily. (Patient taking differently: Take 12.5 mg by mouth daily as needed (fluid). ) 90 tablet 3  . pantoprazole (PROTONIX) 20 MG tablet Take 20 mg by mouth 2 (two) times daily. For 14 days    . Probiotic Product (PROBIOTIC DAILY PO) Take 1 tablet by mouth daily. 5 billion    . Vitamin D, Ergocalciferol, (DRISDOL) 1.25 MG (50000 UT) CAPS capsule Take 1 capsule (50,000 Units total) by mouth once a week. 12 capsule 3  . fluconazole (DIFLUCAN) 150 MG tablet Take 150 mg by mouth daily as needed (yeast).    . Na Sulfate-K Sulfate-Mg Sulf (SUPREP BOWEL PREP KIT)  17.5-3.13-1.6 GM/177ML SOLN Take 1 kit by mouth as directed. 354 mL 0  . omeprazole (PRILOSEC) 20 MG capsule Take 20 mg by mouth daily.    . ondansetron (ZOFRAN-ODT) 4 MG disintegrating tablet Take 4 mg by mouth every 8 (eight) hours as needed.     No current facility-administered medications for this visit.    Allergies as of 11/26/2019  . (No Known Allergies)    Family History  Problem Relation Age of Onset  . Diabetes Mother   . Hypertension Mother   . Pulmonary embolism Sister   . Hypertension Other   . Diabetes Other     Social History   Socioeconomic History  . Marital status: Married    Spouse name: Not on  file  . Number of children: Not on file  . Years of education: Not on file  . Highest education level: Not on file  Occupational History  . Not on file  Tobacco Use  . Smoking status: Never Smoker  . Smokeless tobacco: Never Used  Substance and Sexual Activity  . Alcohol use: No  . Drug use: No  . Sexual activity: Yes    Birth control/protection: Surgical  Other Topics Concern  . Not on file  Social History Narrative  . Not on file   Social Determinants of Health   Financial Resource Strain:   . Difficulty of Paying Living Expenses: Not on file  Food Insecurity:   . Worried About Charity fundraiser in the Last Year: Not on file  . Ran Out of Food in the Last Year: Not on file  Transportation Needs:   . Lack of Transportation (Medical): Not on file  . Lack of Transportation (Non-Medical): Not on file  Physical Activity:   . Days of Exercise per Week: Not on file  . Minutes of Exercise per Session: Not on file  Stress:   . Feeling of Stress : Not on file  Social Connections:   . Frequency of Communication with Friends and Family: Not on file  . Frequency of Social Gatherings with Friends and Family: Not on file  . Attends Religious Services: Not on file  . Active Member of Clubs or Organizations: Not on file  . Attends Archivist Meetings: Not on file  . Marital Status: Not on file  Intimate Partner Violence:   . Fear of Current or Ex-Partner: Not on file  . Emotionally Abused: Not on file  . Physically Abused: Not on file  . Sexually Abused: Not on file    Review of Systems: Gen: Denies any fever, chills, fatigue, weight loss, lack of appetite.  CV: Denies chest pain, heart palpitations, peripheral edema, syncope.  Resp: Denies shortness of breath at rest or with exertion. Denies wheezing or cough.  GI: see HPI GU : Denies urinary burning, urinary frequency, urinary hesitancy MS: Denies joint pain, muscle weakness, cramps, or limitation of movement.    Derm: Denies rash, itching, dry skin Psych: Denies depression, anxiety, memory loss, and confusion Heme: see HPI  Physical Exam: BP 140/87   Pulse 78   Temp (!) 97.1 F (36.2 C) (Temporal)   Ht _0  (1.651 m)   Wt 220 lb (99.8 kg)   LMP 11/11/2019 (Approximate)   BMI 36.61 kg/m  General:   Alert and oriented. Pleasant and cooperative. Well-nourished and well-developed.  Head:  Normocephalic and atraumatic. Eyes:  Without icterus, sclera clear and conjunctiva pink.  Ears:  Normal auditory acuity. Lungs:  Clear to  auscultation bilaterally. No wheezes, rales, or rhonchi. No distress.  Heart:  S1, S2 present without murmurs appreciated.  Abdomen:  +BS, soft, mild TTP upper abdomen and non-distended. No HSM noted. No guarding or rebound. No masses appreciated.  Rectal:  Deferred  Msk:  Symmetrical without gross deformities. Normal posture. Extremities:  Without edema. Neurologic:  Alert and  oriented x4;  grossly normal neurologically. Psych:  Alert and cooperative. Normal mood and affect.    ASSESSMENT: Sabrina [name] is a 49 y.o. female presenting today with epigastric pain, nausea, previously taking Advil routinely, reports of vaginal bleeding, history of +H.pylori serology several years ago s/p treatment and now started empirically on repeat regimen for H.pylori by Urgent Care.   Epigastric pain: doubt biliary but unable to exclude. Hgb normal. No melena. Heme negative. In setting of recent NSAIDs, recommend diagnostic EGD. As she has already started empiric antibiotic therapy for H.pylori from Urgent Care, will not stop this and have her complete the course. Pursue US abdomen if EGD negative.   Vaginal bleeding: refer to GYN, as she has not been for 11 years. No concern for rectal bleeding. Heme negative through Urgent Care. Desires screening colonoscopy at time of EGD. No concerning lower GI signs/symptoms. No first degree relatives with colon cancer and no family history of  polyps.    PLAN:  Proceed with TCS with Dr. Gala Romney in near future for routine screening purposes and EGD for diagnostic purposes due to epigastric pain: the risks, benefits, and alternatives have been discussed with the patient in detail. The patient states understanding and desires to proceed.  Continue PPI therapy  Referral to GYN due to abnormal vaginal bleeding  Continue to avoid NSAIDs.   US abdomen if EGD negative  Further recommendations to follow.   Annitta Needs, PhD, ANP-BC [facility]

## 2019-11-26 NOTE — Progress Notes (Signed)
Primary Care Physician:  [name], FNP  Referring Physician: Stevan Born, FNP Primary Gastroenterologist:  Dr. Gala Romney   Chief Complaint  Patient presents with  . Rectal Bleeding    not sure if blood was coming from rectum    HPI:   Sabrina [name] is a 49 y.o. female presenting today at the request of [facility] due to reported rectal bleeding and history of H.pylori in the past.    Hgb 13.2 on outside labs dated 2/20. Iron 78. No ferritin. Heme negative X 1.   Onset of upper abdominal pain one week ago, radiating down left side and then over to the right side with pressure.  Very nauseated at that time. Started seeing blood on the 15th. Started off as lighter blood. When wiping for BM saw no rectal bleeding. Only with wiping to urinate she saw it. Yesterday was last episode of bleeding. Covered the pad for about 5 days.  Had finished her cycle on 2/8th. Years ago had more spread out periods (up to 2 months). For last 10 years has been consistent. Pain is improved. Sunday had one slice of pizza and Pepsi then got sick and weak feeling in upper abdomen. On Monday was afraid to eat. Eating pickles calmed her stomach down. 2 jars of pickles this week. Has been taking omeprazole but wasn't helping. Tuesday had Mylanta, which didn't do much either. Thursday morning tried pepto tablets X 1. Friday saw the black stool after taking pepto. BMs are now brown. Earlier during the week was pale yellow. Has been taking Advil for aches and pains, back pain. Hasn't had any further Advil for the past week. Was taking Advil at night.   Hasn't seen GYN in 11 years.   Now with upper abdominal discomfort that radiates down left side. Did blood test last year for H.pylori after having epigastric pain. Treated for H.pylori and pain improved. Omeprazole daily prior to this. No typical GERD symptoms. No prior EGD. No prior colonoscopy. Maternal grandfather: colon cancer. No family  history of polyps.   She was prescribed another course of antibiotics empirically per Urgent Care for presumed H.pylori infection.   Past Medical History:  Diagnosis Date  . Hypertension     Past Surgical History:  Procedure Laterality Date  . CESAREAN SECTION    . ECTOPIC PREGNANCY SURGERY      Current Outpatient Medications  Medication Sig Dispense Refill  . amLODipine (NORVASC) 5 MG tablet Take 1 tablet (5 mg total) by mouth daily. 90 tablet 4  . amoxicillin (AMOXIL) 500 MG capsule Take 1,000 mg by mouth 2 (two) times daily.     . clarithromycin (BIAXIN) 500 MG tablet Take 500 mg by mouth 2 (two) times daily.    . hydrochlorothiazide (HYDRODIURIL) 12.5 MG tablet Take 1 tablet (12.5 mg total) by mouth daily. (Patient taking differently: Take 12.5 mg by mouth daily as needed (fluid). ) 90 tablet 3  . pantoprazole (PROTONIX) 20 MG tablet Take 20 mg by mouth 2 (two) times daily. For 14 days    . Probiotic Product (PROBIOTIC DAILY PO) Take 1 tablet by mouth daily. 5 billion    . Vitamin D, Ergocalciferol, (DRISDOL) 1.25 MG (50000 UT) CAPS capsule Take 1 capsule (50,000 Units total) by mouth once a week. 12 capsule 3  . fluconazole (DIFLUCAN) 150 MG tablet Take 150 mg by mouth daily as needed (yeast).    . Na Sulfate-K Sulfate-Mg Sulf (SUPREP BOWEL PREP KIT)  17.5-3.13-1.6 GM/177ML SOLN Take 1 kit by mouth as directed. 354 mL 0  . omeprazole (PRILOSEC) 20 MG capsule Take 20 mg by mouth daily.    . ondansetron (ZOFRAN-ODT) 4 MG disintegrating tablet Take 4 mg by mouth every 8 (eight) hours as needed.     No current facility-administered medications for this visit.    Allergies as of 11/26/2019  . (No Known Allergies)    Family History  Problem Relation Age of Onset  . Diabetes Mother   . Hypertension Mother   . Pulmonary embolism Sister   . Hypertension Other   . Diabetes Other     Social History   Socioeconomic History  . Marital status: Married    Spouse name: Not on  file  . Number of children: Not on file  . Years of education: Not on file  . Highest education level: Not on file  Occupational History  . Not on file  Tobacco Use  . Smoking status: Never Smoker  . Smokeless tobacco: Never Used  Substance and Sexual Activity  . Alcohol use: No  . Drug use: No  . Sexual activity: Yes    Birth control/protection: Surgical  Other Topics Concern  . Not on file  Social History Narrative  . Not on file   Social Determinants of Health   Financial Resource Strain:   . Difficulty of Paying Living Expenses: Not on file  Food Insecurity:   . Worried About Running Out of Food in the Last Year: Not on file  . Ran Out of Food in the Last Year: Not on file  Transportation Needs:   . Lack of Transportation (Medical): Not on file  . Lack of Transportation (Non-Medical): Not on file  Physical Activity:   . Days of Exercise per Week: Not on file  . Minutes of Exercise per Session: Not on file  Stress:   . Feeling of Stress : Not on file  Social Connections:   . Frequency of Communication with Friends and Family: Not on file  . Frequency of Social Gatherings with Friends and Family: Not on file  . Attends Religious Services: Not on file  . Active Member of Clubs or Organizations: Not on file  . Attends Club or Organization Meetings: Not on file  . Marital Status: Not on file  Intimate Partner Violence:   . Fear of Current or Ex-Partner: Not on file  . Emotionally Abused: Not on file  . Physically Abused: Not on file  . Sexually Abused: Not on file    Review of Systems: Gen: Denies any fever, chills, fatigue, weight loss, lack of appetite.  CV: Denies chest pain, heart palpitations, peripheral edema, syncope.  Resp: Denies shortness of breath at rest or with exertion. Denies wheezing or cough.  GI: see HPI GU : Denies urinary burning, urinary frequency, urinary hesitancy MS: Denies joint pain, muscle weakness, cramps, or limitation of movement.    Derm: Denies rash, itching, dry skin Psych: Denies depression, anxiety, memory loss, and confusion Heme: see HPI  Physical Exam: BP 140/87   Pulse 78   Temp (!) 97.1 F (36.2 C) (Temporal)   Ht 5' 5" (1.651 m)   Wt 220 lb (99.8 kg)   LMP 11/11/2019 (Approximate)   BMI 36.61 kg/m  General:   Alert and oriented. Pleasant and cooperative. Well-nourished and well-developed.  Head:  Normocephalic and atraumatic. Eyes:  Without icterus, sclera clear and conjunctiva pink.  Ears:  Normal auditory acuity. Lungs:  Clear to   auscultation bilaterally. No wheezes, rales, or rhonchi. No distress.  Heart:  S1, S2 present without murmurs appreciated.  Abdomen:  +BS, soft, mild TTP upper abdomen and non-distended. No HSM noted. No guarding or rebound. No masses appreciated.  Rectal:  Deferred  Msk:  Symmetrical without gross deformities. Normal posture. Extremities:  Without edema. Neurologic:  Alert and  oriented x4;  grossly normal neurologically. Psych:  Alert and cooperative. Normal mood and affect.    ASSESSMENT: Naysa Puskas is a 49 y.o. female presenting today with epigastric pain, nausea, previously taking Advil routinely, reports of vaginal bleeding, history of +H.pylori serology several years ago s/p treatment and now started empirically on repeat regimen for H.pylori by Urgent Care.   Epigastric pain: doubt biliary but unable to exclude. Hgb normal. No melena. Heme negative. In setting of recent NSAIDs, recommend diagnostic EGD. As she has already started empiric antibiotic therapy for H.pylori from Urgent Care, will not stop this and have her complete the course. Pursue US abdomen if EGD negative.   Vaginal bleeding: refer to GYN, as she has not been for 11 years. No concern for rectal bleeding. Heme negative through Urgent Care. Desires screening colonoscopy at time of EGD. No concerning lower GI signs/symptoms. No first degree relatives with colon cancer and no family history of  polyps.    PLAN:  Proceed with TCS with Dr. Gala Romney in near future for routine screening purposes and EGD for diagnostic purposes due to epigastric pain: the risks, benefits, and alternatives have been discussed with the patient in detail. The patient states understanding and desires to proceed.  Continue PPI therapy  Referral to GYN due to abnormal vaginal bleeding  Continue to avoid NSAIDs.   US abdomen if EGD negative  Further recommendations to follow.   Annitta Needs, PhD, ANP-BC [facility]

## 2019-11-26 NOTE — Patient Instructions (Addendum)
We have scheduled a colonoscopy for screening purposes and an endoscopy for diagnostic purposes (due to abdominal pain).  Continue your current regimen of medication, making sure to take Protonix twice a day, 30 minutes before breakfast and dinner, as it is best absorbed this way.  We are referring you to GYN for evaluation and establish care.  Please hold iron for 7 days prior to procedure.  It was a pleasure to see you today. I want to create trusting relationships with patients to provide genuine, compassionate, and quality care. I value your feedback. If you receive a survey regarding your visit,  I greatly appreciate you taking time to fill this out.   Annitta Needs, PhD, ANP-BC [facility]

## 2019-11-27 ENCOUNTER — Other Ambulatory Visit: Payer: Self-pay

## 2019-11-27 ENCOUNTER — Telehealth: Payer: Self-pay

## 2019-11-27 ENCOUNTER — Telehealth: Payer: Self-pay | Admitting: Internal Medicine

## 2019-11-27 DIAGNOSIS — N939 Abnormal uterine and vaginal bleeding, unspecified: Secondary | ICD-10-CM

## 2019-11-27 NOTE — Telephone Encounter (Signed)
Miralax prep instructions placed at front desk for her to pick up. Pt aware.

## 2019-11-27 NOTE — Telephone Encounter (Signed)
Pt called with c/o her procedure prep being over $90.00 and she would like something less expensive.

## 2019-11-27 NOTE — Progress Notes (Signed)
amb re 

## 2019-11-27 NOTE — Telephone Encounter (Signed)
Spoke with pt. Antibiotics and dosage and medication name were updated in pts chart.

## 2019-11-27 NOTE — Telephone Encounter (Signed)
[phone]  Please call patient about her medications.  She was to call here today and clarify her med list

## 2019-11-29 ENCOUNTER — Telehealth: Payer: Self-pay

## 2019-11-29 NOTE — Telephone Encounter (Signed)
Can be taken with or without food, so it will be ok. Biggest thing would be stomach upset. If she completes a 10 day course of this, I recommend stopping. No need for 14 days. We did not prescribe this, but I did not want to stop abruptly since she had already started the antibiotics empirically from PCP.

## 2019-11-29 NOTE — Telephone Encounter (Signed)
Pt called office, she decided to use Suprep for TCS. She is currently taking Amoxicillin and Clarithromycin. Takes antibiotics with food. She wants to know if it is ok to take antibiotics while she is doing clear liquid diet before TCS since she usually takes them with food?  Routing to AB for advice.

## 2019-11-29 NOTE — Telephone Encounter (Signed)
Called and informed pt.  

## 2019-12-02 ENCOUNTER — Other Ambulatory Visit (HOSPITAL_COMMUNITY)
Admission: RE | Admit: 2019-12-02 | Discharge: 2019-12-02 | Disposition: A | Discharge status: Home / Self Care | Payer: 59 | Source: Ambulatory Visit | Attending: Internal Medicine | Admitting: Internal Medicine

## 2019-12-02 ENCOUNTER — Other Ambulatory Visit: Payer: Self-pay

## 2019-12-02 DIAGNOSIS — Z01812 Encounter for preprocedural laboratory examination: Secondary | ICD-10-CM | POA: Insufficient documentation

## 2019-12-02 DIAGNOSIS — Z20822 Contact with and (suspected) exposure to covid-19: Secondary | ICD-10-CM | POA: Insufficient documentation

## 2019-12-02 LAB — SARS CORONAVIRUS 2 (TAT 6-24 HRS): SARS Coronavirus 2: NEGATIVE

## 2019-12-02 NOTE — Progress Notes (Signed)
CC'ED TO PCP 

## 2019-12-03 ENCOUNTER — Telehealth: Payer: Self-pay

## 2019-12-03 NOTE — Telephone Encounter (Signed)
Sabrina [name] calling from [facility]. OV notes from 11/26/2019. Pt was referred by [facility].Fax [phone], Phone [phone]

## 2019-12-04 ENCOUNTER — Encounter (HOSPITAL_COMMUNITY)
Admission: RE | Disposition: A | Discharge status: Home / Self Care | Payer: Self-pay | Source: Home / Self Care | Attending: Internal Medicine

## 2019-12-04 ENCOUNTER — Ambulatory Visit (HOSPITAL_COMMUNITY)
Admission: RE | Admit: 2019-12-04 | Discharge: 2019-12-04 | Disposition: A | Discharge status: Home / Self Care | Payer: 59 | Attending: Internal Medicine | Admitting: Internal Medicine

## 2019-12-04 ENCOUNTER — Encounter (HOSPITAL_COMMUNITY): Payer: Self-pay | Admitting: Internal Medicine

## 2019-12-04 ENCOUNTER — Telehealth: Payer: Self-pay | Admitting: Internal Medicine

## 2019-12-04 ENCOUNTER — Other Ambulatory Visit: Payer: Self-pay

## 2019-12-04 DIAGNOSIS — N939 Abnormal uterine and vaginal bleeding, unspecified: Secondary | ICD-10-CM | POA: Diagnosis not present

## 2019-12-04 DIAGNOSIS — Z79899 Other long term (current) drug therapy: Secondary | ICD-10-CM | POA: Insufficient documentation

## 2019-12-04 DIAGNOSIS — Z800 Family history of malignant neoplasm of digestive organs: Secondary | ICD-10-CM | POA: Diagnosis not present

## 2019-12-04 DIAGNOSIS — I10 Essential (primary) hypertension: Secondary | ICD-10-CM | POA: Diagnosis not present

## 2019-12-04 DIAGNOSIS — R1013 Epigastric pain: Secondary | ICD-10-CM | POA: Diagnosis not present

## 2019-12-04 DIAGNOSIS — Z1211 Encounter for screening for malignant neoplasm of colon: Secondary | ICD-10-CM | POA: Diagnosis not present

## 2019-12-04 DIAGNOSIS — R1011 Right upper quadrant pain: Secondary | ICD-10-CM

## 2019-12-04 DIAGNOSIS — Z8619 Personal history of other infectious and parasitic diseases: Secondary | ICD-10-CM | POA: Diagnosis not present

## 2019-12-04 DIAGNOSIS — K5730 Diverticulosis of large intestine without perforation or abscess without bleeding: Secondary | ICD-10-CM | POA: Diagnosis not present

## 2019-12-04 DIAGNOSIS — K625 Hemorrhage of anus and rectum: Secondary | ICD-10-CM | POA: Insufficient documentation

## 2019-12-04 HISTORY — PX: COLONOSCOPY: SHX5424

## 2019-12-04 HISTORY — PX: ESOPHAGOGASTRODUODENOSCOPY: SHX5428

## 2019-12-04 SURGERY — COLONOSCOPY
Anesthesia: Moderate Sedation

## 2019-12-04 MED ORDER — LIDOCAINE VISCOUS HCL 2 % MT SOLN
OROMUCOSAL | Status: DC | PRN
  Administered 2019-12-04: 1 via OROMUCOSAL

## 2019-12-04 MED ORDER — MEPERIDINE HCL 50 MG/ML IJ SOLN
INTRAMUSCULAR | Status: AC
  Filled 2019-12-04: qty 1

## 2019-12-04 MED ORDER — MIDAZOLAM HCL 5 MG/5ML IJ SOLN
INTRAMUSCULAR | Status: DC | PRN
  Administered 2019-12-04: 1 mg via INTRAVENOUS
  Administered 2019-12-04: 2 mg via INTRAVENOUS
  Administered 2019-12-04: 1 mg via INTRAVENOUS
  Administered 2019-12-04: 2 mg via INTRAVENOUS
  Administered 2019-12-04: 1 mg via INTRAVENOUS

## 2019-12-04 MED ORDER — ONDANSETRON HCL 4 MG/2ML IJ SOLN
INTRAMUSCULAR | Status: AC
  Filled 2019-12-04: qty 2

## 2019-12-04 MED ORDER — MEPERIDINE HCL 100 MG/ML IJ SOLN
INTRAMUSCULAR | Status: DC | PRN
  Administered 2019-12-04: 10 mg
  Administered 2019-12-04: 25 mg
  Administered 2019-12-04: 15 mg

## 2019-12-04 MED ORDER — LIDOCAINE VISCOUS HCL 2 % MT SOLN
OROMUCOSAL | Status: AC
  Filled 2019-12-04: qty 15

## 2019-12-04 MED ORDER — ONDANSETRON HCL 4 MG/2ML IJ SOLN
INTRAMUSCULAR | Status: DC | PRN
  Administered 2019-12-04: 4 mg via INTRAVENOUS

## 2019-12-04 MED ORDER — MIDAZOLAM HCL 5 MG/5ML IJ SOLN
INTRAMUSCULAR | Status: AC
  Filled 2019-12-04: qty 10

## 2019-12-04 MED ORDER — SODIUM CHLORIDE 0.9 % IV SOLN: INTRAVENOUS | Status: DC

## 2019-12-04 NOTE — Interval H&P Note (Signed)
History and Physical Interval Note:  12/04/2019 1:41 PM  Sabrina [name]  has presented today for surgery, with the diagnosis of screening colonoscopy, dyspepsia.  The various methods of treatment have been discussed with the patient and family. After consideration of risks, benefits and other options for treatment, the patient has consented to  Procedure(s) with comments: COLONOSCOPY (N/A) - 2:00pm ESOPHAGOGASTRODUODENOSCOPY (EGD) (N/A) as a surgical intervention.  The patient's history has been reviewed, patient examined, no change in status, stable for surgery.  I have reviewed the patient's chart and labs.  Questions were answered to the patient's satisfaction.     Sabrina [name]   No dysphagia.  No change.  Diagnostic EGD and screening colonoscopy today per plan.   The risks, benefits, limitations, imponderables and alternatives regarding both EGD and colonoscopy have been reviewed with the patient. Questions have been answered. All parties agreeable.

## 2019-12-04 NOTE — Discharge Instructions (Signed)
EGD Discharge instructions Please read the instructions outlined below and refer to this sheet in the next few weeks. These discharge instructions provide you with general information on caring for yourself after you leave the hospital. Your doctor may also give you specific instructions. While your treatment has been planned according to the most current medical practices available, unavoidable complications occasionally occur. If you have any problems or questions after discharge, please call your doctor. ACTIVITY  You may resume your regular activity but move at a slower pace for the next 24 hours.   Take frequent rest periods for the next 24 hours.   Walking will help expel (get rid of) the air and reduce the bloated feeling in your abdomen.   No driving for 24 hours (because of the anesthesia (medicine) used during the test).   You may shower.   Do not sign any important legal documents or operate any machinery for 24 hours (because of the anesthesia used during the test).  NUTRITION  Drink plenty of fluids.   You may resume your normal diet.   Begin with a light meal and progress to your normal diet.   Avoid alcoholic beverages for 24 hours or as instructed by your caregiver.  MEDICATIONS  You may resume your normal medications unless your caregiver tells you otherwise.  WHAT YOU CAN EXPECT TODAY  You may experience abdominal discomfort such as a feeling of fullness or "gas" pains.  FOLLOW-UP  Your doctor will discuss the results of your test with you.  SEEK IMMEDIATE MEDICAL ATTENTION IF ANY OF THE FOLLOWING OCCUR:  Excessive nausea (feeling sick to your stomach) and/or vomiting.   Severe abdominal pain and distention (swelling).   Trouble swallowing.   Temperature over 101 F (37.8 C).   Rectal bleeding or vomiting of blood.    Colonoscopy Discharge Instructions  Read the instructions outlined below and refer to this sheet in the next few weeks. These  discharge instructions provide you with general information on caring for yourself after you leave the hospital. Your doctor may also give you specific instructions. While your treatment has been planned according to the most current medical practices available, unavoidable complications occasionally occur. If you have any problems or questions after discharge, call Dr. Gala Romney at [phone]. ACTIVITY  You may resume your regular activity, but move at a slower pace for the next 24 hours.   Take frequent rest periods for the next 24 hours.   Walking will help get rid of the air and reduce the bloated feeling in your belly (abdomen).   No driving for 24 hours (because of the medicine (anesthesia) used during the test).    Do not sign any important legal documents or operate any machinery for 24 hours (because of the anesthesia used during the test).  NUTRITION  Drink plenty of fluids.   You may resume your normal diet as instructed by your doctor.   Begin with a light meal and progress to your normal diet. Heavy or fried foods are harder to digest and may make you feel sick to your stomach (nauseated).   Avoid alcoholic beverages for 24 hours or as instructed.  MEDICATIONS  You may resume your normal medications unless your doctor tells you otherwise.  WHAT YOU CAN EXPECT TODAY  Some feelings of bloating in the abdomen.   Passage of more gas than usual.   Spotting of blood in your stool or on the toilet paper.  IF YOU HAD POLYPS REMOVED DURING THE  COLONOSCOPY:  No aspirin products for 7 days or as instructed.   No alcohol for 7 days or as instructed.   Eat a soft diet for the next 24 hours.  FINDING OUT THE RESULTS OF YOUR TEST Not all test results are available during your visit. If your test results are not back during the visit, make an appointment with your caregiver to find out the results. Do not assume everything is normal if you have not heard from your caregiver or the  medical facility. It is important for you to follow up on all of your test results.  SEEK IMMEDIATE MEDICAL ATTENTION IF:  You have more than a spotting of blood in your stool.   Your belly is swollen (abdominal distention).   You are nauseated or vomiting.   You have a temperature over 101.   You have abdominal pain or discomfort that is severe or gets worse throughout the day.    Your upper endoscopy was normal  Your colonoscopy looked good except for her diverticulosis (see accompanying information)  Recommend you have a repeat colonoscopy in 10 years for cancer screening  Please complete treatment for H. Pylori taking all the prescribed medication without lapse  Gallbladder ultrasound in the near future to further evaluate upper abdominal discomfort  Office visit with [place] in 6 weeks  At patient request I called Harrison Mons at [phone] and reviewed results.

## 2019-12-04 NOTE — Op Note (Signed)
[facility] Patient Name: Sabrina [name] Procedure Date: 12/04/2019 1:45 PM MRN: [id number] Date of Birth: [birth year] Attending MD: Norvel Richards , MD CSN: [id number] Age: 49 Admit Type: Outpatient Procedure:                Upper GI endoscopy Indications:              Dyspepsia Providers:                Norvel Richards, MD, Janeece Riggers, RN, Nelma Rothman, Technician Referring MD:              Medicines:                Midazolam 5 mg IV, Meperidine 50 mg IV Complications:            No immediate complications. Estimated Blood Loss:     Estimated blood loss: none. Procedure:                Pre-Anesthesia Assessment:                           - Prior to the procedure, a History and Physical                            was performed, and patient medications and                            allergies were reviewed. The patient's tolerance of                            previous anesthesia was also reviewed. The risks                            and benefits of the procedure and the sedation                            options and risks were discussed with the patient.                            All questions were answered, and informed consent                            was obtained. Prior Anticoagulants: The patient has                            taken no previous anticoagulant or antiplatelet                            agents. ASA Grade Assessment: II - A patient with                            mild systemic disease. After reviewing the risks  and benefits, the patient was deemed in                            satisfactory condition to undergo the procedure.                           After obtaining informed consent, the endoscope was                            passed under direct vision. Throughout the                            procedure, the patient's blood pressure, pulse, and                            oxygen saturations  were monitored continuously. The                            [id number]) scope was introduced through the                            mouth, and advanced to the second part of duodenum.                            The upper GI endoscopy was accomplished without                            difficulty. The patient tolerated the procedure                            well. Scope In: 1:55:16 PM Scope Out: 1:59:58 PM Total Procedure Duration: 0 hours 4 minutes 42 seconds  Findings:      The examined esophagus was normal.      The entire examined stomach was normal.      The duodenal bulb and second portion of the duodenum were normal. Impression:               - Normal esophagus.                           - Normal stomach.                           - Normal duodenal bulb and second portion of the                            duodenum.                           - No specimens collected. No explanation for upper                            abdominal discomfort based on today's examination.                            She  is in the process of being treated for H.                            pylori. Moderate Sedation:      Moderate (conscious) sedation was administered by the endoscopy nurse       and supervised by the endoscopist. The following parameters were       monitored: oxygen saturation, heart rate, blood pressure, respiratory       rate, EKG, adequacy of pulmonary ventilation, and response to care.       Total physician intraservice time was 11 minutes. Recommendation:           - Patient has a contact number available for                            emergencies. The signs and symptoms of potential                            delayed complications were discussed with the                            patient. Return to normal activities tomorrow.                            Written discharge instructions were provided to the                            patient.                           -  Resume previous diet.                           - Continue present medications. Complete ongoing                            treatment for H. pylori. Finish all oral medication                            prescribed without lapse.                           - Return to my office after studies are complete.                            See colonoscopy report. We will proceed with a                            gallbladder ultrasound following endoscopic                            evaluation today. Procedure Code(s):        --- Professional ---                           [phone], Esophagogastroduodenoscopy, flexible,  transoral; diagnostic, including collection of                            specimen(s) by brushing or washing, when performed                            (separate procedure)                           G0500, Moderate sedation services provided by the                            same physician or other qualified health care                            professional performing a gastrointestinal                            endoscopic service that sedation supports,                            requiring the presence of an independent trained                            observer to assist in the monitoring of the                            patient's level of consciousness and physiological                            status; initial 15 minutes of intra-service time;                            patient age 82 years or older (additional time may                            be reported with [phone], as appropriate) Diagnosis Code(s):        --- Professional ---                           R10.13, Epigastric pain CPT copyright 2019 American [employer] Association. All rights reserved. The codes documented in this report are preliminary and upon coder review may  be revised to meet current compliance requirements. Cristopher Estimable. Tanishi Nault, MD Norvel Richards, MD 12/04/2019 2:04:48 PM This  report has been signed electronically. Number of Addenda: 0

## 2019-12-04 NOTE — Telephone Encounter (Signed)
Per discharge summery: "Gallbladder ultrasound in the near future to further evaluate upper abdominal discomfort"  RUQ scheduled for 3/10 at 8:30am, arrival 8:15am, npo midnight   Called patient and is aware of appt details.nothing further needed

## 2019-12-04 NOTE — Op Note (Signed)
[facility] Patient Name: Sabrina [name] Procedure Date: 12/04/2019 2:00 PM MRN: [id number] Date of Birth: 05/16/1971 Attending MD: Norvel Richards , MD CSN: [id number] Age: 49 Admit Type: Outpatient Procedure:                Colonoscopy Indications:              Screening for colorectal malignant neoplasm Providers:                Norvel Richards, MD, Janeece Riggers, RN, Nelma Rothman, Technician Referring MD:              Medicines:                Meperidine 50 mg IV, Midazolam 7 mg IV, Ondansetron                            4 mg IV Complications:            No immediate complications. Estimated Blood Loss:     Estimated blood loss: none. Procedure:                Pre-Anesthesia Assessment:                           - Prior to the procedure, a History and Physical                            was performed, and patient medications and                            allergies were reviewed. The patient's tolerance of                            previous anesthesia was also reviewed. The risks                            and benefits of the procedure and the sedation                            options and risks were discussed with the patient.                            All questions were answered, and informed consent                            was obtained. Prior Anticoagulants: The patient has                            taken no previous anticoagulant or antiplatelet                            agents. ASA Grade Assessment: II - A patient with  mild systemic disease. After reviewing the risks                            and benefits, the patient was deemed in                            satisfactory condition to undergo the procedure.                           After obtaining informed consent, the colonoscope                            was passed under direct vision. Throughout the                            procedure, the  patient's blood pressure, pulse, and                            oxygen saturations were monitored continuously. The                            [id number]) scope was introduced through                            the anus and advanced to the the cecum, identified                            by appendiceal orifice and ileocecal valve. The                            colonoscopy was performed without difficulty. The                            patient tolerated the procedure well. The quality                            of the bowel preparation was adequate. Scope In: 2:06:19 PM Scope Out: 2:17:46 PM Scope Withdrawal Time: 0 hours 6 minutes 51 seconds  Total Procedure Duration: 0 hours 11 minutes 27 seconds  Findings:      The perianal and digital rectal examinations were normal.      Scattered medium-mouthed diverticula were found in the sigmoid colon.      The exam was otherwise without abnormality on direct and retroflexion       views. Impression:               - Diverticulosis in the sigmoid colon.                           - The examination was otherwise normal on direct                            and retroflexion views.                           -  No specimens collected. Moderate Sedation:      Moderate (conscious) sedation was administered by the endoscopy nurse       and supervised by the endoscopist. The following parameters were       monitored: oxygen saturation, heart rate, blood pressure, respiratory       rate, EKG, adequacy of pulmonary ventilation, and response to care.       Total physician intraservice time was 23 minutes. Recommendation:           - Patient has a contact number available for                            emergencies. The signs and symptoms of potential                            delayed complications were discussed with the                            patient. Return to normal activities tomorrow.                            Written discharge  instructions were provided to the                            patient.                           - Resume previous diet.                           - Continue present medications.                           - Repeat colonoscopy in 10 years for screening                            purposes.                           - Return to GI office in 6 weeks. See EGD report. Procedure Code(s):        --- Professional ---                           [phone], Colonoscopy, flexible; diagnostic, including                            collection of specimen(s) by brushing or washing,                            when performed (separate procedure)                           99153, Moderate sedation; each additional 15                            minutes intraservice time  G0500, Moderate sedation services provided by the                            same physician or other qualified health care                            professional performing a gastrointestinal                            endoscopic service that sedation supports,                            requiring the presence of an independent trained                            observer to assist in the monitoring of the                            patient's level of consciousness and physiological                            status; initial 15 minutes of intra-service time;                            patient age 29 years or older (additional time may                            be reported with [phone], as appropriate) Diagnosis Code(s):        --- Professional ---                           Z12.11, Encounter for screening for malignant                            neoplasm of colon                           K57.30, Diverticulosis of large intestine without                            perforation or abscess without bleeding CPT copyright 2019 American [employer] Association. All rights reserved. The codes documented in this report are preliminary and  upon coder review may  be revised to meet current compliance requirements. Cristopher Estimable. Siya Flurry, MD Norvel Richards, MD 12/04/2019 2:21:25 PM This report has been signed electronically. Number of Addenda: 0

## 2019-12-04 NOTE — Addendum Note (Signed)
Addended by: Cheron Every on: 12/04/2019 04:22 PM   Modules accepted: Orders

## 2019-12-04 NOTE — Telephone Encounter (Signed)
Short Stay called to make PP FU in 6 weeks and is aware. Also said that patient will need a gall bladder US.

## 2019-12-11 ENCOUNTER — Other Ambulatory Visit: Payer: Self-pay

## 2019-12-11 ENCOUNTER — Ambulatory Visit (HOSPITAL_COMMUNITY)
Admission: RE | Admit: 2019-12-11 | Discharge: 2019-12-11 | Disposition: A | Discharge status: Home / Self Care | Payer: 59 | Source: Ambulatory Visit | Attending: Internal Medicine | Admitting: Internal Medicine

## 2019-12-11 DIAGNOSIS — R1011 Right upper quadrant pain: Secondary | ICD-10-CM | POA: Insufficient documentation

## 2019-12-11 NOTE — Telephone Encounter (Signed)
Pt seen and had procedure on 12/04/2019 and has FU in April

## 2019-12-13 ENCOUNTER — Other Ambulatory Visit: Payer: Self-pay

## 2019-12-13 DIAGNOSIS — K8020 Calculus of gallbladder without cholecystitis without obstruction: Secondary | ICD-10-CM

## 2019-12-13 DIAGNOSIS — Z79899 Other long term (current) drug therapy: Secondary | ICD-10-CM

## 2019-12-16 ENCOUNTER — Other Ambulatory Visit: Payer: Self-pay

## 2019-12-16 ENCOUNTER — Telehealth: Payer: Self-pay | Admitting: Internal Medicine

## 2019-12-16 DIAGNOSIS — Z79899 Other long term (current) drug therapy: Secondary | ICD-10-CM

## 2019-12-16 DIAGNOSIS — K8020 Calculus of gallbladder without cholecystitis without obstruction: Secondary | ICD-10-CM

## 2019-12-16 NOTE — Telephone Encounter (Signed)
[phone] patient went to the lab this morning and they did not have the order, can you please call her when you send it to the lab so she can make another appointment .

## 2019-12-16 NOTE — Telephone Encounter (Signed)
Spoke with pt. Lab orders are in [facility]. Pt will have labs done.

## 2019-12-17 ENCOUNTER — Ambulatory Visit (INDEPENDENT_AMBULATORY_CARE_PROVIDER_SITE_OTHER): Payer: 59 | Admitting: General Surgery

## 2019-12-17 ENCOUNTER — Encounter: Payer: 59 | Admitting: Obstetrics & Gynecology

## 2019-12-17 ENCOUNTER — Other Ambulatory Visit: Payer: Self-pay

## 2019-12-17 ENCOUNTER — Encounter: Payer: Self-pay | Admitting: General Surgery

## 2019-12-17 VITALS — BP 129/84 | HR 82 | Temp 97.4°F | Resp 14 | Ht 65.0 in | Wt 217.0 lb

## 2019-12-17 DIAGNOSIS — K8020 Calculus of gallbladder without cholecystitis without obstruction: Secondary | ICD-10-CM | POA: Diagnosis not present

## 2019-12-17 NOTE — Patient Instructions (Signed)
Cholelithiasis  Cholelithiasis is a form of gallbladder disease in which gallstones form in the gallbladder. The gallbladder is an organ that stores bile. Bile is made in the liver, and it helps to digest fats. Gallstones begin as small crystals and slowly grow into stones. They may cause no symptoms until the gallbladder tightens (contracts) and a gallstone is blocking the duct (gallbladder attack), which can cause pain. Cholelithiasis is also referred to as gallstones. There are two main types of gallstones:  Cholesterol stones. These are made of hardened cholesterol and are usually yellow-green in color. They are the most common type of gallstone. Cholesterol is a white, waxy, fat-like substance that is made in the liver.  Pigment stones. These are dark in color and are made of a red-yellow substance that forms when hemoglobin from red blood cells breaks down (bilirubin). What are the causes? This condition may be caused by an imbalance in the substances that bile is made of. This can happen if the bile:  Has too much bilirubin.  Has too much cholesterol.  Does not have enough bile salts. These salts help the body absorb and digest fats. In some cases, this condition can also be caused by the gallbladder not emptying completely or often enough. What increases the risk? The following factors may make you more likely to develop this condition:  Being female.  Having multiple pregnancies. Health care providers sometimes advise removing diseased gallbladders before future pregnancies.  Eating a diet that is heavy in fried foods, fat, and refined carbohydrates, like white bread and white rice.  Being obese.  Being older than age 40.  Prolonged use of medicines that contain female hormones (estrogen).  Having diabetes mellitus.  Rapidly losing weight.  Having a family history of gallstones.  Being of American Indian or [place] descent.  Having an intestinal disease such as Crohn  disease.  Having metabolic syndrome.  Having cirrhosis.  Having severe types of anemia such as sickle cell anemia. What are the signs or symptoms? In most cases, there are no symptoms. These are known as silent gallstones. If a gallstone blocks the bile ducts, it can cause a gallbladder attack. The main symptom of a gallbladder attack is sudden pain in the upper right abdomen. The pain usually comes at night or after eating a large meal. The pain can last for one or several hours and can spread to the right shoulder or chest. If the bile duct is blocked for more than a few hours, it can cause infection or inflammation of the gallbladder, liver, or pancreas, which may cause:  Nausea.  Vomiting.  Abdominal pain that lasts for 5 hours or more.  Fever or chills.  Yellowing of the skin or the whites of the eyes (jaundice).  Dark urine.  Light-colored stools. How is this diagnosed? This condition may be diagnosed based on:  A physical exam.  Your medical history.  An ultrasound of your gallbladder.  CT scan.  MRI.  Blood tests to check for signs of infection or inflammation.  A scan of your gallbladder and bile ducts (biliary system) using nonharmful radioactive material and special cameras that can see the radioactive material (cholescintigram). This test checks to see how your gallbladder contracts and whether bile ducts are blocked.  Inserting a small tube with a camera on the end (endoscope) through your mouth to inspect bile ducts and check for blockages (endoscopic retrograde cholangiopancreatogram). How is this treated? Treatment for gallstones depends on the severity of the condition.   Silent gallstones do not need treatment. If the gallstones cause a gallbladder attack or other symptoms, treatment may be required. Options for treatment include:  Surgery to remove the gallbladder (cholecystectomy). This is the most common treatment.  Medicines to dissolve gallstones.  These are most effective at treating small gallstones. You may need to take medicines for up to 6-12 months.  Shock wave treatment (extracorporeal biliary lithotripsy). In this treatment, an ultrasound machine sends shock waves to the gallbladder to break gallstones into smaller pieces. These pieces can then be passed into the intestines or be dissolved by medicine. This is rarely used.  Removing gallstones through endoscopic retrograde cholangiopancreatogram. A small basket can be attached to the endoscope and used to capture and remove gallstones. Follow these instructions at home:  Take over-the-counter and prescription medicines only as told by your health care provider.  Maintain a healthy weight and follow a healthy diet. This includes: ? Reducing fatty foods, such as fried food. ? Reducing refined carbohydrates, like white bread and white rice. ? Increasing fiber. Aim for foods like almonds, fruit, and beans.  Keep all follow-up visits as told by your health care provider. This is important. Contact a health care provider if:  You think you have had a gallbladder attack.  You have been diagnosed with silent gallstones and you develop abdominal pain or indigestion. Get help right away if:  You have pain from a gallbladder attack that lasts for more than 2 hours.  You have abdominal pain that lasts for more than 5 hours.  You have a fever or chills.  You have persistent nausea and vomiting.  You develop jaundice.  You have dark urine or light-colored stools. Summary  Cholelithiasis (also called gallstones) is a form of gallbladder disease in which gallstones form in the gallbladder.  This condition is caused by an imbalance in the substances that make up bile. This can happen if the bile has too much cholesterol, too much bilirubin, or not enough bile salts.  You are more likely to develop this condition if you are female, pregnant, using medicines with estrogen, obese,  older than age 40, or have a family history of gallstones. You may also develop gallstones if you have diabetes, an intestinal disease, cirrhosis, or metabolic syndrome.  Treatment for gallstones depends on the severity of the condition. Silent gallstones do not need treatment.  If gallstones cause a gallbladder attack or other symptoms, treatment may be needed. The most common treatment is surgery to remove the gallbladder. This information is not intended to replace advice given to you by your health care provider. Make sure you discuss any questions you have with your health care provider. Document Revised: 09/01/2017 Document Reviewed: 06/05/2016 Elsevier Patient Education  2020 [employer]   Laparoscopic Cholecystectomy Laparoscopic cholecystectomy is surgery to remove the gallbladder. The gallbladder is a pear-shaped organ that lies beneath the liver on the right side of the body. The gallbladder stores bile, which is a fluid that helps the body to digest fats. Cholecystectomy is often done for inflammation of the gallbladder (cholecystitis). This condition is usually caused by a buildup of gallstones (cholelithiasis) in the gallbladder. Gallstones can block the flow of bile, which can result in inflammation and pain. In severe cases, emergency surgery may be required. This procedure is done though small incisions in your abdomen (laparoscopic surgery). A thin scope with a camera (laparoscope) is inserted through one incision. Thin surgical instruments are inserted through the other incisions. In some cases,   a laparoscopic procedure may be turned into a type of surgery that is done through a larger incision (open surgery). Tell a health care provider about:  Any allergies you have.  All medicines you are taking, including vitamins, herbs, eye drops, creams, and over-the-counter medicines.  Any problems you or family members have had with anesthetic medicines.  Any blood disorders you  have.  Any surgeries you have had.  Any medical conditions you have.  Whether you are pregnant or may be pregnant. What are the risks? Generally, this is a safe procedure. However, problems may occur, including:  Infection.  Bleeding.  Allergic reactions to medicines.  Damage to other structures or organs.  A stone remaining in the common bile duct. The common bile duct carries bile from the gallbladder into the small intestine.  A bile leak from the cyst duct that is clipped when your gallbladder is removed. Medicines  Ask your health care provider about: ? Changing or stopping your regular medicines. This is especially important if you are taking diabetes medicines or blood thinners. ? Taking medicines such as aspirin and ibuprofen. These medicines can thin your blood. Do not take these medicines before your procedure if your health care provider instructs you not to.  You may be given antibiotic medicine to help prevent infection. General instructions  Let your health care provider know if you develop a cold or an infection before surgery.  Plan to have someone take you home from the hospital or clinic.  Ask your health care provider how your surgical site will be marked or identified. What happens during the procedure?   To reduce your risk of infection: ? Your health care team will wash or sanitize their hands. ? Your skin will be washed with soap. ? Hair may be removed from the surgical area.  An IV tube may be inserted into one of your veins.  You will be given one or more of the following: ? A medicine to help you relax (sedative). ? A medicine to make you fall asleep (general anesthetic).  A breathing tube will be placed in your mouth.  Your surgeon will make several small cuts (incisions) in your abdomen.  The laparoscope will be inserted through one of the small incisions. The camera on the laparoscope will send images to a TV screen (monitor) in the  operating room. This lets your surgeon see inside your abdomen.  Air-like gas will be pumped into your abdomen. This will expand your abdomen to give the surgeon more room to perform the surgery.  Other tools that are needed for the procedure will be inserted through the other incisions. The gallbladder will be removed through one of the incisions.  Your common bile duct may be examined. If stones are found in the common bile duct, they may be removed.  After your gallbladder has been removed, the incisions will be closed with stitches (sutures), staples, or skin glue.  Your incisions may be covered with a bandage (dressing). The procedure may vary among health care providers and hospitals. What happens after the procedure?  Your blood pressure, heart rate, breathing rate, and blood oxygen level will be monitored until the medicines you were given have worn off.  You will be given medicines as needed to control your pain.  Do not drive for 24 hours if you were given a sedative. This information is not intended to replace advice given to you by your health care provider. Make sure you discuss any questions   you have with your health care provider. Document Revised: 09/01/2017 Document Reviewed: 03/07/2016 Elsevier Patient Education  2020 Elsevier Inc.   

## 2019-12-17 NOTE — Progress Notes (Signed)
[facility] History and Physical  Reason for Referral:  Gallstones Referring Physician: Dr. Gala Romney   Chief Complaint    New Patient (Initial Visit)      Sabrina [name] is a 49 y.o. female.  HPI: Sabrina [name] is a sweet 49 yo who has some chronic epigastric and right sided abdominal pain. She was seen by Urgent care around 2/14, and was prescribed repeat treatment for H pylori as she has had this in the past. She was then referred to Dr. Gala Romney. She describes the pain and stabbing and radiated down her left and right side. She also described some rectal bleeding. She has avoided greasy food for a while so cannot say that this makes the pain worse.  She says that in the past the pain she had improved with her H pylori  Treatment.  She says she has been slightly better since the treatment, but still has some pain mostly in the RUQ.   She denies any vomiting but has had nausea.   EGD/ Colonoscopy - 12/04/2019 With Dr. Gala Romney -No gastritis or ulcer -Diverticulosis but otherwise normal   Past Medical History:  Diagnosis Date  . Hypertension     Past Surgical History:  Procedure Laterality Date  . CESAREAN SECTION    . COLONOSCOPY N/A 12/04/2019   Procedure: COLONOSCOPY;  Surgeon: Daneil Dolin, MD;  Location: [facility];  Service: Endoscopy;  Laterality: N/A;  2:00pm  . ECTOPIC PREGNANCY SURGERY    . ESOPHAGOGASTRODUODENOSCOPY N/A 12/04/2019   Procedure: ESOPHAGOGASTRODUODENOSCOPY (EGD);  Surgeon: Daneil Dolin, MD;  Location: [facility];  Service: Endoscopy;  Laterality: N/A;    Family History  Problem Relation Age of Onset  . Diabetes Mother   . Hypertension Mother   . Pulmonary embolism Sister   . Hypertension Other   . Diabetes Other     Social History   Tobacco Use  . Smoking status: Never Smoker  . Smokeless tobacco: Never Used  Substance Use Topics  . Alcohol use: No  . Drug use: No    Medications: I have reviewed the patient's current  medications. Allergies as of 12/17/2019   No Known Allergies     Medication List       Accurate as of December 17, 2019 11:44 AM. If you have any questions, ask your nurse or doctor.        STOP taking these medications   amoxicillin 500 MG capsule Commonly known as: AMOXIL Stopped by: Virl Cagey, MD   clarithromycin 500 MG tablet Commonly known as: BIAXIN Stopped by: Virl Cagey, MD   fluconazole 150 MG tablet Commonly known as: DIFLUCAN Stopped by: Virl Cagey, MD   pantoprazole 20 MG tablet Commonly known as: PROTONIX Stopped by: Virl Cagey, MD   Suprep Bowel Prep Kit 17.5-3.13-1.6 GM/177ML Soln Generic drug: Na Sulfate-K Sulfate-Mg Sulf Stopped by: Virl Cagey, MD     TAKE these medications   amLODipine 5 MG tablet Commonly known as: NORVASC Take 1 tablet (5 mg total) by mouth daily.   hydrochlorothiazide 12.5 MG tablet Commonly known as: HYDRODIURIL Take 1 tablet (12.5 mg total) by mouth daily. What changed:   when to take this  reasons to take this   omeprazole 20 MG capsule Commonly known as: PRILOSEC Take 20 mg by mouth daily.   ondansetron 4 MG disintegrating tablet Commonly known as: ZOFRAN-ODT Take 4 mg by mouth every 8 (eight) hours as needed.   PROBIOTIC DAILY  PO Take 1 tablet by mouth daily. 5 billion   Vitamin D (Ergocalciferol) 1.25 MG (50000 UNIT) Caps capsule Commonly known as: DRISDOL Take 1 capsule (50,000 Units total) by mouth once a week.        ROS:  A comprehensive review of systems was negative except for: Gastrointestinal: positive for abdominal pain, nausea and reflux symptoms Musculoskeletal: positive for back pain, neck pain and joint pain  Blood pressure 129/84, pulse 82, temperature (!) 97.4 F (36.3 C), temperature source Oral, resp. rate 14, height _0  (1.651 m), weight 217 lb (98.4 kg), last menstrual period 11/22/2019, SpO2 98 %. Physical Exam Vitals reviewed.  Constitutional:        Appearance: Normal appearance.  HENT:     Head: Normocephalic and atraumatic.     Nose: Nose normal.  Eyes:     Extraocular Movements: Extraocular movements intact.     Pupils: Pupils are equal, round, and reactive to light.  Cardiovascular:     Rate and Rhythm: Normal rate and regular rhythm.  Pulmonary:     Effort: Pulmonary effort is normal.     Breath sounds: Normal breath sounds.  Abdominal:     General: There is no distension.     Palpations: Abdomen is soft.     Tenderness: There is no abdominal tenderness.  Musculoskeletal:        General: No swelling. Normal range of motion.     Cervical back: Normal range of motion.  Skin:    General: Skin is warm and dry.  Neurological:     General: No focal deficit present.     Mental Status: She is alert and oriented to person, place, and time.  Psychiatric:        Mood and Affect: Mood normal.        Behavior: Behavior normal.        Thought Content: Thought content normal.        Judgment: Judgment normal.     Results: RUQ [place] 3/10  CLINICAL DATA:  Right upper quadrant pain.  Pain for several weeks.  EXAM: ULTRASOUND ABDOMEN LIMITED RIGHT UPPER QUADRANT  COMPARISON:  None.  FINDINGS: Gallbladder:  Filled with stones and sludge. Posterior shadowing obscures evaluation of the posterior wall. No gallbladder wall thickening, normal wall thickness of 2 mm. No pericholecystic fluid. No sonographic Murphy sign noted by sonographer.  Common bile duct:  Diameter: Dilated measuring 9 mm in the midportion. No visualized choledocholithiasis.  Liver:  No focal lesion identified. Heterogeneously increased in parenchymal echogenicity. Portal vein is patent on color Doppler imaging with normal direction of blood flow towards the liver.  Other: None.  IMPRESSION: 1. Gallbladder filled with stones and sludge. No sonographic findings of acute cholecystitis. 2. Common bile duct dilatation of 9 mm. No  visualized choledocholithiasis. Consider further evaluation with MRCP for more detailed assessment of the biliary tree. 3. Hepatic steatosis.   Electronically Signed   By: Keith Rake M.D.   On: 12/11/2019 13:22   Assessment & Plan:  Sabrina [name] is a 49 y.o. female with gallstones on [place] and some epigastric/ RUQ pain that continues despite repeat treatment for H pylori. We discussed that some of the pain could be from her gallbladder. We discussed her dilated CBD and possibility of passing stones. We discussed that no stone is seen in her duct at this time.   -Laparoscopic cholecystectomy with intraoperative cholangiogram  PLAN: I counseled the patient about the indication, risks and benefits of laparoscopic  cholecystectomy and IOC.  She understands there is a very small chance for bleeding, infection, injury to normal structures (including common bile duct), conversion to open surgery, use of dye and allergies related to the dye, persistent symptoms, evolution of postcholecystectomy diarrhea, need for secondary interventions, anesthesia reaction, cardiopulmonary issues and other risks not specifically detailed here. I described the expected recovery, the plan for follow-up and the restrictions during the recovery phase. All questions were answered.  -COVID testing discussed   All questions were answered to the satisfaction of the patient.    Virl Cagey 12/17/2019, 11:44 AM

## 2019-12-18 LAB — HEPATIC FUNCTION PANEL
AG Ratio: 1.7 (calc) (ref 1.0–2.5)
ALT: 37 U/L — ABNORMAL HIGH (ref 6–29)
AST: 21 U/L (ref 10–35)
Albumin: 4 g/dL (ref 3.6–5.1)
Alkaline phosphatase (APISO): 60 U/L (ref 31–125)
Bilirubin, Direct: 0.1 mg/dL (ref 0.0–0.2)
Globulin: 2.4 g/dL (calc) (ref 1.9–3.7)
Indirect Bilirubin: 0.3 mg/dL (calc) (ref 0.2–1.2)
Total Bilirubin: 0.4 mg/dL (ref 0.2–1.2)
Total Protein: 6.4 g/dL (ref 6.1–8.1)

## 2019-12-18 NOTE — H&P (Signed)
Reason for Referral: Gallstones  Referring Physician: Dr. Gala Romney     Chief Complaint     New Patient (Initial Visit)      Sabrina [name] is a 49 y.o. female.  HPI: Sabrina [name] is a sweet 49 yo who has some chronic epigastric and right sided abdominal pain. She was seen by Urgent care around 2/14, and was prescribed repeat treatment for H pylori as she has had this in the past. She was then referred to Dr. Gala Romney. She describes the pain and stabbing and radiated down her left and right side. She also described some rectal bleeding. She has avoided greasy food for a while so cannot say that this makes the pain worse. She says that in the past the pain she had improved with her H pylori Treatment. She says she has been slightly better since the treatment, but still has some pain mostly in the RUQ.  She denies any vomiting but has had nausea.  EGD/ Colonoscopy - 12/04/2019 With Dr. Gala Romney  -No gastritis or ulcer  -Diverticulosis but otherwise normal      Past Medical History:  Diagnosis Date  . Hypertension         Past Surgical History:  Procedure Laterality Date  . CESAREAN SECTION    . COLONOSCOPY N/A 12/04/2019   Procedure: COLONOSCOPY; Surgeon: Daneil Dolin, MD; Location: [facility]; Service: Endoscopy; Laterality: N/A; 2:00pm  . ECTOPIC PREGNANCY SURGERY    . ESOPHAGOGASTRODUODENOSCOPY N/A 12/04/2019   Procedure: ESOPHAGOGASTRODUODENOSCOPY (EGD); Surgeon: Daneil Dolin, MD; Location: [facility]; Service: Endoscopy; Laterality: N/A;        Family History  Problem Relation Age of Onset  . Diabetes Mother   . Hypertension Mother   . Pulmonary embolism Sister   . Hypertension Other   . Diabetes Other    Social History       Tobacco Use  . Smoking status: Never Smoker  . Smokeless tobacco: Never Used  Substance Use Topics  . Alcohol use: No  . Drug use: No   Medications: I have reviewed the patient's current medications.  Allergies as of 12/17/2019   No Known  Allergies          Medication List       Accurate as of December 17, 2019 11:44 AM. If you have any questions, ask your nurse or doctor.        STOP taking these medications    amoxicillin 500 MG capsule  Commonly known as: AMOXIL  Stopped by: Virl Cagey, MD   clarithromycin 500 MG tablet  Commonly known as: BIAXIN  Stopped by: Virl Cagey, MD   fluconazole 150 MG tablet  Commonly known as: DIFLUCAN  Stopped by: Virl Cagey, MD   pantoprazole 20 MG tablet  Commonly known as: PROTONIX  Stopped by: Virl Cagey, MD   Suprep Bowel Prep Kit 17.5-3.13-1.6 GM/177ML Soln  Generic drug: Na Sulfate-K Sulfate-Mg Sulf  Stopped by: Virl Cagey, MD       TAKE these medications    amLODipine 5 MG tablet  Commonly known as: NORVASC  Take 1 tablet (5 mg total) by mouth daily.   hydrochlorothiazide 12.5 MG tablet  Commonly known as: HYDRODIURIL  Take 1 tablet (12.5 mg total) by mouth daily.  What changed:  when to take this  reasons to take this  omeprazole 20 MG capsule  Commonly known as: PRILOSEC  Take 20 mg by mouth daily.   ondansetron 4  MG disintegrating tablet  Commonly known as: ZOFRAN-ODT  Take 4 mg by mouth every 8 (eight) hours as needed.   PROBIOTIC DAILY PO  Take 1 tablet by mouth daily. 5 billion   Vitamin D (Ergocalciferol) 1.25 MG (50000 UNIT) Caps capsule  Commonly known as: DRISDOL  Take 1 capsule (50,000 Units total) by mouth once a week.       ROS:  A comprehensive review of systems was negative except for: Gastrointestinal: positive for abdominal pain, nausea and reflux symptoms  Musculoskeletal: positive for back pain, neck pain and joint pain  Blood pressure 129/84, pulse 82, temperature (!) 97.4 F (36.3 C), temperature source Oral, resp. rate 14, height 5' 5"  (1.651 m), weight 217 lb (98.4 kg), last menstrual period 11/22/2019, SpO2 98 %.  Physical Exam  Vitals reviewed.  Constitutional:  Appearance: Normal  appearance.  HENT:  Head: Normocephalic and atraumatic.  Nose: Nose normal.  Eyes:  Extraocular Movements: Extraocular movements intact.  Pupils: Pupils are equal, round, and reactive to light.  Cardiovascular:  Rate and Rhythm: Normal rate and regular rhythm.  Pulmonary:  Effort: Pulmonary effort is normal.  Breath sounds: Normal breath sounds.  Abdominal:  General: There is no distension.  Palpations: Abdomen is soft.  Tenderness: There is no abdominal tenderness.  Musculoskeletal:  General: No swelling. Normal range of motion.  Cervical back: Normal range of motion.  Skin:  General: Skin is warm and dry.  Neurological:  General: No focal deficit present.  Mental Status: She is alert and oriented to person, place, and time.  Psychiatric:  Mood and Affect: Mood normal.  Behavior: Behavior normal.  Thought Content: Thought content normal.  Judgment: Judgment normal.   Results:  RUQ [place] 3/10  CLINICAL DATA: Right upper quadrant pain. Pain for several weeks.  EXAM:  ULTRASOUND ABDOMEN LIMITED RIGHT UPPER QUADRANT  COMPARISON: None.  FINDINGS:  Gallbladder:  Filled with stones and sludge. Posterior shadowing obscures  evaluation of the posterior wall. No gallbladder wall thickening,  normal wall thickness of 2 mm. No pericholecystic fluid. No  sonographic Murphy sign noted by sonographer.  Common bile duct:  Diameter: Dilated measuring 9 mm in the midportion. No visualized  choledocholithiasis.  Liver:  No focal lesion identified. Heterogeneously increased in parenchymal  echogenicity. Portal vein is patent on color Doppler imaging with  normal direction of blood flow towards the liver.  Other: None.  IMPRESSION:  1. Gallbladder filled with stones and sludge. No sonographic  findings of acute cholecystitis.  2. Common bile duct dilatation of 9 mm. No visualized  choledocholithiasis. Consider further evaluation with MRCP for more  detailed assessment of the biliary  tree.  3. Hepatic steatosis.  Electronically Signed  By: Keith Rake M.D.  On: 12/11/2019 13:22  Assessment & Plan:  Sabrina [name] is a 49 y.o. female with gallstones on [place] and some epigastric/ RUQ pain that continues despite repeat treatment for H pylori. We discussed that some of the pain could be from her gallbladder. We discussed her dilated CBD and possibility of passing stones. We discussed that no stone is seen in her duct at this time.  -Laparoscopic cholecystectomy with intraoperative cholangiogram  PLAN: I counseled the patient about the indication, risks and benefits of laparoscopic cholecystectomy and IOC. She understands there is a very small chance for bleeding, infection, injury to normal structures (including common bile duct), conversion to open surgery, use of dye and allergies related to the dye, persistent symptoms, evolution of postcholecystectomy diarrhea, need for  secondary interventions, anesthesia reaction, cardiopulmonary issues and other risks not specifically detailed here. I described the expected recovery, the plan for follow-up and the restrictions during the recovery phase. All questions were answered.  -COVID testing discussed  All questions were answered to the satisfaction of the patient.  Virl Cagey  12/17/2019, 11:44 AM

## 2019-12-18 NOTE — Patient Instructions (Signed)
Your procedure is scheduled on: 12/25/2019  Report to [facility] at  10:30   AM.  Call this number if you have problems the morning of surgery: [phone]   Remember:   Do not Eat or Drink after midnight         No Smoking the morning of surgery  :  Take these medicines the morning of surgery with A SIP OF WATER: amlodipine and omeprazole   Do not wear jewelry, make-up or nail polish.  Do not wear lotions, powders, or perfumes. You may wear deodorant.  Do not shave 48 hours prior to surgery. Men may shave face and neck.  Do not bring valuables to the hospital.  Contacts, dentures or bridgework may not be worn into surgery.  Leave suitcase in the car. After surgery it may be brought to your room.  For patients admitted to the hospital, checkout time is 11:00 AM the day of discharge.   Patients discharged the day of surgery will not be allowed to drive home.    Special Instructions: Shower using CHG night before surgery and shower the day of surgery use CHG.  Use special wash - you have one bottle of CHG for all showers.  You should use approximately 1/2 of the bottle for each shower.  Laparoscopic Cholecystectomy, Care After This sheet gives you information about how to care for yourself after your procedure. Your health care provider may also give you more specific instructions. If you have problems or questions, contact your health care provider. What can I expect after the procedure? After the procedure, it is common to have:  Pain at your incision sites. You will be given medicines to control this pain.  Mild nausea or vomiting.  Bloating and possible shoulder pain from the air-like gas that was used during the procedure. Follow these instructions at home: Incision care   Follow instructions from your health care provider about how to take care of your incisions. Make sure you: ? Wash your hands with soap and water before you change your bandage (dressing). If soap and  water are not available, use hand sanitizer. ? Change your dressing as told by your health care provider. ? Leave stitches (sutures), skin glue, or adhesive strips in place. These skin closures may need to be in place for 2 weeks or longer. If adhesive strip edges start to loosen and curl up, you may trim the loose edges. Do not remove adhesive strips completely unless your health care provider tells you to do that.  Do not take baths, swim, or use a hot tub until your health care provider approves. Ask your health care provider if you can take showers. You may only be allowed to take sponge baths for bathing.  Check your incision area every day for signs of infection. Check for: ? More redness, swelling, or pain. ? More fluid or blood. ? Warmth. ? Pus or a bad smell. Activity  Do not drive or use heavy machinery while taking prescription pain medicine.  Do not lift anything that is heavier than 10 lb (4.5 kg) until your health care provider approves.  Do not play contact sports until your health care provider approves.  Do not drive for 24 hours if you were given a medicine to help you relax (sedative).  Rest as needed. Do not return to work or school until your health care provider approves. General instructions  Take over-the-counter and prescription medicines only as told by your health care provider.  To prevent or treat constipation while you are taking prescription pain medicine, your health care provider may recommend that you: ? Drink enough fluid to keep your urine clear or pale yellow. ? Take over-the-counter or prescription medicines. ? Eat foods that are high in fiber, such as fresh fruits and vegetables, whole grains, and beans. ? Limit foods that are high in fat and processed sugars, such as fried and sweet foods. Contact a health care provider if:  You develop a rash.  You have more redness, swelling, or pain around your incisions.  You have more fluid or blood  coming from your incisions.  Your incisions feel warm to the touch.  You have pus or a bad smell coming from your incisions.  You have a fever.  One or more of your incisions breaks open. Get help right away if:  You have trouble breathing.  You have chest pain.  You have increasing pain in your shoulders.  You faint or feel dizzy when you stand.  You have severe pain in your abdomen.  You have nausea or vomiting that lasts for more than one day.  You have leg pain. This information is not intended to replace advice given to you by your health care provider. Make sure you discuss any questions you have with your health care provider. Document Revised: 09/01/2017 Document Reviewed: 03/07/2016 Elsevier Patient Education  [place].  How to Use Chlorhexidine for Bathing Chlorhexidine gluconate (CHG) is a germ-killing (antiseptic) solution that is used to clean the skin. It can get rid of the bacteria that normally live on the skin and can keep them away for about 24 hours. To clean your skin with CHG, you may be given: A CHG solution to use in the shower or as part of a sponge bath. A prepackaged cloth that contains CHG. Cleaning your skin with CHG may help lower the risk for infection: While you are staying in the intensive care unit of the hospital. If you have a vascular access, such as a central line, to provide short-term or long-term access to your veins. If you have a catheter to drain urine from your bladder. If you are on a ventilator. A ventilator is a machine that helps you breathe by moving air in and out of your lungs. After surgery. What are the risks? Risks of using CHG include: A skin reaction. Hearing loss, if CHG gets in your ears. Eye injury, if CHG gets in your eyes and is not rinsed out. The CHG product catching fire. Make sure that you avoid smoking and flames after applying CHG to your skin. Do not use CHG: If you have a chlorhexidine allergy  or have previously reacted to chlorhexidine. On babies younger than 33 months of age. How to use CHG solution Use CHG only as told by your health care provider, and follow the instructions on the label. Use the full amount of CHG as directed. Usually, this is one bottle. During a shower Follow these steps when using CHG solution during a shower (unless your health care provider gives you different instructions): Start the shower. Use your normal soap and shampoo to wash your face and hair. Turn off the shower or move out of the shower stream. Pour the CHG onto a clean washcloth. Do not use any type of brush or rough-edged sponge. Starting at your neck, lather your body down to your toes. Make sure you follow these instructions: If you will be having surgery, pay special attention  to the part of your body where you will be having surgery. Scrub this area for at least 1 minute. Do not use CHG on your head or face. If the solution gets into your ears or eyes, rinse them well with water. Avoid your genital area. Avoid any areas of skin that have broken skin, cuts, or scrapes. Scrub your back and under your arms. Make sure to wash skin folds. Let the lather sit on your skin for 1-2 minutes or as long as told by your health care provider. Thoroughly rinse your entire body in the shower. Make sure that all body creases and crevices are rinsed well. Dry off with a clean towel. Do not put any substances on your body afterward--such as powder, lotion, or perfume--unless you are told to do so by your health care provider. Only use lotions that are recommended by the manufacturer. Put on clean clothes or pajamas. If it is the night before your surgery, sleep in clean sheets.  During a sponge bath Follow these steps when using CHG solution during a sponge bath (unless your health care provider gives you different instructions): Use your normal soap and shampoo to wash your face and hair. Pour the CHG  onto a clean washcloth. Starting at your neck, lather your body down to your toes. Make sure you follow these instructions: If you will be having surgery, pay special attention to the part of your body where you will be having surgery. Scrub this area for at least 1 minute. Do not use CHG on your head or face. If the solution gets into your ears or eyes, rinse them well with water. Avoid your genital area. Avoid any areas of skin that have broken skin, cuts, or scrapes. Scrub your back and under your arms. Make sure to wash skin folds. Let the lather sit on your skin for 1-2 minutes or as long as told by your health care provider. Using a different clean, wet washcloth, thoroughly rinse your entire body. Make sure that all body creases and crevices are rinsed well. Dry off with a clean towel. Do not put any substances on your body afterward--such as powder, lotion, or perfume--unless you are told to do so by your health care provider. Only use lotions that are recommended by the manufacturer. Put on clean clothes or pajamas. If it is the night before your surgery, sleep in clean sheets. How to use CHG prepackaged cloths Only use CHG cloths as told by your health care provider, and follow the instructions on the label. Use the CHG cloth on clean, dry skin. Do not use the CHG cloth on your head or face unless your health care provider tells you to. When washing with the CHG cloth: Avoid your genital area. Avoid any areas of skin that have broken skin, cuts, or scrapes. Before surgery Follow these steps when using a CHG cloth to clean before surgery (unless your health care provider gives you different instructions): Using the CHG cloth, vigorously scrub the part of your body where you will be having surgery. Scrub using a back-and-forth motion for 3 minutes. The area on your body should be completely wet with CHG when you are done scrubbing. Do not rinse. Discard the cloth and let the area  air-dry. Do not put any substances on the area afterward, such as powder, lotion, or perfume. Put on clean clothes or pajamas. If it is the night before your surgery, sleep in clean sheets.  For general bathing Follow these  steps when using CHG cloths for general bathing (unless your health care provider gives you different instructions). Use a separate CHG cloth for each area of your body. Make sure you wash between any folds of skin and between your fingers and toes. Wash your body in the following order, switching to a new cloth after each step: The front of your neck, shoulders, and chest. Both of your arms, under your arms, and your hands. Your stomach and groin area, avoiding the genitals. Your right leg and foot. Your left leg and foot. The back of your neck, your back, and your buttocks. Do not rinse. Discard the cloth and let the area air-dry. Do not put any substances on your body afterward--such as powder, lotion, or perfume--unless you are told to do so by your health care provider. Only use lotions that are recommended by the manufacturer. Put on clean clothes or pajamas. Contact a health care provider if: Your skin gets irritated after scrubbing. You have questions about using your solution or cloth. Get help right away if: Your eyes become very red or swollen. Your eyes itch badly. Your skin itches badly and is red or swollen. Your hearing changes. You have trouble seeing. You have swelling or tingling in your mouth or throat. You have trouble breathing. You swallow any chlorhexidine. Summary Chlorhexidine gluconate (CHG) is a germ-killing (antiseptic) solution that is used to clean the skin. Cleaning your skin with CHG may help to lower your risk for infection. You may be given CHG to use for bathing. It may be in a bottle or in a prepackaged cloth to use on your skin. Carefully follow your health care provider's instructions and the instructions on the product label. Do  not use CHG if you have a chlorhexidine allergy. Contact your health care provider if your skin gets irritated after scrubbing. This information is not intended to replace advice given to you by your health care provider. Make sure you discuss any questions you have with your health care provider. Document Revised: 12/06/2018 Document Reviewed: 08/17/2017 Elsevier Patient Education  2020 [place] Anesthesia, Adult, Care After This sheet gives you information about how to care for yourself after your procedure. Your health care provider may also give you more specific instructions. If you have problems or questions, contact your health care provider. What can I expect after the procedure? After the procedure, the following side effects are common:  Pain or discomfort at the IV site.  Nausea.  Vomiting.  Sore throat.  Trouble concentrating.  Feeling cold or chills.  Weak or tired.  Sleepiness and fatigue.  Soreness and body aches. These side effects can affect parts of the body that were not involved in surgery. Follow these instructions at home:  For at least 24 hours after the procedure:  Have a responsible adult stay with you. It is important to have someone help care for you until you are awake and alert.  Rest as needed.  Do not: ? Participate in activities in which you could fall or become injured. ? Drive. ? Use heavy machinery. ? Drink alcohol. ? Take sleeping pills or medicines that cause drowsiness. ? Make important decisions or sign legal documents. ? Take care of children on your own. Eating and drinking  Follow any instructions from your health care provider about eating or drinking restrictions.  When you feel hungry, start by eating small amounts of foods that are soft and easy to digest (bland), such as toast. Gradually  return to your regular diet.  Drink enough fluid to keep your urine pale yellow.  If you vomit, rehydrate by drinking  water, juice, or clear broth. General instructions  If you have sleep apnea, surgery and certain medicines can increase your risk for breathing problems. Follow instructions from your health care provider about wearing your sleep device: ? Anytime you are sleeping, including during daytime naps. ? While taking prescription pain medicines, sleeping medicines, or medicines that make you drowsy.  Return to your normal activities as told by your health care provider. Ask your health care provider what activities are safe for you.  Take over-the-counter and prescription medicines only as told by your health care provider.  If you smoke, do not smoke without supervision.  Keep all follow-up visits as told by your health care provider. This is important. Contact a health care provider if:  You have nausea or vomiting that does not get better with medicine.  You cannot eat or drink without vomiting.  You have pain that does not get better with medicine.  You are unable to pass urine.  You develop a skin rash.  You have a fever.  You have redness around your IV site that gets worse. Get help right away if:  You have difficulty breathing.  You have chest pain.  You have blood in your urine or stool, or you vomit blood. Summary  After the procedure, it is common to have a sore throat or nausea. It is also common to feel tired.  Have a responsible adult stay with you for the first 24 hours after general anesthesia. It is important to have someone help care for you until you are awake and alert.  When you feel hungry, start by eating small amounts of foods that are soft and easy to digest (bland), such as toast. Gradually return to your regular diet.  Drink enough fluid to keep your urine pale yellow.  Return to your normal activities as told by your health care provider. Ask your health care provider what activities are safe for you. This information is not intended to replace  advice given to you by your health care provider. Make sure you discuss any questions you have with your health care provider. Document Revised: 09/22/2017 Document Reviewed: 05/05/2017 Elsevier Patient Education  [place].

## 2019-12-23 ENCOUNTER — Other Ambulatory Visit: Payer: Self-pay

## 2019-12-23 ENCOUNTER — Other Ambulatory Visit (HOSPITAL_COMMUNITY)
Admission: RE | Admit: 2019-12-23 | Discharge: 2019-12-23 | Disposition: A | Discharge status: Home / Self Care | Payer: 59 | Source: Ambulatory Visit | Attending: General Surgery | Admitting: General Surgery

## 2019-12-23 ENCOUNTER — Encounter (HOSPITAL_COMMUNITY)
Admission: RE | Admit: 2019-12-23 | Discharge: 2019-12-23 | Disposition: A | Discharge status: Home / Self Care | Payer: 59 | Source: Ambulatory Visit | Attending: General Surgery | Admitting: General Surgery

## 2019-12-23 ENCOUNTER — Encounter (HOSPITAL_COMMUNITY): Payer: Self-pay

## 2019-12-23 DIAGNOSIS — Z01818 Encounter for other preprocedural examination: Secondary | ICD-10-CM | POA: Diagnosis not present

## 2019-12-23 DIAGNOSIS — Z20822 Contact with and (suspected) exposure to covid-19: Secondary | ICD-10-CM | POA: Diagnosis not present

## 2019-12-23 DIAGNOSIS — I10 Essential (primary) hypertension: Secondary | ICD-10-CM | POA: Diagnosis not present

## 2019-12-23 DIAGNOSIS — R7401 Elevation of levels of liver transaminase levels: Secondary | ICD-10-CM

## 2019-12-23 DIAGNOSIS — R9431 Abnormal electrocardiogram [ECG] [EKG]: Secondary | ICD-10-CM | POA: Insufficient documentation

## 2019-12-23 HISTORY — DX: Gastro-esophageal reflux disease without esophagitis: K21.9

## 2019-12-23 LAB — CBC WITH DIFFERENTIAL/PLATELET
Abs Immature Granulocytes: 0.01 10*3/uL (ref 0.00–0.07)
Basophils Absolute: 0 10*3/uL (ref 0.0–0.1)
Basophils Relative: 0 %
Eosinophils Absolute: 0 10*3/uL (ref 0.0–0.5)
Eosinophils Relative: 1 %
HCT: 37.7 % (ref 36.0–46.0)
Hemoglobin: 11.9 g/dL — ABNORMAL LOW (ref 12.0–15.0)
Immature Granulocytes: 0 %
Lymphocytes Relative: 34 %
Lymphs Abs: 1.6 10*3/uL (ref 0.7–4.0)
MCH: 30.3 pg (ref 26.0–34.0)
MCHC: 31.6 g/dL (ref 30.0–36.0)
MCV: 95.9 fL (ref 80.0–100.0)
Monocytes Absolute: 0.4 10*3/uL (ref 0.1–1.0)
Monocytes Relative: 8 %
Neutro Abs: 2.6 10*3/uL (ref 1.7–7.7)
Neutrophils Relative %: 57 %
Platelets: 304 10*3/uL (ref 150–400)
RBC: 3.93 MIL/uL (ref 3.87–5.11)
RDW: 11.6 % (ref 11.5–15.5)
WBC: 4.6 10*3/uL (ref 4.0–10.5)
nRBC: 0 % (ref 0.0–0.2)

## 2019-12-23 LAB — COMPREHENSIVE METABOLIC PANEL
ALT: 44 U/L (ref 0–44)
AST: 29 U/L (ref 15–41)
Albumin: 3.6 g/dL (ref 3.5–5.0)
Alkaline Phosphatase: 61 U/L (ref 38–126)
Anion gap: 9 (ref 5–15)
BUN: 11 mg/dL (ref 6–20)
CO2: 27 mmol/L (ref 22–32)
Calcium: 10.6 mg/dL — ABNORMAL HIGH (ref 8.9–10.3)
Chloride: 106 mmol/L (ref 98–111)
Creatinine, Ser: 0.99 mg/dL (ref 0.44–1.00)
GFR calc Af Amer: >60 mL/min (ref >60)
GFR calc non Af Amer: >60 mL/min (ref >60)
Glucose, Bld: 82 mg/dL (ref 70–99)
Potassium: 3.5 mmol/L (ref 3.5–5.1)
Sodium: 142 mmol/L (ref 135–145)
Total Bilirubin: 0.5 mg/dL (ref 0.3–1.2)
Total Protein: 6.8 g/dL (ref 6.5–8.1)

## 2019-12-23 LAB — SARS CORONAVIRUS 2 (TAT 6-24 HRS): SARS Coronavirus 2: NEGATIVE

## 2019-12-23 LAB — HCG, SERUM, QUALITATIVE: Preg, Serum: NEGATIVE

## 2019-12-25 ENCOUNTER — Ambulatory Visit (HOSPITAL_COMMUNITY): Payer: 59 | Admitting: Anesthesiology

## 2019-12-25 ENCOUNTER — Ambulatory Visit (HOSPITAL_COMMUNITY): Payer: 59

## 2019-12-25 ENCOUNTER — Encounter (HOSPITAL_COMMUNITY)
Admission: RE | Disposition: A | Discharge status: Home / Self Care | Payer: Self-pay | Source: Home / Self Care | Attending: General Surgery

## 2019-12-25 ENCOUNTER — Encounter (HOSPITAL_COMMUNITY): Payer: Self-pay | Admitting: General Surgery

## 2019-12-25 ENCOUNTER — Ambulatory Visit (HOSPITAL_COMMUNITY)
Admission: RE | Admit: 2019-12-25 | Discharge: 2019-12-25 | Disposition: A | Discharge status: Home / Self Care | Payer: 59 | Attending: General Surgery | Admitting: General Surgery

## 2019-12-25 DIAGNOSIS — Z8249 Family history of ischemic heart disease and other diseases of the circulatory system: Secondary | ICD-10-CM | POA: Insufficient documentation

## 2019-12-25 DIAGNOSIS — K838 Other specified diseases of biliary tract: Secondary | ICD-10-CM | POA: Diagnosis not present

## 2019-12-25 DIAGNOSIS — Z79899 Other long term (current) drug therapy: Secondary | ICD-10-CM | POA: Insufficient documentation

## 2019-12-25 DIAGNOSIS — K8020 Calculus of gallbladder without cholecystitis without obstruction: Secondary | ICD-10-CM | POA: Diagnosis not present

## 2019-12-25 DIAGNOSIS — K760 Fatty (change of) liver, not elsewhere classified: Secondary | ICD-10-CM | POA: Diagnosis not present

## 2019-12-25 DIAGNOSIS — K8010 Calculus of gallbladder with chronic cholecystitis without obstruction: Secondary | ICD-10-CM | POA: Insufficient documentation

## 2019-12-25 DIAGNOSIS — I10 Essential (primary) hypertension: Secondary | ICD-10-CM | POA: Diagnosis not present

## 2019-12-25 DIAGNOSIS — Z419 Encounter for procedure for purposes other than remedying health state, unspecified: Secondary | ICD-10-CM

## 2019-12-25 HISTORY — PX: CHOLECYSTECTOMY: SHX55

## 2019-12-25 SURGERY — LAPAROSCOPIC CHOLECYSTECTOMY WITH INTRAOPERATIVE CHOLANGIOGRAM
Anesthesia: General | Site: Abdomen

## 2019-12-25 MED ORDER — MIDAZOLAM HCL 2 MG/2ML IJ SOLN
INTRAMUSCULAR | Status: AC
  Filled 2019-12-25: qty 2

## 2019-12-25 MED ORDER — KETOROLAC TROMETHAMINE 30 MG/ML IJ SOLN
INTRAMUSCULAR | Status: DC | PRN
  Administered 2019-12-25: 30 mg via INTRAVENOUS

## 2019-12-25 MED ORDER — OXYCODONE HCL 5 MG PO TABS: 5.0000 mg | ORAL_TABLET | ORAL | 0 refills | Status: DC | PRN

## 2019-12-25 MED ORDER — ONDANSETRON 4 MG PO TBDP: 4.0000 mg | ORAL_TABLET | Freq: Three times a day (TID) | ORAL | 0 refills | Status: DC | PRN

## 2019-12-25 MED ORDER — HEMOSTATIC AGENTS (NO CHARGE) OPTIME
TOPICAL | Status: DC | PRN
  Administered 2019-12-25: 1 via TOPICAL

## 2019-12-25 MED ORDER — SUGAMMADEX SODIUM 500 MG/5ML IV SOLN
INTRAVENOUS | Status: DC | PRN
  Administered 2019-12-25: 200 mg via INTRAVENOUS

## 2019-12-25 MED ORDER — FENTANYL CITRATE (PF) 100 MCG/2ML IJ SOLN
INTRAMUSCULAR | Status: DC | PRN
  Administered 2019-12-25: 100 ug via INTRAVENOUS

## 2019-12-25 MED ORDER — ROCURONIUM BROMIDE 10 MG/ML (PF) SYRINGE
PREFILLED_SYRINGE | INTRAVENOUS | Status: AC
  Filled 2019-12-25: qty 10

## 2019-12-25 MED ORDER — CHLORHEXIDINE GLUCONATE CLOTH 2 % EX PADS: 6.0000 | MEDICATED_PAD | Freq: Once | CUTANEOUS | Status: DC

## 2019-12-25 MED ORDER — DOCUSATE SODIUM 100 MG PO CAPS: 100.0000 mg | ORAL_CAPSULE | Freq: Two times a day (BID) | ORAL | 2 refills | Status: DC

## 2019-12-25 MED ORDER — ROCURONIUM BROMIDE 100 MG/10ML IV SOLN
INTRAVENOUS | Status: DC | PRN
  Administered 2019-12-25: 5 mg via INTRAVENOUS
  Administered 2019-12-25: 45 mg via INTRAVENOUS

## 2019-12-25 MED ORDER — DEXAMETHASONE SODIUM PHOSPHATE 10 MG/ML IJ SOLN
INTRAMUSCULAR | Status: DC | PRN
  Administered 2019-12-25: 6 mg via INTRAVENOUS

## 2019-12-25 MED ORDER — DEXAMETHASONE SODIUM PHOSPHATE 10 MG/ML IJ SOLN
INTRAMUSCULAR | Status: AC
  Filled 2019-12-25: qty 1

## 2019-12-25 MED ORDER — SUCCINYLCHOLINE CHLORIDE 200 MG/10ML IV SOSY
PREFILLED_SYRINGE | INTRAVENOUS | Status: DC | PRN
  Administered 2019-12-25: 120 mg via INTRAVENOUS

## 2019-12-25 MED ORDER — BUPIVACAINE HCL (PF) 0.5 % IJ SOLN
INTRAMUSCULAR | Status: DC | PRN
  Administered 2019-12-25: 10 mL

## 2019-12-25 MED ORDER — ONDANSETRON HCL 4 MG/2ML IJ SOLN
INTRAMUSCULAR | Status: AC
  Filled 2019-12-25: qty 2

## 2019-12-25 MED ORDER — LIDOCAINE HCL (CARDIAC) PF 100 MG/5ML IV SOSY
PREFILLED_SYRINGE | INTRAVENOUS | Status: DC | PRN
  Administered 2019-12-25: 60 mg via INTRAVENOUS

## 2019-12-25 MED ORDER — 0.9 % SODIUM CHLORIDE (POUR BTL) OPTIME
TOPICAL | Status: DC | PRN
  Administered 2019-12-25: 1000 mL

## 2019-12-25 MED ORDER — LIDOCAINE 2% (20 MG/ML) 5 ML SYRINGE
INTRAMUSCULAR | Status: AC
  Filled 2019-12-25: qty 5

## 2019-12-25 MED ORDER — LACTATED RINGERS IV SOLN
INTRAVENOUS | Status: DC
  Administered 2019-12-25: 1000 mL via INTRAVENOUS

## 2019-12-25 MED ORDER — PROPOFOL 10 MG/ML IV BOLUS
INTRAVENOUS | Status: AC
  Filled 2019-12-25: qty 20

## 2019-12-25 MED ORDER — FENTANYL CITRATE (PF) 100 MCG/2ML IJ SOLN
INTRAMUSCULAR | Status: AC
  Filled 2019-12-25: qty 2

## 2019-12-25 MED ORDER — BUPIVACAINE HCL (PF) 0.5 % IJ SOLN
INTRAMUSCULAR | Status: AC
  Filled 2019-12-25: qty 30

## 2019-12-25 MED ORDER — ONDANSETRON HCL 4 MG/2ML IJ SOLN
INTRAMUSCULAR | Status: DC | PRN
  Administered 2019-12-25: 4 mg via INTRAVENOUS

## 2019-12-25 MED ORDER — SUCCINYLCHOLINE CHLORIDE 200 MG/10ML IV SOSY
PREFILLED_SYRINGE | INTRAVENOUS | Status: AC
  Filled 2019-12-25: qty 10

## 2019-12-25 MED ORDER — SODIUM CHLORIDE (PF) 0.9 % IJ SOLN
INTRAMUSCULAR | Status: DC | PRN
  Administered 2019-12-25: 25 mL

## 2019-12-25 MED ORDER — PROPOFOL 10 MG/ML IV BOLUS
INTRAVENOUS | Status: DC | PRN
  Administered 2019-12-25: 200 mg via INTRAVENOUS

## 2019-12-25 MED ORDER — SODIUM CHLORIDE 0.9 % IV SOLN
2.0000 g | INTRAVENOUS | Status: AC
  Administered 2019-12-25: 2 g via INTRAVENOUS
  Filled 2019-12-25: qty 2

## 2019-12-25 MED ORDER — FENTANYL CITRATE (PF) 100 MCG/2ML IJ SOLN
25.0000 ug | INTRAMUSCULAR | Status: DC | PRN
  Administered 2019-12-25: 50 ug via INTRAVENOUS

## 2019-12-25 MED ORDER — MIDAZOLAM HCL 5 MG/5ML IJ SOLN
INTRAMUSCULAR | Status: DC | PRN
  Administered 2019-12-25: 2 mg via INTRAVENOUS

## 2019-12-25 SURGICAL SUPPLY — 50 items
ADH SKN CLS APL DERMABOND .7 (GAUZE/BANDAGES/DRESSINGS) ×1
APL PRP STRL LF DISP 70% ISPRP (MISCELLANEOUS) ×1
APPLIER CLIP ROT 10 11.4 M/L (STAPLE) ×2
APR CLP MED LRG 11.4X10 (STAPLE) ×1
BLADE SURG 15 STRL LF DISP TIS (BLADE) ×1 IMPLANT
BLADE SURG 15 STRL SS (BLADE) ×2
CATH CHOLANGIOGRAM 4.5FR (CATHETERS) ×1 IMPLANT
CHLORAPREP W/TINT 26 (MISCELLANEOUS) ×2 IMPLANT
CLIP APPLIE ROT 10 11.4 M/L (STAPLE) ×1 IMPLANT
CLOTH BEACON ORANGE TIMEOUT ST (SAFETY) ×2 IMPLANT
COVER LIGHT HANDLE STERIS (MISCELLANEOUS) ×4 IMPLANT
COVER WAND RF STERILE (DRAPES) ×2 IMPLANT
DECANTER SPIKE VIAL GLASS SM (MISCELLANEOUS) ×4 IMPLANT
DERMABOND ADVANCED (GAUZE/BANDAGES/DRESSINGS) ×1
DERMABOND ADVANCED .7 DNX12 (GAUZE/BANDAGES/DRESSINGS) ×1 IMPLANT
DRAPE C-ARM FOLDED MOBILE STRL (DRAPES) ×2 IMPLANT
ELECT REM PT RETURN 9FT ADLT (ELECTROSURGICAL) ×2
ELECTRODE REM PT RTRN 9FT ADLT (ELECTROSURGICAL) ×1 IMPLANT
GLOVE BIO SURGEON STRL SZ 6.5 (GLOVE) ×3 IMPLANT
GLOVE BIO SURGEON STRL SZ7 (GLOVE) ×1 IMPLANT
GLOVE BIOGEL PI IND STRL 6.5 (GLOVE) ×1 IMPLANT
GLOVE BIOGEL PI IND STRL 7.0 (GLOVE) ×3 IMPLANT
GLOVE BIOGEL PI INDICATOR 6.5 (GLOVE) ×2
GLOVE BIOGEL PI INDICATOR 7.0 (GLOVE) ×3
GLOVE SURG SS PI 7.5 STRL IVOR (GLOVE) ×4 IMPLANT
GOWN STRL REUS W/TWL LRG LVL3 (GOWN DISPOSABLE) ×6 IMPLANT
HEMOSTAT SNOW SURGICEL 2X4 (HEMOSTASIS) ×2 IMPLANT
INST SET LAPROSCOPIC AP (KITS) ×2 IMPLANT
KIT TURNOVER KIT A (KITS) ×2 IMPLANT
MANIFOLD NEPTUNE II (INSTRUMENTS) ×2 IMPLANT
NEEDLE INSUFFLATION 120MM (ENDOMECHANICALS) ×2 IMPLANT
NS IRRIG 1000ML POUR BTL (IV SOLUTION) ×2 IMPLANT
PACK LAP CHOLE LZT030E (CUSTOM PROCEDURE TRAY) ×2 IMPLANT
PAD ARMBOARD 7.5X6 YLW CONV (MISCELLANEOUS) ×2 IMPLANT
SET BASIN LINEN APH (SET/KITS/TRAYS/PACK) ×2 IMPLANT
SET TUBE IRRIG SUCTION NO TIP (IRRIGATION / IRRIGATOR) IMPLANT
SET TUBE SMOKE EVAC HIGH FLOW (TUBING) ×2 IMPLANT
SLEEVE ENDOPATH XCEL 5M (ENDOMECHANICALS) ×2 IMPLANT
SUT MNCRL AB 4-0 PS2 18 (SUTURE) ×3 IMPLANT
SUT VICRYL 0 UR6 27IN ABS (SUTURE) ×2 IMPLANT
SYR 20ML LL LF (SYRINGE) ×2 IMPLANT
SYR 30ML LL (SYRINGE) ×2 IMPLANT
SYR CONTROL 10ML LL (SYRINGE) ×2 IMPLANT
SYS BAG RETRIEVAL 10MM (BASKET) ×2
SYSTEM BAG RETRIEVAL 10MM (BASKET) ×1 IMPLANT
TROCAR ENDO BLADELESS 11MM (ENDOMECHANICALS) ×2 IMPLANT
TROCAR XCEL NON-BLD 5MMX100MML (ENDOMECHANICALS) ×2 IMPLANT
TROCAR XCEL UNIV SLVE 11M 100M (ENDOMECHANICALS) ×2 IMPLANT
WARMER LAPAROSCOPE (MISCELLANEOUS) ×2 IMPLANT
YANKAUER SUCT 12FT TUBE ARGYLE (SUCTIONS) ×2 IMPLANT

## 2019-12-25 NOTE — Anesthesia Procedure Notes (Signed)
Procedure Name: Intubation Date/Time: 12/25/2019 11:28 AM Performed by: Jonna Munro, CRNA Pre-anesthesia Checklist: Patient identified, Emergency Drugs available, Suction available, Patient being monitored and Timeout performed Patient Re-evaluated:Patient Re-evaluated prior to induction Oxygen Delivery Method: Circle system utilized Preoxygenation: Pre-oxygenation with 100% oxygen Induction Type: IV induction and Rapid sequence Laryngoscope Size: Mac and 4 Grade View: Grade I Tube type: Oral Tube size: 7.0 mm Number of attempts: 1 Airway Equipment and Method: Stylet Placement Confirmation: ETT inserted through vocal cords under direct vision,  positive ETCO2 and breath sounds checked- equal and bilateral Secured at: 22 cm Tube secured with: Tape Dental Injury: Teeth and Oropharynx as per pre-operative assessment

## 2019-12-25 NOTE — Progress Notes (Signed)
To Whom it may Concern,  Sabrina [name] had surgery on 11/27/2019.  She will be seen by her surgeon on 01/09/2020.  She is unable work until then and will be readdressed at that time.

## 2019-12-25 NOTE — Interval H&P Note (Signed)
History and Physical Interval Note:  12/25/2019 10:39 AM  Sabrina [name]  has presented today for surgery, with the diagnosis of Cholelithiasis.  The various methods of treatment have been discussed with the patient and family. After consideration of risks, benefits and other options for treatment, the patient has consented to  Procedure(s): LAPAROSCOPIC CHOLECYSTECTOMY WITH INTRAOPERATIVE CHOLANGIOGRAM (N/A) as a surgical intervention.  The patient's history has been reviewed, patient examined, no change in status, stable for surgery.  I have reviewed the patient's chart and labs.  Questions were answered to the patient's satisfaction.    No questions or changes. LFTs wnl, reassuring. Will try for IOC given CBD dilation. Virl Cagey

## 2019-12-25 NOTE — Transfer of Care (Signed)
Immediate Anesthesia Transfer of Care Note  Patient: Sabrina [name]  Procedure(s) Performed: LAPAROSCOPIC CHOLECYSTECTOMY WITH INTRAOPERATIVE CHOLANGIOGRAM (N/A Abdomen)  Patient Location: PACU  Anesthesia Type:General  Level of Consciousness: awake, alert , oriented and patient cooperative  Airway & Oxygen Therapy: Patient Spontanous Breathing and Patient connected to face mask oxygen  Post-op Assessment: Report given to RN, Post -op Vital signs reviewed and stable and Patient moving all extremities X 4  Post vital signs: Reviewed and stable  Last Vitals:  Vitals Value Taken Time  BP    Temp    Pulse    Resp    SpO2      Last Pain:  Vitals:   12/25/19 1037  TempSrc: Oral  PainSc: 0-No pain      Patients Stated Pain Goal: 6 ([id number])  Complications: No apparent anesthesia complications

## 2019-12-25 NOTE — Anesthesia Preprocedure Evaluation (Signed)
Anesthesia Evaluation  Patient identified by MRN, date of birth, ID band Patient awake    Reviewed: Allergy & Precautions, H&P , NPO status , Patient's Chart, lab work & pertinent test results, reviewed documented beta blocker date and time   Airway Mallampati: II  TM Distance: >3 FB     Dental no notable dental hx.    Pulmonary neg pulmonary ROS,    breath sounds clear to auscultation       Cardiovascular hypertension,  Rhythm:regular Rate:Normal     Neuro/Psych negative neurological ROS  negative psych ROS   GI/Hepatic Neg liver ROS, GERD  ,  Endo/Other  negative endocrine ROS  Renal/GU negative Renal ROS     Musculoskeletal   Abdominal   Peds  Hematology negative hematology ROS (+)   Anesthesia Other Findings   Reproductive/Obstetrics negative OB ROS                             Anesthesia Physical Anesthesia Plan  ASA: III  Anesthesia Plan: General   Post-op Pain Management:    Induction:   PONV Risk Score and Plan: 2 and Ondansetron  Airway Management Planned:   Additional Equipment:   Intra-op Plan:   Post-operative Plan:   Informed Consent: I have reviewed the patients History and Physical, chart, labs and discussed the procedure including the risks, benefits and alternatives for the proposed anesthesia with the patient or authorized representative who has indicated his/her understanding and acceptance.       Plan Discussed with: CRNA  Anesthesia Plan Comments:         Anesthesia Quick Evaluation

## 2019-12-25 NOTE — Anesthesia Postprocedure Evaluation (Signed)
Anesthesia Post Note  Patient: Sabrina [name]  Procedure(s) Performed: LAPAROSCOPIC CHOLECYSTECTOMY WITH INTRAOPERATIVE CHOLANGIOGRAM (N/A Abdomen)  Patient location during evaluation: PACU Anesthesia Type: General Level of consciousness: awake, oriented, awake and alert and patient cooperative Pain management: pain level controlled Vital Signs Assessment: post-procedure vital signs reviewed and stable Respiratory status: spontaneous breathing, respiratory function stable and nonlabored ventilation Cardiovascular status: stable Postop Assessment: no apparent nausea or vomiting Anesthetic complications: no     Last Vitals:  Vitals:   12/25/19 1037  BP: (!) 136/98  Pulse: 81  Resp: 20  Temp: 36.7 C  SpO2: 98%    Last Pain:  Vitals:   12/25/19 1037  TempSrc: Oral  PainSc: 0-No pain                 Tulsi Crossett

## 2019-12-25 NOTE — Discharge Instructions (Signed)
Discharge Laparoscopic Surgery Instructions:  Common Complaints: Right shoulder pain is common after laparoscopic surgery. This is secondary to the gas used in the surgery being trapped under the diaphragm.  Walk to help your body absorb the gas. This will improve in a few days. Pain at the port sites are common, especially the larger port sites. This will improve with time.  Some nausea is common and poor appetite. The main goal is to stay hydrated the first few days after surgery.   Diet/ Activity: Diet as tolerated. You may not have an appetite, but it is important to stay hydrated. Drink 64 ounces of water a day. Your appetite will return with time.  Shower per your regular routine daily.  Do not take hot showers. Take warm showers that are less than 10 minutes. Rest and listen to your body, but do not remain in bed all day.  Walk everyday for at least 15-20 minutes. Deep cough and move around every 1-2 hours in the first few days after surgery.  Do not lift > 10 lbs, perform excessive bending, pushing, pulling, squatting for 1-2 weeks after surgery.  Do not pick at the dermabond glue on your incision sites.  This glue film will remain in place for 1-2 weeks and will start to peel off.  Do not place lotions or balms on your incision unless instructed to specifically by Dr. Bridges.   Pain Expectations and Narcotics: -After surgery you will have pain associated with your incisions and this is normal. The pain is muscular and nerve pain, and will get better with time. -You are encouraged and expected to take non narcotic medications like tylenol and ibuprofen (when able) to treat pain as multiple modalities can aid with pain treatment. -Narcotics are only used when pain is severe or there is breakthrough pain. -You are not expected to have a pain score of 0 after surgery, as we cannot prevent pain. A pain score of 3-4 that allows you to be functional, move, walk, and tolerate some activity is  the goal. The pain will continue to improve over the days after surgery and is dependent on your surgery. -Due to [place] law, we are only able to give a certain amount of pain medication to treat post operative pain, and we only give additional narcotics on a patient by patient basis.  -For most laparoscopic surgery, studies have shown that the majority of patients only need 10-15 narcotic pills, and for open surgeries most patients only need 15-20.   -Having appropriate expectations of pain and knowledge of pain management with non narcotics is important as we do not want anyone to become addicted to narcotic pain medication.  -Using ice packs in the first 48 hours and heating pads after 48 hours, wearing an abdominal binder (when recommended), and using over the counter medications are all ways to help with pain management.   -Simple acts like meditation and mindfulness practices after surgery can also help with pain control and research has proven the benefit of these practices.  Medication: Take tylenol and ibuprofen as needed for pain control, alternating every 4-6 hours.  Example:  Tylenol 1000mg @ 6am, 12noon, 6pm, 12midnight (Do not exceed 4000mg of tylenol a day). Ibuprofen 800mg @ 9am, 3pm, 9pm, 3am (Do not exceed 3600mg of ibuprofen a day).  Take Roxicodone for breakthrough pain every 4 hours.  Take Colace for constipation related to narcotic pain medication. If you do not have a bowel movement in 2 days, take Miralax   over the counter.  Drink plenty of water to also prevent constipation.   Contact Information: If you have questions or concerns, please call our office, [phone], Monday- Thursday 8AM-5PM and Friday 8AM-12Noon.  If it is after hours or on the weekend, please call [facility], [phone], and ask to speak to the surgeon on call for Dr. Constance Haw at [facility].    Laparoscopic Cholecystectomy, Care After This sheet gives you information about how to care for  yourself after your procedure. Your health care provider may also give you more specific instructions. If you have problems or questions, contact your health care provider. What can I expect after the procedure? After the procedure, it is common to have:  Pain at your incision sites. You will be given medicines to control this pain.  Mild nausea or vomiting.  Bloating and possible shoulder pain from the air-like gas that was used during the procedure. Follow these instructions at home: Incision care   Follow instructions from your health care provider about how to take care of your incisions. Make sure you: ? Wash your hands with soap and water before you change your bandage (dressing). If soap and water are not available, use hand sanitizer. ? Change your dressing as told by your health care provider. ? Leave stitches (sutures), skin glue, or adhesive strips in place. These skin closures may need to be in place for 2 weeks or longer. If adhesive strip edges start to loosen and curl up, you may trim the loose edges. Do not remove adhesive strips completely unless your health care provider tells you to do that.  Do not take baths, swim, or use a hot tub until your health care provider approves.   You may shower.  Check your incision area every day for signs of infection. Check for: ? More redness, swelling, or pain. ? More fluid or blood. ? Warmth. ? Pus or a bad smell. Activity  Do not drive or use heavy machinery while taking prescription pain medicine.  Do not lift anything that is heavier than 10 lb (4.5 kg) until your health care provider approves.  Do not play contact sports until your health care provider approves.  Do not drive for 24 hours if you were given a medicine to help you relax (sedative).  Rest as needed. Do not return to work or school until your health care provider approves. General instructions  Take over-the-counter and prescription medicines only as told  by your health care provider.  To prevent or treat constipation while you are taking prescription pain medicine, your health care provider may recommend that you: ? Drink enough fluid to keep your urine clear or pale yellow. ? Take over-the-counter or prescription medicines. ? Eat foods that are high in fiber, such as fresh fruits and vegetables, whole grains, and beans. ? Limit foods that are high in fat and processed sugars, such as fried and sweet foods. Contact a health care provider if:  You develop a rash.  You have more redness, swelling, or pain around your incisions.  You have more fluid or blood coming from your incisions.  Your incisions feel warm to the touch.  You have pus or a bad smell coming from your incisions.  You have a fever.  One or more of your incisions breaks open. Get help right away if:  You have trouble breathing.  You have chest pain.  You have increasing pain in your shoulders.  You faint or feel dizzy when  you stand.  You have severe pain in your abdomen.  You have nausea or vomiting that lasts for more than one day.  You have leg pain. This information is not intended to replace advice given to you by your health care provider. Make sure you discuss any questions you have with your health care provider. Document Revised: 09/01/2017 Document Reviewed: 03/07/2016 Elsevier Patient Education  2020 [place] Anesthesia, Adult, Care After This sheet gives you information about how to care for yourself after your procedure. Your health care provider may also give you more specific instructions. If you have problems or questions, contact your health care provider. What can I expect after the procedure? After the procedure, the following side effects are common:  Pain or discomfort at the IV site.  Nausea.  Vomiting.  Sore throat.  Trouble concentrating.  Feeling cold or chills.  Weak or tired.  Sleepiness and  fatigue.  Soreness and body aches. These side effects can affect parts of the body that were not involved in surgery. Follow these instructions at home:  For at least 24 hours after the procedure:  Have a responsible adult stay with you. It is important to have someone help care for you until you are awake and alert.  Rest as needed.  Do not: ? Participate in activities in which you could fall or become injured. ? Drive. ? Use heavy machinery. ? Drink alcohol. ? Take sleeping pills or medicines that cause drowsiness. ? Make important decisions or sign legal documents. ? Take care of children on your own. Eating and drinking  Follow any instructions from your health care provider about eating or drinking restrictions.  When you feel hungry, start by eating small amounts of foods that are soft and easy to digest (bland), such as toast. Gradually return to your regular diet.  Drink enough fluid to keep your urine pale yellow.  If you vomit, rehydrate by drinking water, juice, or clear broth. General instructions  If you have sleep apnea, surgery and certain medicines can increase your risk for breathing problems. Follow instructions from your health care provider about wearing your sleep device: ? Anytime you are sleeping, including during daytime naps. ? While taking prescription pain medicines, sleeping medicines, or medicines that make you drowsy.  Return to your normal activities as told by your health care provider. Ask your health care provider what activities are safe for you.  Take over-the-counter and prescription medicines only as told by your health care provider.  If you smoke, do not smoke without supervision.  Keep all follow-up visits as told by your health care provider. This is important. Contact a health care provider if:  You have nausea or vomiting that does not get better with medicine.  You cannot eat or drink without vomiting.  You have pain that  does not get better with medicine.  You are unable to pass urine.  You develop a skin rash.  You have a fever.  You have redness around your IV site that gets worse. Get help right away if:  You have difficulty breathing.  You have chest pain.  You have blood in your urine or stool, or you vomit blood. Summary  After the procedure, it is common to have a sore throat or nausea. It is also common to feel tired.  Have a responsible adult stay with you for the first 24 hours after general anesthesia. It is important to have someone help care for you until  you are awake and alert.  When you feel hungry, start by eating small amounts of foods that are soft and easy to digest (bland), such as toast. Gradually return to your regular diet.  Drink enough fluid to keep your urine pale yellow.  Return to your normal activities as told by your health care provider. Ask your health care provider what activities are safe for you. This information is not intended to replace advice given to you by your health care provider. Make sure you discuss any questions you have with your health care provider. Document Revised: 09/22/2017 Document Reviewed: 05/05/2017 Elsevier Patient Education  [place].

## 2019-12-25 NOTE — Progress Notes (Signed)
[facility]  Spoke with husband. Surgery completed. Able to do IOC and no obvious stones.   Rx sent to [facility] in [place].   Curlene Labrum, MD [facility] [address] [place], [place] [address] [phone] (office)

## 2019-12-25 NOTE — Op Note (Signed)
Operative Note   Preoperative Diagnosis: Symptomatic cholelithiasis, dilated common bile duct   Postoperative Diagnosis: Same   Procedure(s) Performed: Laparoscopic cholecystectomy, intraoperative cholangiogram    Surgeon: Lanell Matar. Constance Haw, MD   Assistants: Aviva Signs, MD    Anesthesia: General endotracheal   Anesthesiologist: Dr. Briant Cedar, MD    Specimens: Gallbladder    Estimated Blood Loss: Minimal    Blood Replacement: None    Complications: None    Operative Findings: Dilated common duct but no obvious filling defects, filled duodenum    Procedure: The patient was taken to the operating room and placed supine. General endotracheal anesthesia was induced. Intravenous antibiotics were administered per protocol. An orogastric tube positioned to decompress the stomach. The abdomen was prepared and draped in the usual sterile fashion.    A supraumbilical incision was made and a Veress technique was utilized to achieve pneumoperitoneum to 15 mmHg with carbon dioxide. A 11 mm optiview port was placed through the supraumbilical region, and a 10 mm 0-degree operative laparoscope was introduced. The area underlying the trocar and Veress needle were inspected and without evidence of injury.  Remaining trocars were placed under direct vision. Two 5 mm ports were placed in the right abdomen, between the anterior axillary and midclavicular line.  A final 11 mm port was placed through the mid-epigastrium, near the falciform ligament.    The gallbladder fundus was elevated cephalad and the infundibulum was retracted to the patient's right. The gallbladder/cystic duct junction was skeletonized. The cystic artery noted in the triangle of Calot and was also skeletonized.  We then continued liberal medial and lateral dissection until the critical view of safety was achieved.    The cystic artery was doubly clipped and divided.  There was a small possible posterior vessel in the mesentery at the  hepatic bed that was clipped. This freed the gallbladder to allow for the cholangiogram. A single clip was placed distal, a cystotomy was performed on the cystic duct. Using the cholangiogram clamp and catheter saline was injected into the cystic duct and flowed well.  A cholangiogram was performed using dye and demonstrated filling of the duodenum, back flow into the hepatic ducts, and a dilated common duct that tapered down to a normal caliber duct, there were no filling defects noted. From here the cystic duct had two clips placed proximal and it was divided. The gallbladder was then dissected from the liver bed with electrocautery. The specimen was placed in an Endopouch and was retrieved through the epigastric site.   Final inspection revealed acceptable hemostasis. Surgical SNOW was placed in the gallbladder bed.  Trocars were removed and pneumoperitoneum was released.  The epigastric site was over the ribs and the umbilical incision was smaller than my finger tip.  Skin incisions were closed with 4-0 Monocryl subcuticular sutures and Dermabond. The patient was awakened from anesthesia and extubated without complication.    Curlene Labrum, MD [facility] [address] [place], [place] [address] [phone] (office)

## 2019-12-26 LAB — SURGICAL PATHOLOGY

## 2020-01-09 ENCOUNTER — Telehealth (INDEPENDENT_AMBULATORY_CARE_PROVIDER_SITE_OTHER): Payer: Self-pay | Admitting: General Surgery

## 2020-01-09 ENCOUNTER — Telehealth: Payer: Self-pay

## 2020-01-09 ENCOUNTER — Other Ambulatory Visit: Payer: Self-pay

## 2020-01-09 DIAGNOSIS — K8020 Calculus of gallbladder without cholecystitis without obstruction: Secondary | ICD-10-CM

## 2020-01-09 NOTE — Progress Notes (Signed)
[facility]  I am calling the patient for post operative evaluation due to the current COVID 19 pandemic.  The patient had a laparoscopic cholecystectomy on 3/24. The patient reports that they are doing . The are tolerating a diet, having good pain control, and having regular Bms.  The patient has no concerns.  She has some soreness and is not able to sleep on that side. The incisions are healing well.   Will see the patient PRN.  She has been out of work and feeling much better. She has an appt with GI on 4/13 and wants to return to work on 4/14. I think this is a good plan and she will be ready to return to work without restrictions at that time.   Pathology: FINAL MICROSCOPIC DIAGNOSIS:   A. GALLBLADDER, CHOLECYSTECTOMY:  - Chronic cholecystitis.  - Cholesterolosis.  - Cholelithiasis.  Curlene Labrum, MD [facility] [address] [place], [place] [address] [phone] (office)

## 2020-01-09 NOTE — Telephone Encounter (Signed)
[employer] disability  paperwork completed and faxed to [phone]. Return to work date 01/15/20 with no restrictions.  Patient notified.

## 2020-01-13 NOTE — Progress Notes (Signed)
Referring Provider: [name], FNP Primary Care Physician:  [name], FNP Primary GI: Dr. Gala Romney  Chief Complaint  Patient presents with  . Abdominal Pain    left side discomfort that comes/goes. TCS/EGD done 12/2019    HPI:   Sabrina [name] is a 49 y.o. female presenting today with a history of abdominal pain, nausea, returning in follow-up since EGD and colonoscopy. Both unrevealing overall. Underwent lab chole March 2021 in interim from last appt.   Has left-sided discomfort intermittently. Not sharp or excruciating. Described as strong discomfort. Will last about 5 minutes. No nausea or vomiting. Good appetite. Still trying to figure out food choices. Unable to pinpoint triggers to left-sided abdomen discomfort. Happens twice per week. Prilosec BID. No weight loss. Overall feels much improved.   Has had a regular cycle since episode of irregular vaginal bleeding. Will be arranging an appt with GYN.   Past Medical History:  Diagnosis Date  . GERD (gastroesophageal reflux disease)   . Hypertension     Past Surgical History:  Procedure Laterality Date  . CESAREAN SECTION    . CHOLECYSTECTOMY N/A 12/25/2019   Procedure: LAPAROSCOPIC CHOLECYSTECTOMY WITH INTRAOPERATIVE CHOLANGIOGRAM;  Surgeon: Virl Cagey, MD;  Location: [facility];  Service: General;  Laterality: N/A;  . COLONOSCOPY N/A 12/04/2019    sigmoid diverticulosis.  Marland Kitchen ECTOPIC PREGNANCY SURGERY    . ESOPHAGOGASTRODUODENOSCOPY N/A 12/04/2019   normal esophagus, stomach, and duodenum.     Current Outpatient Medications  Medication Sig Dispense Refill  . amLODipine (NORVASC) 5 MG tablet Take 1 tablet (5 mg total) by mouth daily. (Patient taking differently: Take 5 mg by mouth at bedtime. ) 90 tablet 4  . hydrochlorothiazide (HYDRODIURIL) 12.5 MG tablet Take 1 tablet (12.5 mg total) by mouth daily. (Patient taking differently: Take 12.5 mg by mouth daily as needed (fluid). ) 90 tablet 3  .  omeprazole (PRILOSEC) 20 MG capsule Take 20 mg by mouth in the morning and at bedtime.     . ondansetron (ZOFRAN-ODT) 4 MG disintegrating tablet Take 1 tablet (4 mg total) by mouth every 8 (eight) hours as needed for nausea or vomiting. 20 tablet 0  . senna (SENOKOT) 8.6 MG TABS tablet Take 1 tablet by mouth daily as needed for mild constipation.    . Vitamin D, Ergocalciferol, (DRISDOL) 1.25 MG (50000 UT) CAPS capsule Take 1 capsule (50,000 Units total) by mouth once a week. (Patient taking differently: Take 50,000 Units by mouth every Sunday. ) 12 capsule 3   No current facility-administered medications for this visit.    Allergies as of 01/14/2020  . (No Known Allergies)    Family History  Problem Relation Age of Onset  . Diabetes Mother   . Hypertension Mother   . Pulmonary embolism Sister   . Hypertension Other   . Diabetes Other     Social History   Socioeconomic History  . Marital status: Married    Spouse name: Not on file  . Number of children: Not on file  . Years of education: Not on file  . Highest education level: Not on file  Occupational History  . Not on file  Tobacco Use  . Smoking status: Never Smoker  . Smokeless tobacco: Never Used  Substance and Sexual Activity  . Alcohol use: No  . Drug use: No  . Sexual activity: Yes    Birth control/protection: Surgical  Other Topics Concern  . Not on file  Social  History Narrative  . Not on file   Social Determinants of Health   Financial Resource Strain:   . Difficulty of Paying Living Expenses:   Food Insecurity:   . Worried About Charity fundraiser in the Last Year:   . Arboriculturist in the Last Year:   Transportation Needs:   . Film/video editor (Medical):   Marland Kitchen Lack of Transportation (Non-Medical):   Physical Activity:   . Days of Exercise per Week:   . Minutes of Exercise per Session:   Stress:   . Feeling of Stress :   Social Connections:   . Frequency of Communication with Friends and  Family:   . Frequency of Social Gatherings with Friends and Family:   . Attends Religious Services:   . Active Member of Clubs or Organizations:   . Attends Archivist Meetings:   Marland Kitchen Marital Status:     Review of Systems: Gen: Denies fever, chills, anorexia. Denies fatigue, weakness, weight loss.  CV: Denies chest pain, palpitations, syncope, peripheral edema, and claudication. Resp: Denies dyspnea at rest, cough, wheezing, coughing up blood, and pleurisy. GI: see HPI Derm: Denies rash, itching, dry skin Psych: Denies depression, anxiety, memory loss, confusion. No homicidal or suicidal ideation.  Heme: Denies bruising, bleeding, and enlarged lymph nodes.  Physical Exam: BP 133/90   Pulse 69   Temp (!) 97.1 F (36.2 C) (Oral)   Ht 5\' 6"  (1.676 m)   Wt 212 lb 3.2 oz (96.3 kg)   LMP 12/22/2019   BMI 34.25 kg/m  General:   Alert and oriented. No distress noted. Pleasant and cooperative.  Head:  Normocephalic and atraumatic. Eyes:  Conjuctiva clear without scleral icterus. Mouth:  Mask in place Abdomen:  +BS, soft, non-tender and non-distended. No rebound or guarding. No HSM or masses noted. Msk:  Symmetrical without gross deformities. Normal posture. Extremities:  Without edema. Neurologic:  Alert and  oriented x4 Psych:  Alert and cooperative. Normal mood and affect.  ASSESSMENT: Sabrina [name] is a 49 y.o. female presenting today with history of dyspepsia, rectal bleeding, returning in follow-up from EGD/colonoscopy.  EGD unrevealing. Has noted marked improvement after cholecystectomy about a week ago. Found to have gallstones on [place]. Notes vague left-sided discomfort lasting about 5 minutes twice a week but unable to pinpoint any triggers or associated signs/symptoms. No alarm signs/symptoms. I have asked her to keep a food/bowel habits journal and document when this happens. Likely benign process. She is to call with update.   Next colonoscopy will be in 10  years.  As we are not managing any long-term medications, she can return as needed or if symptoms worsen. She will call with update in 2 weeks.    PLAN:    Keep food/bowel habits journal  Call with update in 2 weeks  Continue omeprazole  Return prn  Next colonoscopy in 10 years   Annitta Needs, PhD, ANP-BC [facility]

## 2020-01-14 ENCOUNTER — Encounter: Payer: Self-pay | Admitting: Gastroenterology

## 2020-01-14 ENCOUNTER — Ambulatory Visit (INDEPENDENT_AMBULATORY_CARE_PROVIDER_SITE_OTHER): Payer: 59 | Admitting: Gastroenterology

## 2020-01-14 ENCOUNTER — Other Ambulatory Visit: Payer: Self-pay

## 2020-01-14 DIAGNOSIS — R109 Unspecified abdominal pain: Secondary | ICD-10-CM | POA: Diagnosis not present

## 2020-01-14 NOTE — Progress Notes (Signed)
Cc'ed to pcp °

## 2020-01-14 NOTE — Patient Instructions (Signed)
Please keep a food and bowel habits log, to see if there is any correlation with the left-sided discomfort.  Please call with an update in 2 weeks.  We will see you back as needed!   I enjoyed seeing you again today! As you know, I value our relationship and want to provide genuine, compassionate, and quality care. I welcome your feedback. If you receive a survey regarding your visit,  I greatly appreciate you taking time to fill this out. See you next time!  Annitta Needs, PhD, ANP-BC [facility]

## 2020-02-04 ENCOUNTER — Other Ambulatory Visit: Payer: Self-pay | Admitting: Emergency Medicine

## 2020-02-04 DIAGNOSIS — R7401 Elevation of levels of liver transaminase levels: Secondary | ICD-10-CM

## 2020-02-05 ENCOUNTER — Telehealth: Payer: Self-pay

## 2020-02-05 NOTE — Telephone Encounter (Signed)
[employer] disability paperwork completed and faxed to [phone]. Return to work date 01/15/20.

## 2020-02-18 NOTE — Progress Notes (Signed)
Spoke with pt. Labs are due this Friday 02/21/20. Pt is going to have labs completed.

## 2020-02-20 LAB — HEPATIC FUNCTION PANEL
AG Ratio: 1.7 (calc) (ref 1.0–2.5)
ALT: 21 U/L (ref 6–29)
AST: 14 U/L (ref 10–35)
Albumin: 4 g/dL (ref 3.6–5.1)
Alkaline phosphatase (APISO): 64 U/L (ref 31–125)
Bilirubin, Direct: 0.1 mg/dL (ref 0.0–0.2)
Globulin: 2.3 g/dL (calc) (ref 1.9–3.7)
Indirect Bilirubin: 0.4 mg/dL (calc) (ref 0.2–1.2)
Total Bilirubin: 0.5 mg/dL (ref 0.2–1.2)
Total Protein: 6.3 g/dL (ref 6.1–8.1)

## 2020-05-26 ENCOUNTER — Encounter: Payer: Self-pay | Admitting: Family Medicine

## 2020-05-26 ENCOUNTER — Telehealth (INDEPENDENT_AMBULATORY_CARE_PROVIDER_SITE_OTHER): Payer: 59 | Admitting: Family Medicine

## 2020-05-26 DIAGNOSIS — M542 Cervicalgia: Secondary | ICD-10-CM | POA: Diagnosis not present

## 2020-05-26 DIAGNOSIS — G44209 Tension-type headache, unspecified, not intractable: Secondary | ICD-10-CM

## 2020-05-26 MED ORDER — SUMATRIPTAN SUCCINATE 25 MG PO TABS: 25.0000 mg | ORAL_TABLET | Freq: Once | ORAL | 0 refills | Status: DC

## 2020-05-26 MED ORDER — METHOCARBAMOL 500 MG PO TABS: 500.0000 mg | ORAL_TABLET | Freq: Three times a day (TID) | ORAL | 0 refills | Status: DC | PRN

## 2020-05-26 NOTE — Progress Notes (Signed)
Virtual Visit via Telephone Note  I connected with Sabrina [name] on 05/26/20 at 12:40 PM by telephone and verified that I am speaking with the correct person using two identifiers. Sabrina [name] is currently located at home and nobody is currently with her during this visit. The provider, Loman Brooklyn, FNP is located in their office at time of visit.  I discussed the limitations, risks, security and privacy concerns of performing an evaluation and management service by telephone and the availability of in person appointments. I also discussed with the patient that there may be a patient responsible charge related to this service. The patient expressed understanding and agreed to proceed.  Subjective: PCP: [name], FNP  Chief Complaint  Patient presents with  . Headache  . Knot on back of head   Patient reports a knot of muscle above her hairline at the back of her head with a headache. She reports this happened before last year, but once the headache resolved, it did not return until now. She states the knot never went completely away but "went way down". On Saturday the headache started and she felt the area at the back of her head and noticed it was "elevated again". She denies any heavy lifting or unusual activity. She has been under a lot of stress recently. In addition she reports last night when she leaned over on her elbow and went to sit back up, there was a pain that felt like a shock go down her back. It was fleeting and has not reoccurred. She has been rotating Tylenol and Motrin for two days which did not resolve the headache until yesterday evening. She reports she has not had a headache today but does still feel the discomfort at the back of her head.    ROS: Per HPI  Current Outpatient Medications:  .  amLODipine (NORVASC) 5 MG tablet, Take 1 tablet (5 mg total) by mouth daily. (Patient taking differently: Take 5 mg by mouth at bedtime. ), Disp: 90 tablet,  Rfl: 4 .  hydrochlorothiazide (HYDRODIURIL) 12.5 MG tablet, Take 1 tablet (12.5 mg total) by mouth daily. (Patient taking differently: Take 12.5 mg by mouth daily as needed (fluid). ), Disp: 90 tablet, Rfl: 3 .  omeprazole (PRILOSEC) 20 MG capsule, Take 20 mg by mouth in the morning and at bedtime. , Disp: , Rfl:  .  ondansetron (ZOFRAN-ODT) 4 MG disintegrating tablet, Take 1 tablet (4 mg total) by mouth every 8 (eight) hours as needed for nausea or vomiting., Disp: 20 tablet, Rfl: 0 .  senna (SENOKOT) 8.6 MG TABS tablet, Take 1 tablet by mouth daily as needed for mild constipation., Disp: , Rfl:  .  Vitamin D, Ergocalciferol, (DRISDOL) 1.25 MG (50000 UT) CAPS capsule, Take 1 capsule (50,000 Units total) by mouth once a week. (Patient taking differently: Take 50,000 Units by mouth every Sunday. ), Disp: 12 capsule, Rfl: 3  No Known Allergies Past Medical History:  Diagnosis Date  . GERD (gastroesophageal reflux disease)   . Hypertension     Observations/Objective: A&O  No respiratory distress or wheezing audible over the phone Mood, judgement, and thought processes all WNL   Assessment and Plan: 1. Tension headache - Encouraged to take Ibuprofen 400 mg with Tylenol 500 mg for pain relief.  - methocarbamol (ROBAXIN) 500 MG tablet; Take 1 tablet (500 mg total) by mouth 3 (three) times daily as needed for muscle spasms.  Dispense: 30 tablet; Refill: 0 - SUMAtriptan (IMITREX) 25  MG tablet; Take 1 tablet (25 mg total) by mouth once for 1 dose. May repeat in 2 hours if headache persists or recurs.  Dispense: 10 tablet; Refill: 0  2. Cervical muscle pain - methocarbamol (ROBAXIN) 500 MG tablet; Take 1 tablet (500 mg total) by mouth 3 (three) times daily as needed for muscle spasms.  Dispense: 30 tablet; Refill: 0   Follow Up Instructions:  I discussed the assessment and treatment plan with the patient. The patient was provided an opportunity to ask questions and all were answered. The  patient agreed with the plan and demonstrated an understanding of the instructions.   The patient was advised to call back or seek an in-person evaluation if the symptoms worsen or if the condition fails to improve as anticipated.  The above assessment and management plan was discussed with the patient. The patient verbalized understanding of and has agreed to the management plan. Patient is aware to call the clinic if symptoms persist or worsen. Patient is aware when to return to the clinic for a follow-up visit. Patient educated on when it is appropriate to go to the emergency department.   Time call ended: 1:01 PM  I provided 23 minutes of non-face-to-face time during this encounter.  Hendricks Limes, MSN, APRN, FNP-C [facility] 05/26/20

## 2020-06-18 ENCOUNTER — Other Ambulatory Visit: Payer: Self-pay | Admitting: Family

## 2020-06-18 DIAGNOSIS — I10 Essential (primary) hypertension: Secondary | ICD-10-CM

## 2020-07-09 ENCOUNTER — Encounter: Payer: Self-pay | Admitting: Nurse Practitioner

## 2020-07-09 ENCOUNTER — Other Ambulatory Visit: Payer: Self-pay

## 2020-07-09 ENCOUNTER — Ambulatory Visit (INDEPENDENT_AMBULATORY_CARE_PROVIDER_SITE_OTHER): Payer: 59 | Admitting: Nurse Practitioner

## 2020-07-09 VITALS — BP 134/96 | HR 75 | Temp 97.9°F | Resp 20 | Ht 66.0 in | Wt 209.0 lb

## 2020-07-09 DIAGNOSIS — M546 Pain in thoracic spine: Secondary | ICD-10-CM

## 2020-07-09 MED ORDER — KETOROLAC TROMETHAMINE 60 MG/2ML IM SOLN
60.0000 mg | Freq: Once | INTRAMUSCULAR | Status: AC
  Administered 2020-07-09: 60 mg via INTRAMUSCULAR

## 2020-07-09 MED ORDER — PREDNISONE 10 MG (21) PO TBPK: ORAL_TABLET | ORAL | 0 refills | Status: DC

## 2020-07-09 NOTE — Progress Notes (Signed)
Subjective:    Patient ID: Sabrina [name], female    DOB: 19-Jan-1971, 49 y.o.   MRN: [id number]   Chief Complaint: Spot on back of head (Pain in upper back and shoulders and causes headaches)   HPI Pt here today for pain in her back/shoulders and headaches. Says pain starts in her back in the spinal area and tightness goes up to a knot on the back of her head and gives her headaches. She was seen 05/26/20 for this same issue and was given medication for migraines and a muscle relaxer. The medications do help but there is still some soreness present. She is also taking tylenol and motrin to help the pain. States the back pain also radiates down her  shoulders, usually just the right shoulder but sometimes both. There is another knot on the back of her right shoulder. This makes her arms and legs feel heavy. She has been told that these pains are coming from tension. She has been under a lot of stress lately. This pain comes and goes and lasts for several hours. This morning she had some nausea. When these headaches occur, she feels like she needs to take everything off because it feels like things are closing in on her. If she lays flat on her back that triggers a headache but she is unaware of any other triggers.  She also has intense, squeezing, constant pain down the middle of her spine every morning that lasts for about 3 minutes. She sits and relaxes and the pain will subside. This has been going on for the last 2 1/2-3 weeks.    Review of Systems  Constitutional: Negative.   HENT: Negative.   Eyes: Positive for photophobia (with headaches).  Respiratory: Negative.   Cardiovascular: Negative.   Gastrointestinal: Negative.   Endocrine: Negative.   Genitourinary: Negative.   Musculoskeletal: Positive for back pain and neck pain.       Knot in back of head and on back of right shoulder  Skin: Negative.   Neurological: Positive for headaches.  Psychiatric/Behavioral: The patient is  nervous/anxious.        Objective:   Physical Exam Vitals and nursing note reviewed.  Constitutional:      Appearance: Normal appearance.  HENT:     Head: Normocephalic.     Right Ear: Tympanic membrane normal.     Left Ear: Tympanic membrane normal.     Nose: Nose normal.     Mouth/Throat:     Mouth: Mucous membranes are moist.     Pharynx: Oropharynx is clear.  Eyes:     Pupils: Pupils are equal, round, and reactive to light.  Cardiovascular:     Rate and Rhythm: Normal rate and regular rhythm.     Heart sounds: Normal heart sounds.  Pulmonary:     Effort: Pulmonary effort is normal.  Abdominal:     Palpations: Abdomen is soft.  Musculoskeletal:     Right shoulder: Decreased range of motion (knot on back of right shoulder).     Left shoulder: Normal.     Cervical back: Decreased range of motion.  Skin:    General: Skin is warm and dry.     Capillary Refill: Capillary refill takes 2 to 3 seconds.  Neurological:     General: No focal deficit present.     Mental Status: She is alert and oriented to person, place, and time.    BP (!) 134/96   Pulse 75   Temp 97.9  F (36.6 C) (Temporal)   Resp 20   Ht 5\' 6"  (1.676 m)   Wt 209 lb (94.8 kg)   SpO2 100%   BMI 33.73 kg/m      Assessment & Plan:  Koa Palla comes in today with chief complaint of Spot on back of head (Pain in upper back and shoulders and causes headaches)   Diagnosis and orders addressed:  1. Acute midline thoracic back pain Return if symptoms do not improve Moist heat Message - ketorolac (TORADOL) injection 60 mg - predniSONE (STERAPRED UNI-PAK 21 TAB) 10 MG (21) TBPK tablet; As directed x 6 days  Dispense: 21 tablet; Refill: 0   Labs pending Health Maintenance reviewed Diet and exercise encouraged  Follow up plan: prn   Mary-Margaret Hassell Done, FNP

## 2020-07-16 ENCOUNTER — Telehealth: Payer: Self-pay

## 2020-07-16 NOTE — Telephone Encounter (Signed)
Pt says she finished taking the Prednisone yesterday and says she still has knot on head and shoulder but are a little smaller and says she is still having headaches. Wants to know what the next steps are. Still having neck and back pain as well that comes and goes, but when it comes it is painful.

## 2020-07-16 NOTE — Telephone Encounter (Signed)
Patient aware and verbalized understanding. °

## 2020-07-16 NOTE — Telephone Encounter (Signed)
Appt made

## 2020-07-16 NOTE — Telephone Encounter (Signed)
PT needs to be seen for possible x-ray given continued pain.

## 2020-07-20 ENCOUNTER — Ambulatory Visit (INDEPENDENT_AMBULATORY_CARE_PROVIDER_SITE_OTHER): Payer: 59

## 2020-07-20 ENCOUNTER — Encounter: Payer: Self-pay | Admitting: Family Medicine

## 2020-07-20 ENCOUNTER — Other Ambulatory Visit: Payer: Self-pay

## 2020-07-20 ENCOUNTER — Ambulatory Visit (INDEPENDENT_AMBULATORY_CARE_PROVIDER_SITE_OTHER): Payer: 59 | Admitting: Family Medicine

## 2020-07-20 VITALS — BP 140/89 | HR 81 | Temp 98.3°F | Ht 66.0 in | Wt 213.0 lb

## 2020-07-20 DIAGNOSIS — M542 Cervicalgia: Secondary | ICD-10-CM

## 2020-07-20 DIAGNOSIS — M546 Pain in thoracic spine: Secondary | ICD-10-CM | POA: Diagnosis not present

## 2020-07-20 DIAGNOSIS — G44209 Tension-type headache, unspecified, not intractable: Secondary | ICD-10-CM | POA: Diagnosis not present

## 2020-07-20 MED ORDER — METHOCARBAMOL 500 MG PO TABS: 500.0000 mg | ORAL_TABLET | Freq: Three times a day (TID) | ORAL | 0 refills | Status: DC | PRN

## 2020-07-20 NOTE — Progress Notes (Signed)
BP 140/89   Pulse 81   Temp 98.3 F (36.8 C)   Ht 5\' 6"  (1.676 m)   Wt 213 lb (96.6 kg)   SpO2 100%   BMI 34.38 kg/m    Subjective:   Patient ID: Sabrina [name], female    DOB: Feb 06, 1971, 49 y.o.   MRN: [id number]  HPI: Sabrina [name] is a 49 y.o. female presenting on 07/20/2020 for Back Pain (mid back, radiates to neck and shoulders)   HPI Patient is coming in complaining of neck pain and back pain.  She says the neck pain comes on both sides but it is worse on the right.  It does come sometimes down into her upper back and down and towards both shoulders and into the neck.  She has been having more issues with this.  She has had visits over the past couple months for this as well and is tried multiple things including muscle relaxers and steroids that have helped some but are not improving it.  She wants to know what else she can do.  Patient denies any numbness or weakness.  She does says does not get any better over the past couple months.  She cannot recall any specific traumatic incident or anything that brought it upon her initially.  Relevant past medical, surgical, family and social history reviewed and updated as indicated. Interim medical history since our last visit reviewed. Allergies and medications reviewed and updated.  Review of Systems  Constitutional: Negative for chills and fever.  Eyes: Negative for redness and visual disturbance.  Respiratory: Negative for chest tightness and shortness of breath.   Cardiovascular: Negative for chest pain and leg swelling.  Musculoskeletal: Positive for arthralgias, back pain and neck pain. Negative for gait problem.  Skin: Negative for rash.  Neurological: Positive for headaches. Negative for dizziness, weakness, light-headedness and numbness.  Psychiatric/Behavioral: Negative for agitation and behavioral problems.  All other systems reviewed and are negative.   Per HPI unless specifically indicated  above   Allergies as of 07/20/2020   No Known Allergies     Medication List       Accurate as of July 20, 2020  2:41 PM. If you have any questions, ask your nurse or doctor.        amLODipine 5 MG tablet Commonly known as: NORVASC Take 1 tablet by mouth once daily   hydrochlorothiazide 12.5 MG tablet Commonly known as: HYDRODIURIL Take 1 tablet (12.5 mg total) by mouth daily. What changed:   when to take this  reasons to take this   methocarbamol 500 MG tablet Commonly known as: Robaxin Take 1 tablet (500 mg total) by mouth 3 (three) times daily as needed for muscle spasms.   omeprazole 20 MG capsule Commonly known as: PRILOSEC Take 20 mg by mouth in the morning and at bedtime.   ondansetron 4 MG disintegrating tablet Commonly known as: ZOFRAN-ODT Take 1 tablet (4 mg total) by mouth every 8 (eight) hours as needed for nausea or vomiting.   predniSONE 10 MG (21) Tbpk tablet Commonly known as: STERAPRED UNI-PAK 21 TAB As directed x 6 days   senna 8.6 MG Tabs tablet Commonly known as: SENOKOT Take 1 tablet by mouth daily as needed for mild constipation.   SUMAtriptan 25 MG tablet Commonly known as: Imitrex Take 1 tablet (25 mg total) by mouth once for 1 dose. May repeat in 2 hours if headache persists or recurs.   VITAMIN D PO Take by mouth.  Objective:   BP 140/89   Pulse 81   Temp 98.3 F (36.8 C)   Ht 5\' 6"  (1.676 m)   Wt 213 lb (96.6 kg)   SpO2 100%   BMI 34.38 kg/m   Wt Readings from Last 3 Encounters:  07/20/20 213 lb (96.6 kg)  07/09/20 209 lb (94.8 kg)  01/14/20 212 lb 3.2 oz (96.3 kg)    Physical Exam Vitals and nursing note reviewed.  Constitutional:      General: She is not in acute distress.    Appearance: She is well-developed. She is not diaphoretic.  Eyes:     Conjunctiva/sclera: Conjunctivae normal.  Musculoskeletal:        General: Tenderness (Bilateral neck pain muscular tenderness and upper thoracic muscular  tenderness, worse on the right.) present.  Skin:    General: Skin is warm and dry.     Findings: No rash.  Neurological:     Mental Status: She is alert and oriented to person, place, and time.     Coordination: Coordination normal.  Psychiatric:        Behavior: Behavior normal.     Cervical spine and thoracic spine x-ray: Await final read from radiology  Assessment & Plan:   Problem List Items Addressed This Visit    None    Visit Diagnoses    Neck pain    -  Primary   Relevant Orders   DG Cervical Spine Complete (Completed)   DG Thoracic Spine 2 View (Completed)   Ambulatory referral to Physical Therapy   Acute midline thoracic back pain       Relevant Medications   methocarbamol (ROBAXIN) 500 MG tablet   Other Relevant Orders   DG Cervical Spine Complete (Completed)   DG Thoracic Spine 2 View (Completed)   Ambulatory referral to Physical Therapy   Tension headache       Relevant Medications   methocarbamol (ROBAXIN) 500 MG tablet   Other Relevant Orders   Ambulatory referral to Physical Therapy   Cervical muscle pain       Relevant Medications   methocarbamol (ROBAXIN) 500 MG tablet   Other Relevant Orders   Ambulatory referral to Physical Therapy      Continue muscle relaxer, will do x-rays and physical therapy referral Follow up plan: Return if symptoms worsen or fail to improve.  Counseling provided for all of the vaccine components No orders of the defined types were placed in this encounter.   Caryl Pina, MD [facility] 07/20/2020, 2:41 PM

## 2020-07-21 ENCOUNTER — Telehealth: Payer: Self-pay

## 2020-07-21 NOTE — Telephone Encounter (Signed)
Pt called to get xray results.   Please review and call pt to give her results.

## 2020-07-22 NOTE — Telephone Encounter (Signed)
Patient aware and verbalizes understanding. 

## 2020-07-22 NOTE — Telephone Encounter (Signed)
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  Do not know if this is the cause of her pain or not but will see if persists we may have to do further imaging in the future.  Continue stretching and exercises and anti-inflammatories.

## 2020-07-22 NOTE — Progress Notes (Signed)
Patient notified

## 2020-07-23 ENCOUNTER — Other Ambulatory Visit: Payer: Self-pay | Admitting: Family Medicine

## 2020-07-23 ENCOUNTER — Other Ambulatory Visit: Payer: Self-pay | Admitting: Family

## 2020-07-23 DIAGNOSIS — G44209 Tension-type headache, unspecified, not intractable: Secondary | ICD-10-CM

## 2020-07-23 DIAGNOSIS — I10 Essential (primary) hypertension: Secondary | ICD-10-CM

## 2020-07-28 ENCOUNTER — Other Ambulatory Visit: Payer: Self-pay

## 2020-07-28 ENCOUNTER — Encounter: Payer: Self-pay | Admitting: Physical Therapy

## 2020-07-28 ENCOUNTER — Ambulatory Visit: Payer: 59 | Attending: Family Medicine | Admitting: Physical Therapy

## 2020-07-28 DIAGNOSIS — M542 Cervicalgia: Secondary | ICD-10-CM | POA: Insufficient documentation

## 2020-07-28 DIAGNOSIS — M546 Pain in thoracic spine: Secondary | ICD-10-CM | POA: Insufficient documentation

## 2020-07-28 DIAGNOSIS — R293 Abnormal posture: Secondary | ICD-10-CM | POA: Diagnosis present

## 2020-07-28 NOTE — Therapy (Signed)
[facility] [place], [place], [address] Phone: [phone]   Fax:  [phone]  Physical Therapy Evaluation  Patient Details  Name: Sabrina [name] MRN: [id number] Date of Birth: 1971/03/06 Referring Provider (PT): Caryl Pina, MD   Encounter Date: 07/28/2020   PT End of Session - 07/28/20 1735    Visit Number 1    Number of Visits 12    Date for PT Re-Evaluation 09/15/20    Authorization Type FOTO (44% at IE), progress note every 10th visit    PT Start Time 1515    PT Stop Time 1600    PT Time Calculation (min) 45 min    Activity Tolerance Patient tolerated treatment well    Behavior During Therapy [facility] for tasks assessed/performed           Past Medical History:  Diagnosis Date  . GERD (gastroesophageal reflux disease)   . Hypertension     Past Surgical History:  Procedure Laterality Date  . CESAREAN SECTION    . CHOLECYSTECTOMY N/A 12/25/2019   Procedure: LAPAROSCOPIC CHOLECYSTECTOMY WITH INTRAOPERATIVE CHOLANGIOGRAM;  Surgeon: Virl Cagey, MD;  Location: [facility];  Service: General;  Laterality: N/A;  . COLONOSCOPY N/A 12/04/2019    sigmoid diverticulosis.  Marland Kitchen ECTOPIC PREGNANCY SURGERY    . ESOPHAGOGASTRODUODENOSCOPY N/A 12/04/2019   normal esophagus, stomach, and duodenum.     There were no vitals filed for this visit.    Subjective Assessment - 07/28/20 1724    Subjective COVID-19 screening performed upon arrival. Patient arrives to physical therapy with reports of upper thoracic pain that radiates up the cervical spine and down R UT and shoulder region. Patient reports having a history of back and neck injuries and this exacerbation began about August 2021. Patient reports ability to perform ADLs independently but requires some assistance for home activities and tasks. Patient reports pain can cause headaches and difficulties with sleeping. Patient reports taking muscle relaxers helps alleviate the  pain and helps with sleeping at night. Patient reports pain at worst as 10/10 and pain at best as 2/10. Patient's goals are to decrease pain, improve sleeping, and stand and walk longer.    Pertinent History HTN, GERD    How long can you stand comfortably? 1 hr    How long can you walk comfortably? 1 hr    Diagnostic tests see imaging for X-ray results    Patient Stated Goals decrease pain    Currently in Pain? Yes    Pain Score 3     Pain Location Thoracic    Pain Orientation Mid    Pain Descriptors / Indicators Sore    Pain Type Chronic pain    Pain Radiating Towards up cervical spine and to R UT    Pain Onset More than a month ago    Pain Frequency Constant    Aggravating Factors  stand for a long time, long days,    Pain Relieving Factors meds, rest, heat    Effect of Pain on Daily Activities "at times must take rest breaks"              [facility] PT Assessment - 07/28/20 0001      Assessment   Medical Diagnosis Neck pain, Acute midline thoracic back pain, Tension headache, Cervical muscle pain    Referring Provider (PT) Caryl Pina, MD    Onset Date/Surgical Date --   exacerbation 05/2020   Next MD Visit 08/24/2020    Prior Therapy yes  Precautions   Precautions None      Restrictions   Weight Bearing Restrictions No      Balance Screen   Has the patient fallen in the past 6 months No    Has the patient had a decrease in activity level because of a fear of falling?  No    Is the patient reluctant to leave their home because of a fear of falling?  No      Home Environment   Living Environment Private residence      Prior Function   Level of Independence Independent      Observation/Other Assessments   Focus on Therapeutic Outcomes (FOTO)  44% limitation      Posture/Postural Control   Posture/Postural Control Postural limitations    Postural Limitations Rounded Shoulders;Forward head;Decreased lumbar lordosis      ROM / Strength   AROM / PROM /  Strength AROM;Strength      AROM   AROM Assessment Site Cervical    Cervical Flexion 38    Cervical Extension 51   (+) pulling    Cervical - Right Side Bend 33    Cervical - Left Side Bend 31    Cervical - Right Rotation 57    Cervical - Left Rotation 62      Strength   Strength Assessment Site Shoulder    Right/Left Shoulder Right;Left    Right Shoulder Flexion 4/5    Right Shoulder ABduction 4/5    Right Shoulder Internal Rotation 4+/5    Right Shoulder External Rotation 4+/5    Left Shoulder Flexion 4/5    Left Shoulder ABduction 4/5    Left Shoulder Internal Rotation 4+/5    Left Shoulder External Rotation 4+/5                      Objective measurements completed on examination: See above findings.               PT Education - 07/28/20 1734    Education Details chi tuck, chin tuck with extension, UT stretch, levator scap stretch    Person(s) Educated Patient    Methods Explanation;Demonstration;Handout    Comprehension Verbalized understanding;Returned demonstration               PT Long Term Goals - 07/28/20 1740      PT LONG TERM GOAL #1   Title Patient will be independent with HEP.    Time 6    Period Weeks    Status New      PT LONG TERM GOAL #2   Title Patient will demonstrate 4+5 or greater bilateral shoulder MMT to improve stability during functional tasks.    Time 6    Period Weeks    Status New      PT LONG TERM GOAL #3   Title Patient will demonstrate 65+ degrees of cervical rotation AROM to improve ability to scan environment.    Time 6    Period Weeks    Status New      PT LONG TERM GOAL #4   Title Patient will report ability to stand and walk for 2 hours with thoracic and cervical pain less than or equal to 3/10.    Time 6    Period Weeks    Status New      PT LONG TERM GOAL #5   Title Patient will report ability to perform ADLs and home activities with thoracic and cervical pain less  than or equal to 3/10.      Time 6    Period Weeks    Status New                  Plan - 07/28/20 1736    Clinical Impression Statement Patient is a 49 year old female who presents to physical therapy with thoracic and cervical spine pain, decreased bilateral UT MMT, and abnormal posture. Patient noted with tenderness to palpation to R UT, suboccipitials and L mid thoracic paraspinals. Patient noted with increased tone in R UT and cervicothoracic paraspinals. Patient noted with forward head, rounded shoulders, and decreased lumbar lordosis. Patient and PT disucssed HEP and plan of care to which patient reported understanding. Patient would benefit from skilled physical therapy to address deficits and address patient's goals.    Personal Factors and Comorbidities Age;Time since onset of injury/illness/exacerbation;Comorbidity 1;Comorbidity 2    Comorbidities HTN, GERD    Examination-Activity Limitations Sleep;Stand;Lift    Examination-Participation Restrictions Meal Prep;Cleaning    Stability/Clinical Decision Making Stable/Uncomplicated    Clinical Decision Making Low    Rehab Potential Good    PT Frequency 2x / week    PT Duration 6 weeks    PT Treatment/Interventions ADLs/Self Care Home Management;Cryotherapy;Electrical Stimulation;Iontophoresis 4mg /ml Dexamethasone;Moist Heat;Traction;Ultrasound;Neuromuscular re-education;Therapeutic exercise;Therapeutic activities;Functional mobility training;Patient/family education;Passive range of motion;Dry needling;Manual techniques    PT Next Visit Plan UBE, postural exercises, chin tucks, cervical ROM, cervical and shoulder strengthening, modalities PRN for pain relief    PT Home Exercise Plan see patient education section    Consulted and Agree with Plan of Care Patient           Patient will benefit from skilled therapeutic intervention in order to improve the following deficits and impairments:  Decreased activity tolerance, Decreased strength, Decreased range  of motion, Difficulty walking, Pain, Postural dysfunction  Visit Diagnosis: Cervicalgia - Plan: PT plan of care cert/re-cert  Pain in thoracic spine - Plan: PT plan of care cert/re-cert  Abnormal posture - Plan: PT plan of care cert/re-cert     Problem List Patient Active Problem List   Diagnosis Date Noted  . Abdominal pain 01/14/2020  . Common bile duct dilation   . Calculus of gallbladder without cholecystitis without obstruction 12/17/2019  . Dyspepsia 11/26/2019  . Encounter for screening colonoscopy 11/26/2019  . Hyperlipemia 05/01/2018  . Morbid obesity ([place]) 05/01/2018  . GERD (gastroesophageal reflux disease) 02/22/2017  . Vitamin D deficiency 04/25/2016  . Essential hypertension, benign 07/05/2013    Gabriela Eves, PT, DPT 07/28/2020, 5:50 PM  [facility] [address] [place], [place], [address] Phone: [phone]   Fax:  [phone]  Name: Sabrina [name] MRN: [id number] Date of Birth: 02-25-1971

## 2020-07-30 ENCOUNTER — Encounter: Payer: Self-pay | Admitting: Physical Therapy

## 2020-07-30 ENCOUNTER — Ambulatory Visit: Payer: 59 | Admitting: Physical Therapy

## 2020-07-30 DIAGNOSIS — M542 Cervicalgia: Secondary | ICD-10-CM | POA: Diagnosis not present

## 2020-07-30 DIAGNOSIS — M546 Pain in thoracic spine: Secondary | ICD-10-CM

## 2020-07-30 DIAGNOSIS — R293 Abnormal posture: Secondary | ICD-10-CM

## 2020-07-30 NOTE — Therapy (Signed)
[facility] [place], [place], [address] Phone: [phone]   Fax:  [phone]  Physical Therapy Treatment  Patient Details  Name: Sabrina [name] MRN: [id number] Date of Birth: Apr 18, 1971 Referring Provider (PT): Caryl Pina, MD   Encounter Date: 07/30/2020   PT End of Session - 07/30/20 1653    Visit Number 2    Number of Visits 12    Date for PT Re-Evaluation 09/15/20    Authorization Type FOTO (44% at IE), progress note every 10th visit    PT Start Time 1653    PT Stop Time 1729    PT Time Calculation (min) 36 min    Activity Tolerance Patient tolerated treatment well    Behavior During Therapy [facility] for tasks assessed/performed           Past Medical History:  Diagnosis Date  . GERD (gastroesophageal reflux disease)   . Hypertension     Past Surgical History:  Procedure Laterality Date  . CESAREAN SECTION    . CHOLECYSTECTOMY N/A 12/25/2019   Procedure: LAPAROSCOPIC CHOLECYSTECTOMY WITH INTRAOPERATIVE CHOLANGIOGRAM;  Surgeon: Virl Cagey, MD;  Location: [facility];  Service: General;  Laterality: N/A;  . COLONOSCOPY N/A 12/04/2019    sigmoid diverticulosis.  Marland Kitchen ECTOPIC PREGNANCY SURGERY    . ESOPHAGOGASTRODUODENOSCOPY N/A 12/04/2019   normal esophagus, stomach, and duodenum.     There were no vitals filed for this visit.   Subjective Assessment - 07/30/20 1651    Subjective COVID 19 screening performed on patient upon arrival. Reports more mid back pain but has history of back issues.    Pertinent History HTN, GERD    How long can you stand comfortably? 1 hr    How long can you walk comfortably? 1 hr    Diagnostic tests see imaging for X-ray results    Patient Stated Goals decrease pain    Currently in Pain? Yes    Pain Location Thoracic    Pain Orientation Mid    Pain Descriptors / Indicators Tightness    Pain Type Chronic pain    Pain Onset More than a month ago    Pain Frequency Constant               [facility] PT Assessment - 07/30/20 0001      Assessment   Medical Diagnosis Neck pain, Acute midline thoracic back pain, Tension headache, Cervical muscle pain    Referring Provider (PT) Caryl Pina, MD    Next MD Visit 08/24/2020    Prior Therapy yes      Precautions   Precautions None      Restrictions   Weight Bearing Restrictions No                         [facility] Adult PT Treatment/Exercise - 07/30/20 0001      Exercises   Exercises Shoulder;Neck      Shoulder Exercises: Standing   Horizontal ABduction Strengthening;Both;12 reps;Theraband    Theraband Level (Shoulder Horizontal ABduction) Level 2 (Red)    ABduction Strengthening;Both;12 reps;Weights    Shoulder ABduction Weight (lbs) 2    Row Strengthening;Both;20 reps;Limitations    Row Limitations Blue XTS    Other Standing Exercises B shoulder scaption 2# x15 reps      Shoulder Exercises: ROM/Strengthening   UBE (Upper Arm Bike) 90 RPM x6 min (forward/backward)    Wall Pushups 20 reps    "W" Arms x15 reps  X to V Arms x15 reps      Modalities   Modalities Electrical Stimulation;Moist Heat      Moist Heat Therapy   Number Minutes Moist Heat 10 Minutes    Moist Heat Location Shoulder      Electrical Stimulation   Electrical Stimulation Location L mid scap, inferior scapula    Electrical Stimulation Action Pre-Mod    Electrical Stimulation Parameters 80-150 hz x10 min    Electrical Stimulation Goals Pain                       PT Long Term Goals - 07/28/20 1740      PT LONG TERM GOAL #1   Title Patient will be independent with HEP.    Time 6    Period Weeks    Status New      PT LONG TERM GOAL #2   Title Patient will demonstrate 4+5 or greater bilateral shoulder MMT to improve stability during functional tasks.    Time 6    Period Weeks    Status New      PT LONG TERM GOAL #3   Title Patient will demonstrate 65+ degrees of cervical rotation AROM to  improve ability to scan environment.    Time 6    Period Weeks    Status New      PT LONG TERM GOAL #4   Title Patient will report ability to stand and walk for 2 hours with thoracic and cervical pain less than or equal to 3/10.    Time 6    Period Weeks    Status New      PT LONG TERM GOAL #5   Title Patient will report ability to perform ADLs and home activities with thoracic and cervical pain less than or equal to 3/10.    Time 6    Period Weeks    Status New                 Plan - 07/30/20 1724    Clinical Impression Statement Patient presented in clinic with reports of tightness of mid scapular region. 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 Patient does sit with pillow in lumbar curvature. Patient progressed through postural strengthening exercises. Towel folded and placed behind patient's head to maintain cervical posture and training. Patient encouraged to try completing more scapular squeeze while sitting for work. Patient able to complete all therex but limited by UE fatigue. Normal modalities response noted following removal of the modalities.    Personal Factors and Comorbidities Age;Time since onset of injury/illness/exacerbation;Comorbidity 1;Comorbidity 2    Comorbidities HTN, GERD    Examination-Activity Limitations Sleep;Stand;Lift    Examination-Participation Restrictions Meal Prep;Cleaning    Stability/Clinical Decision Making Stable/Uncomplicated    Rehab Potential Good    PT Frequency 2x / week    PT Duration 6 weeks    PT Treatment/Interventions ADLs/Self Care Home Management;Cryotherapy;Electrical Stimulation;Iontophoresis 4mg /ml Dexamethasone;Moist Heat;Traction;Ultrasound;Neuromuscular re-education;Therapeutic exercise;Therapeutic activities;Functional mobility training;Patient/family education;Passive range of motion;Dry needling;Manual techniques    PT Next Visit Plan UBE, postural exercises, chin tucks, cervical  ROM, cervical and shoulder strengthening, modalities PRN for pain relief    PT Home Exercise Plan see patient education section    Consulted and Agree with Plan of Care Patient           Patient will benefit from skilled therapeutic intervention in order to improve the following deficits and impairments:  Decreased activity tolerance, Decreased strength, Decreased range of motion, Difficulty walking, Pain, Postural dysfunction  Visit Diagnosis: Cervicalgia  Pain in thoracic spine  Abnormal posture     Problem List Patient Active Problem List   Diagnosis Date Noted  . Abdominal pain 01/14/2020  . Common bile duct dilation   . Calculus of gallbladder without cholecystitis without obstruction 12/17/2019  . Dyspepsia 11/26/2019  . Encounter for screening colonoscopy 11/26/2019  . Hyperlipemia 05/01/2018  . Morbid obesity ([place]) 05/01/2018  . GERD (gastroesophageal reflux disease) 02/22/2017  . Vitamin D deficiency 04/25/2016  . Essential hypertension, benign 07/05/2013    Standley Brooking, PTA 07/30/2020, 5:31 PM  [facility] [address] [place], [place], [address] Phone: [phone]   Fax:  [phone]  Name: Sabrina [name] MRN: [id number] Date of Birth: 1971/07/26

## 2020-08-04 ENCOUNTER — Other Ambulatory Visit: Payer: Self-pay

## 2020-08-04 ENCOUNTER — Ambulatory Visit: Payer: 59 | Attending: Family Medicine | Admitting: Physical Therapy

## 2020-08-04 ENCOUNTER — Encounter: Payer: Self-pay | Admitting: Physical Therapy

## 2020-08-04 DIAGNOSIS — R293 Abnormal posture: Secondary | ICD-10-CM | POA: Insufficient documentation

## 2020-08-04 DIAGNOSIS — M542 Cervicalgia: Secondary | ICD-10-CM | POA: Insufficient documentation

## 2020-08-04 DIAGNOSIS — M546 Pain in thoracic spine: Secondary | ICD-10-CM | POA: Diagnosis present

## 2020-08-04 NOTE — Therapy (Signed)
[facility] [place], [place], [address] Phone: [phone]   Fax:  [phone]  Physical Therapy Treatment  Patient Details  Name: Sabrina [name] MRN: [id number] Date of Birth: 10/06/1970 Referring Provider (PT): Caryl Pina, MD   Encounter Date: 08/04/2020   PT End of Session - 08/04/20 1639    Visit Number 3    Number of Visits 12    Date for PT Re-Evaluation 09/15/20    Authorization Type FOTO (44% at IE), progress note every 10th visit    PT Start Time 1640    PT Stop Time 1730    PT Time Calculation (min) 50 min    Activity Tolerance Patient tolerated treatment well    Behavior During Therapy [facility] for tasks assessed/performed           Past Medical History:  Diagnosis Date  . GERD (gastroesophageal reflux disease)   . Hypertension     Past Surgical History:  Procedure Laterality Date  . CESAREAN SECTION    . CHOLECYSTECTOMY N/A 12/25/2019   Procedure: LAPAROSCOPIC CHOLECYSTECTOMY WITH INTRAOPERATIVE CHOLANGIOGRAM;  Surgeon: Virl Cagey, MD;  Location: [facility];  Service: General;  Laterality: N/A;  . COLONOSCOPY N/A 12/04/2019    sigmoid diverticulosis.  Marland Kitchen ECTOPIC PREGNANCY SURGERY    . ESOPHAGOGASTRODUODENOSCOPY N/A 12/04/2019   normal esophagus, stomach, and duodenum.     There were no vitals filed for this visit.   Subjective Assessment - 08/04/20 1741    Subjective COVID 19 screening performed on patient upon arrival. Reports 6/10 mid back pain on left side.    Pertinent History HTN, GERD    How long can you stand comfortably? 1 hr    How long can you walk comfortably? 1 hr    Diagnostic tests see imaging for X-ray results    Patient Stated Goals decrease pain    Currently in Pain? Yes    Pain Score 6     Pain Location Thoracic    Pain Orientation Left;Mid    Pain Descriptors / Indicators Sharp    Pain Type Chronic pain    Pain Onset More than a month ago    Pain Frequency Constant               [facility] PT Assessment - 08/04/20 0001      Assessment   Medical Diagnosis Neck pain, Acute midline thoracic back pain, Tension headache, Cervical muscle pain    Referring Provider (PT) Caryl Pina, MD    Next MD Visit 08/24/2020    Prior Therapy yes      Precautions   Precautions None                         [facility] Adult PT Treatment/Exercise - 08/04/20 0001      Neck Exercises: Standing   Neck Retraction 15 reps;3 secs      Neck Exercises: Seated   Other Seated Exercise thoracic extension 3x10 with towel in 3 different positions      Shoulder Exercises: Standing   Extension Strengthening;Both    Extension Limitations Blue XTS    Row Strengthening;Both;20 reps;Limitations    Row Limitations Blue XTS      Shoulder Exercises: ROM/Strengthening   UBE (Upper Arm Bike) 90 RPM x8 min (forward/backward)    "W" Arms x15 reps    X to V Arms x15 reps      Modalities   Modalities Electrical Stimulation;Moist Heat  Moist Heat Therapy   Number Minutes Moist Heat 10 Minutes    Moist Heat Location Shoulder      Electrical Stimulation   Electrical Stimulation Location inferior scap    Electrical Stimulation Action pre-mod    Electrical Stimulation Parameters 80-150 hz x10 mins    Electrical Stimulation Goals Pain      Manual Therapy   Manual Therapy Joint mobilization    Joint Mobilization mid thoracic PA mobilization  and costoverterbral joint mobs to improve ROM.                       PT Long Term Goals - 07/28/20 1740      PT LONG TERM GOAL #1   Title Patient will be independent with HEP.    Time 6    Period Weeks    Status New      PT LONG TERM GOAL #2   Title Patient will demonstrate 4+5 or greater bilateral shoulder MMT to improve stability during functional tasks.    Time 6    Period Weeks    Status New      PT LONG TERM GOAL #3   Title Patient will demonstrate 65+ degrees of cervical rotation AROM to improve  ability to scan environment.    Time 6    Period Weeks    Status New      PT LONG TERM GOAL #4   Title Patient will report ability to stand and walk for 2 hours with thoracic and cervical pain less than or equal to 3/10.    Time 6    Period Weeks    Status New      PT LONG TERM GOAL #5   Title Patient will report ability to perform ADLs and home activities with thoracic and cervical pain less than or equal to 3/10.    Time 6    Period Weeks    Status New                 Plan - 08/04/20 1731    Clinical Impression Statement Patient responded well to therapy session with no reports of incresed pain. Patient demonstrated excellent form and pacing for all TEs but reported fatigue with prone I T Y exercises. Patient instructed changing monitors from left to right weekly as she mainly uses left monitor. Patient and PT discussed basic work ergonomics to allow environment to work for her. Patient reported understanding. Decreased PA thoracic mobility with joint mobs but no reports of increased pain. Normal response to modalities upon removal.    Personal Factors and Comorbidities Age;Time since onset of injury/illness/exacerbation;Comorbidity 1;Comorbidity 2    Comorbidities HTN, GERD    Examination-Activity Limitations Sleep;Stand;Lift    Examination-Participation Restrictions Meal Prep;Cleaning    Stability/Clinical Decision Making Stable/Uncomplicated    Clinical Decision Making Low    Rehab Potential Good    PT Frequency 2x / week    PT Duration 6 weeks    PT Treatment/Interventions ADLs/Self Care Home Management;Cryotherapy;Electrical Stimulation;Iontophoresis 4mg /ml Dexamethasone;Moist Heat;Traction;Ultrasound;Neuromuscular re-education;Therapeutic exercise;Therapeutic activities;Functional mobility training;Patient/family education;Passive range of motion;Dry needling;Manual techniques    PT Next Visit Plan UBE, postural exercises, chin tucks, cervical ROM, cervical and shoulder  strengthening, joint mobs for increased mobilization; modalities PRN for pain relief    PT Home Exercise Plan see patient education section    Consulted and Agree with Plan of Care Patient           Patient will benefit from skilled  therapeutic intervention in order to improve the following deficits and impairments:  Decreased activity tolerance, Decreased strength, Decreased range of motion, Difficulty walking, Pain, Postural dysfunction  Visit Diagnosis: Cervicalgia  Pain in thoracic spine  Abnormal posture     Problem List Patient Active Problem List   Diagnosis Date Noted  . Abdominal pain 01/14/2020  . Common bile duct dilation   . Calculus of gallbladder without cholecystitis without obstruction 12/17/2019  . Dyspepsia 11/26/2019  . Encounter for screening colonoscopy 11/26/2019  . Hyperlipemia 05/01/2018  . Morbid obesity ([place]) 05/01/2018  . GERD (gastroesophageal reflux disease) 02/22/2017  . Vitamin D deficiency 04/25/2016  . Essential hypertension, benign 07/05/2013    Gabriela Eves, PT, DPT 08/04/2020, 5:42 PM  [facility] [place], [place], [address] Phone: [phone]   Fax:  [phone]  Name: Daneille Desilva MRN: [id number] Date of Birth: 07/04/1971

## 2020-08-06 ENCOUNTER — Other Ambulatory Visit: Payer: Self-pay

## 2020-08-06 ENCOUNTER — Ambulatory Visit: Payer: 59 | Admitting: Physical Therapy

## 2020-08-06 DIAGNOSIS — M546 Pain in thoracic spine: Secondary | ICD-10-CM

## 2020-08-06 DIAGNOSIS — M542 Cervicalgia: Secondary | ICD-10-CM

## 2020-08-06 DIAGNOSIS — R293 Abnormal posture: Secondary | ICD-10-CM

## 2020-08-06 NOTE — Therapy (Signed)
[facility] [place], [place], [address] Phone: [phone]   Fax:  [phone]  Physical Therapy Treatment  Patient Details  Name: Sabrina [name] MRN: [id number] Date of Birth: 1970-11-04 Referring Provider (PT): Caryl Pina, MD   Encounter Date: 08/06/2020   PT End of Session - 08/06/20 1654    Visit Number 4    Number of Visits 12    Date for PT Re-Evaluation 09/15/20    Authorization Type FOTO (44% at IE), progress note every 10th visit    PT Start Time 1651    PT Stop Time 1739    PT Time Calculation (min) 48 min    Activity Tolerance Patient tolerated treatment well    Behavior During Therapy [facility] for tasks assessed/performed           Past Medical History:  Diagnosis Date  . GERD (gastroesophageal reflux disease)   . Hypertension     Past Surgical History:  Procedure Laterality Date  . CESAREAN SECTION    . CHOLECYSTECTOMY N/A 12/25/2019   Procedure: LAPAROSCOPIC CHOLECYSTECTOMY WITH INTRAOPERATIVE CHOLANGIOGRAM;  Surgeon: Virl Cagey, MD;  Location: [facility];  Service: General;  Laterality: N/A;  . COLONOSCOPY N/A 12/04/2019    sigmoid diverticulosis.  Marland Kitchen ECTOPIC PREGNANCY SURGERY    . ESOPHAGOGASTRODUODENOSCOPY N/A 12/04/2019   normal esophagus, stomach, and duodenum.     There were no vitals filed for this visit.   Subjective Assessment - 08/06/20 1655    Subjective COVID 19 screening performed on patient upon arrival. Patient arrives with on/off pain of 2/10.    Pertinent History HTN, GERD    How long can you stand comfortably? 1 hr    How long can you walk comfortably? 1 hr    Diagnostic tests see imaging for X-ray results    Patient Stated Goals decrease pain    Currently in Pain? Yes    Pain Score 2     Pain Location Thoracic    Pain Orientation Left;Mid    Pain Descriptors / Indicators Sharp    Pain Type Chronic pain    Pain Onset More than a month ago    Pain Frequency Constant               [facility] PT Assessment - 08/06/20 0001      Assessment   Medical Diagnosis Neck pain, Acute midline thoracic back pain, Tension headache, Cervical muscle pain    Referring Provider (PT) Caryl Pina, MD    Next MD Visit 08/24/2020    Prior Therapy yes      Precautions   Precautions None                         [facility] Adult PT Treatment/Exercise - 08/06/20 0001      Exercises   Exercises Shoulder;Neck      Shoulder Exercises: Supine   Other Supine Exercises self thoracic extension over foam roll 3 different positions x10      Shoulder Exercises: Prone   Other Prone Exercises Prone I, T, Y's x20 each      Shoulder Exercises: Standing   Horizontal ABduction Strengthening;Both;Theraband;20 reps    Theraband Level (Shoulder Horizontal ABduction) Level 2 (Red)    Horizontal ABduction Limitations with chin tuck    Extension Strengthening;Both;20 reps;10 reps   2x15   Extension Limitations Blue XTS    Row Strengthening;Both;20 reps;Limitations   2x15   Row Limitations Blue XTS  Diagonals Strengthening;Both;20 reps    Diagonals Limitations Blue XTS      Shoulder Exercises: ROM/Strengthening   UBE (Upper Arm Bike) 90 RPM x8 min (forward/backward)      Modalities   Modalities Electrical Stimulation;Moist Heat      Moist Heat Therapy   Number Minutes Moist Heat 10 Minutes    Moist Heat Location Shoulder      Electrical Stimulation   Electrical Stimulation Location inferior scap    Electrical Stimulation Action pre-mod    Electrical Stimulation Parameters 80-150 hz x10 mins    Electrical Stimulation Goals Pain      Neck Exercises: Stretches   Other Neck Stretches cat camel x10                       PT Long Term Goals - 07/28/20 1740      PT LONG TERM GOAL #1   Title Patient will be independent with HEP.    Time 6    Period Weeks    Status New      PT LONG TERM GOAL #2   Title Patient will demonstrate 4+5 or greater  bilateral shoulder MMT to improve stability during functional tasks.    Time 6    Period Weeks    Status New      PT LONG TERM GOAL #3   Title Patient will demonstrate 65+ degrees of cervical rotation AROM to improve ability to scan environment.    Time 6    Period Weeks    Status New      PT LONG TERM GOAL #4   Title Patient will report ability to stand and walk for 2 hours with thoracic and cervical pain less than or equal to 3/10.    Time 6    Period Weeks    Status New      PT LONG TERM GOAL #5   Title Patient will report ability to perform ADLs and home activities with thoracic and cervical pain less than or equal to 3/10.    Time 6    Period Weeks    Status New                 Plan - 08/06/20 1714    Clinical Impression Statement Patient was able to tolerate treatment well with no reports of increased pain, just stretching. Patient's thoracic extension progressed to supine with foam roll with good response. Prone ITY's performed with good form and technique. Patient and PT discussed home TENs unit as well as self mobilizaition ball and theracane for trigger point. No adverse affects upon removal of modalities.    Personal Factors and Comorbidities Age;Time since onset of injury/illness/exacerbation;Comorbidity 1;Comorbidity 2    Comorbidities HTN, GERD    Examination-Activity Limitations Sleep;Stand;Lift    Examination-Participation Restrictions Meal Prep;Cleaning    Stability/Clinical Decision Making Stable/Uncomplicated    Clinical Decision Making Low    Rehab Potential Good    PT Frequency 2x / week    PT Duration 6 weeks    PT Treatment/Interventions ADLs/Self Care Home Management;Cryotherapy;Electrical Stimulation;Iontophoresis 4mg /ml Dexamethasone;Moist Heat;Traction;Ultrasound;Neuromuscular re-education;Therapeutic exercise;Therapeutic activities;Functional mobility training;Patient/family education;Passive range of motion;Dry needling;Manual techniques    PT  Next Visit Plan UBE, postural exercises, chin tucks, cervical ROM, cervical and shoulder strengthening, joint mobs for increased mobilization; modalities PRN for pain relief    PT Home Exercise Plan see patient education section    Consulted and Agree with Plan of Care Patient  Patient will benefit from skilled therapeutic intervention in order to improve the following deficits and impairments:  Decreased activity tolerance, Decreased strength, Decreased range of motion, Difficulty walking, Pain, Postural dysfunction  Visit Diagnosis: Cervicalgia  Pain in thoracic spine  Abnormal posture     Problem List Patient Active Problem List   Diagnosis Date Noted  . Abdominal pain 01/14/2020  . Common bile duct dilation   . Calculus of gallbladder without cholecystitis without obstruction 12/17/2019  . Dyspepsia 11/26/2019  . Encounter for screening colonoscopy 11/26/2019  . Hyperlipemia 05/01/2018  . Morbid obesity ([place]) 05/01/2018  . GERD (gastroesophageal reflux disease) 02/22/2017  . Vitamin D deficiency 04/25/2016  . Essential hypertension, benign 07/05/2013    Gabriela Eves, PT, DPT 08/06/2020, 5:41 PM  [facility] [place], [place], [address] Phone: [phone]   Fax:  [phone]  Name: Sabrina [name] MRN: [id number] Date of Birth: Feb 03, 1971

## 2020-08-11 ENCOUNTER — Encounter: Payer: Self-pay | Admitting: Physical Therapy

## 2020-08-11 ENCOUNTER — Other Ambulatory Visit: Payer: Self-pay

## 2020-08-11 ENCOUNTER — Ambulatory Visit: Payer: 59 | Admitting: Physical Therapy

## 2020-08-11 DIAGNOSIS — M542 Cervicalgia: Secondary | ICD-10-CM

## 2020-08-11 DIAGNOSIS — R293 Abnormal posture: Secondary | ICD-10-CM

## 2020-08-11 DIAGNOSIS — M546 Pain in thoracic spine: Secondary | ICD-10-CM

## 2020-08-11 NOTE — Therapy (Signed)
[facility] [place], [place], [address] Phone: [phone]   Fax:  [phone]  Physical Therapy Treatment  Patient Details  Name: June Rode MRN: [id number] Date of Birth: [birth year] Referring Provider (PT): Caryl Pina, MD   Encounter Date: 08/11/2020   PT End of Session - 08/11/20 1645    Visit Number 5    Number of Visits 12    Date for PT Re-Evaluation 09/15/20    Authorization Type FOTO (44% at IE), progress note every 10th visit    PT Start Time 1644    PT Stop Time 1732    PT Time Calculation (min) 48 min    Activity Tolerance Patient tolerated treatment well    Behavior During Therapy [facility] for tasks assessed/performed           Past Medical History:  Diagnosis Date  . GERD (gastroesophageal reflux disease)   . Hypertension     Past Surgical History:  Procedure Laterality Date  . CESAREAN SECTION    . CHOLECYSTECTOMY N/A 12/25/2019   Procedure: LAPAROSCOPIC CHOLECYSTECTOMY WITH INTRAOPERATIVE CHOLANGIOGRAM;  Surgeon: Virl Cagey, MD;  Location: [facility];  Service: General;  Laterality: N/A;  . COLONOSCOPY N/A 12/04/2019    sigmoid diverticulosis.  Marland Kitchen ECTOPIC PREGNANCY SURGERY    . ESOPHAGOGASTRODUODENOSCOPY N/A 12/04/2019   normal esophagus, stomach, and duodenum.     There were no vitals filed for this visit.   Subjective Assessment - 08/11/20 1644    Subjective COVID 19 screening performed on patient upon arrival. Patient arrives with on/off pain of 2/10.    Pertinent History HTN, GERD    How long can you stand comfortably? 1 hr    How long can you walk comfortably? 1 hr    Diagnostic tests see imaging for X-ray results    Patient Stated Goals decrease pain    Currently in Pain? Yes    Pain Score 2     Pain Location Thoracic    Pain Orientation Mid    Pain Descriptors / Indicators Discomfort    Pain Type Chronic pain    Pain Onset More than a month ago    Pain Frequency  Intermittent              [facility] PT Assessment - 08/11/20 0001      Assessment   Medical Diagnosis Neck pain, Acute midline thoracic back pain, Tension headache, Cervical muscle pain    Referring Provider (PT) Caryl Pina, MD    Next MD Visit 08/24/2020    Prior Therapy yes      Precautions   Precautions None                         [facility] Adult PT Treatment/Exercise - 08/11/20 0001      Neck Exercises: Machines for Strengthening   UBE (Upper Arm Bike) 60 RPM x8 min      Neck Exercises: Theraband   Horizontal ABduction 20 reps;Limitations    Horizontal ABduction Limitations yellow theraband      Neck Exercises: Standing   Wall Push Ups 20 reps    Upper Extremity D2 Flexion;15 reps;Theraband    Theraband Level (UE D2) Level 1 (Yellow)    UE D2 Limitations postural focus      Neck Exercises: Supine   Other Supine Exercise Thoracic extension over foam roller x10 reps 5 sec holds      Shoulder Exercises: Prone   Extension --  Extension Limitations --    Horizontal ABduction 1 --    Horizontal ABduction 1 Limitations --    Other Prone Exercises Prone over ball off edge of table  W, I, T, inverted V's x15 each      Shoulder Exercises: Standing   Extension Strengthening;Both;20 reps;10 reps    Extension Limitations Blue XTS    Row Strengthening;Both;20 reps;Limitations    Row Limitations Blue XTS      Modalities   Modalities Electrical Stimulation;Moist Heat      Moist Heat Therapy   Number Minutes Moist Heat 10 Minutes    Moist Heat Location Shoulder      Electrical Stimulation   Electrical Stimulation Location B inferior scap    Electrical Stimulation Action Pre-Mod    Electrical Stimulation Parameters 80-150 hz x10 min    Electrical Stimulation Goals Pain                       PT Long Term Goals - 07/28/20 1740      PT LONG TERM GOAL #1   Title Patient will be independent with HEP.    Time 6    Period Weeks    Status  New      PT LONG TERM GOAL #2   Title Patient will demonstrate 4+5 or greater bilateral shoulder MMT to improve stability during functional tasks.    Time 6    Period Weeks    Status New      PT LONG TERM GOAL #3   Title Patient will demonstrate 65+ degrees of cervical rotation AROM to improve ability to scan environment.    Time 6    Period Weeks    Status New      PT LONG TERM GOAL #4   Title Patient will report ability to stand and walk for 2 hours with thoracic and cervical pain less than or equal to 3/10.    Time 6    Period Weeks    Status New      PT LONG TERM GOAL #5   Title Patient will report ability to perform ADLs and home activities with thoracic and cervical pain less than or equal to 3/10.    Time 6    Period Weeks    Status New                 Plan - 08/11/20 1741    Clinical Impression Statement Patient progressed to more advanced postural exercises with no complaints of pain. 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 Patient fatigued due to strength training but no reports of increased mid back pain. Normal modalities response noted following removal of the modalities.    Personal Factors and Comorbidities Age;Time since onset of injury/illness/exacerbation;Comorbidity 1;Comorbidity 2    Comorbidities HTN, GERD    Examination-Activity Limitations Sleep;Stand;Lift    Examination-Participation Restrictions Meal Prep;Cleaning    Stability/Clinical Decision Making Stable/Uncomplicated    Rehab Potential Good    PT Frequency 2x / week    PT Duration 6 weeks    PT Treatment/Interventions ADLs/Self Care Home Management;Cryotherapy;Electrical Stimulation;Iontophoresis 4mg /ml Dexamethasone;Moist Heat;Traction;Ultrasound;Neuromuscular re-education;Therapeutic exercise;Therapeutic activities;Functional mobility training;Patient/family education;Passive range of motion;Dry needling;Manual techniques    PT Next Visit Plan UBE,  postural exercises, chin tucks, cervical ROM, cervical and shoulder strengthening, joint mobs for increased mobilization; modalities PRN for pain relief    PT Home Exercise Plan see patient education section    Consulted and Agree with  Plan of Care Patient           Patient will benefit from skilled therapeutic intervention in order to improve the following deficits and impairments:  Decreased activity tolerance, Decreased strength, Decreased range of motion, Difficulty walking, Pain, Postural dysfunction  Visit Diagnosis: Cervicalgia  Pain in thoracic spine  Abnormal posture     Problem List Patient Active Problem List   Diagnosis Date Noted  . Abdominal pain 01/14/2020  . Common bile duct dilation   . Calculus of gallbladder without cholecystitis without obstruction 12/17/2019  . Dyspepsia 11/26/2019  . Encounter for screening colonoscopy 11/26/2019  . Hyperlipemia 05/01/2018  . Morbid obesity ([place]) 05/01/2018  . GERD (gastroesophageal reflux disease) 02/22/2017  . Vitamin D deficiency 04/25/2016  . Essential hypertension, benign 07/05/2013    Standley Brooking, PTA 08/11/2020, 5:46 PM  [facility] [address] [place], [place], [address] Phone: [phone]   Fax:  [phone]  Name: Shoshanna Mcquitty MRN: [id number] Date of Birth: [birth year]

## 2020-08-13 ENCOUNTER — Encounter: Payer: Self-pay | Admitting: Physical Therapy

## 2020-08-13 ENCOUNTER — Other Ambulatory Visit: Payer: Self-pay

## 2020-08-13 ENCOUNTER — Ambulatory Visit (INDEPENDENT_AMBULATORY_CARE_PROVIDER_SITE_OTHER): Payer: 59 | Admitting: Family

## 2020-08-13 ENCOUNTER — Encounter: Payer: Self-pay | Admitting: Family

## 2020-08-13 ENCOUNTER — Ambulatory Visit: Payer: 59 | Admitting: Physical Therapy

## 2020-08-13 VITALS — BP 130/79 | HR 86 | Temp 97.8°F | Ht 66.0 in | Wt 213.2 lb

## 2020-08-13 DIAGNOSIS — Z0001 Encounter for general adult medical examination with abnormal findings: Secondary | ICD-10-CM

## 2020-08-13 DIAGNOSIS — G44209 Tension-type headache, unspecified, not intractable: Secondary | ICD-10-CM

## 2020-08-13 DIAGNOSIS — Z0000 Encounter for general adult medical examination without abnormal findings: Secondary | ICD-10-CM

## 2020-08-13 DIAGNOSIS — M542 Cervicalgia: Secondary | ICD-10-CM | POA: Diagnosis not present

## 2020-08-13 DIAGNOSIS — M546 Pain in thoracic spine: Secondary | ICD-10-CM

## 2020-08-13 DIAGNOSIS — Z789 Other specified health status: Secondary | ICD-10-CM

## 2020-08-13 DIAGNOSIS — I10 Essential (primary) hypertension: Secondary | ICD-10-CM | POA: Diagnosis not present

## 2020-08-13 DIAGNOSIS — R293 Abnormal posture: Secondary | ICD-10-CM

## 2020-08-13 DIAGNOSIS — K5900 Constipation, unspecified: Secondary | ICD-10-CM | POA: Insufficient documentation

## 2020-08-13 DIAGNOSIS — K219 Gastro-esophageal reflux disease without esophagitis: Secondary | ICD-10-CM

## 2020-08-13 DIAGNOSIS — E559 Vitamin D deficiency, unspecified: Secondary | ICD-10-CM

## 2020-08-13 DIAGNOSIS — E785 Hyperlipidemia, unspecified: Secondary | ICD-10-CM

## 2020-08-13 MED ORDER — HYDROCHLOROTHIAZIDE 12.5 MG PO TABS: 12.5000 mg | ORAL_TABLET | Freq: Every day | ORAL | 0 refills | Status: DC | PRN

## 2020-08-13 MED ORDER — METHOCARBAMOL 500 MG PO TABS: 500.0000 mg | ORAL_TABLET | Freq: Three times a day (TID) | ORAL | 0 refills | Status: DC | PRN

## 2020-08-13 MED ORDER — SUMATRIPTAN SUCCINATE 25 MG PO TABS: ORAL_TABLET | ORAL | 0 refills | Status: DC

## 2020-08-13 MED ORDER — AMLODIPINE BESYLATE 5 MG PO TABS: 5.0000 mg | ORAL_TABLET | Freq: Every day | ORAL | 3 refills | Status: DC

## 2020-08-13 MED ORDER — OMEPRAZOLE 20 MG PO CPDR: 20.0000 mg | DELAYED_RELEASE_CAPSULE | Freq: Two times a day (BID) | ORAL | 2 refills | Status: DC

## 2020-08-13 NOTE — Progress Notes (Signed)
Subjective:    Patient ID: Sabrina [name], female    DOB: [birth year], 49 y.o.   MRN: [id number]  Chief Complaint  Patient presents with  . Annual Exam    wants vit d checked    Pt presents to the office today for CPE without pap. She had her gallbladder removed 12/25/19. States she is doing well. She is complaining of neck, back, and shoulder pain that started 3 months. She is currently working with PT.   Hypertension This is a chronic problem. The current episode started more than 1 year ago. The problem has been resolved since onset. The problem is controlled. Pertinent negatives include no malaise/fatigue, peripheral edema or shortness of breath. Risk factors for coronary artery disease include dyslipidemia, obesity and sedentary lifestyle. The current treatment provides moderate improvement. There is no history of CVA or heart failure.  Gastroesophageal Reflux She complains of belching and heartburn. This is a chronic problem. The current episode started more than 1 year ago. The problem occurs rarely. She has tried a PPI for the symptoms. The treatment provided moderate relief.  Hyperlipidemia This is a chronic problem. The current episode started more than 1 year ago. The problem is uncontrolled. Recent lipid tests were reviewed and are high. Exacerbating diseases include obesity. Pertinent negatives include no shortness of breath. Current antihyperlipidemic treatment includes diet change. The current treatment provides mild improvement of lipids. Risk factors for coronary artery disease include dyslipidemia, hypertension and a sedentary lifestyle.  Constipation This is a chronic problem. The current episode started more than 1 year ago. The problem has been resolved since onset. Associated symptoms include back pain. She has tried stool softeners for the symptoms. The treatment provided moderate relief.  Back Pain This is a new problem. The current episode started more than 1 month ago.  The problem occurs intermittently. The problem has been waxing and waning since onset. The pain is present in the thoracic spine (neck). The pain is at a severity of 4/10. She has tried bed rest and muscle relaxant for the symptoms. The treatment provided mild relief.      Review of Systems  Constitutional: Negative for malaise/fatigue.  Respiratory: Negative for shortness of breath.   Gastrointestinal: Positive for constipation and heartburn.  Musculoskeletal: Positive for back pain.  All other systems reviewed and are negative.  Family History  Problem Relation Age of Onset  . Diabetes Mother   . Hypertension Mother   . Pulmonary embolism Sister   . Hypertension Other   . Diabetes Other    Social History   Socioeconomic History  . Marital status: Married    Spouse name: Not on file  . Number of children: Not on file  . Years of education: Not on file  . Highest education level: Not on file  Occupational History  . Not on file  Tobacco Use  . Smoking status: Never Smoker  . Smokeless tobacco: Never Used  Vaping Use  . Vaping Use: Never used  Substance and Sexual Activity  . Alcohol use: No  . Drug use: No  . Sexual activity: Yes    Birth control/protection: Surgical  Other Topics Concern  . Not on file  Social History Narrative  . Not on file   Social Determinants of Health   Financial Resource Strain:   . Difficulty of Paying Living Expenses: Not on file  Food Insecurity:   . Worried About Charity fundraiser in the Last Year: Not on file  .  Ran Out of Food in the Last Year: Not on file  Transportation Needs:   . Lack of Transportation (Medical): Not on file  . Lack of Transportation (Non-Medical): Not on file  Physical Activity:   . Days of Exercise per Week: Not on file  . Minutes of Exercise per Session: Not on file  Stress:   . Feeling of Stress : Not on file  Social Connections:   . Frequency of Communication with Friends and Family: Not on file  .  Frequency of Social Gatherings with Friends and Family: Not on file  . Attends Religious Services: Not on file  . Active Member of Clubs or Organizations: Not on file  . Attends Archivist Meetings: Not on file  . Marital Status: Not on file       Objective:   Physical Exam Vitals reviewed.  Constitutional:      General: She is not in acute distress.    Appearance: She is well-developed.  HENT:     Head: Normocephalic and atraumatic.     Right Ear: Tympanic membrane normal.     Left Ear: Tympanic membrane normal.  Eyes:     Pupils: Pupils are equal, round, and reactive to light.  Neck:     Thyroid: No thyromegaly.  Cardiovascular:     Rate and Rhythm: Normal rate and regular rhythm.     Heart sounds: Normal heart sounds. No murmur heard.   Pulmonary:     Effort: Pulmonary effort is normal. No respiratory distress.     Breath sounds: Normal breath sounds. No wheezing.  Abdominal:     General: Bowel sounds are normal. There is no distension.     Palpations: Abdomen is soft.     Tenderness: There is no abdominal tenderness.  Musculoskeletal:        General: No tenderness. Normal range of motion.     Cervical back: Normal range of motion and neck supple.  Skin:    General: Skin is warm and dry.  Neurological:     Mental Status: She is alert and oriented to person, place, and time.     Cranial Nerves: No cranial nerve deficit.     Deep Tendon Reflexes: Reflexes are normal and symmetric.  Psychiatric:        Behavior: Behavior normal.        Thought Content: Thought content normal.        Judgment: Judgment normal.          BP 130/79   Pulse 86   Temp 97.8 F (36.6 C) (Temporal)   Ht _0  (1.676 m)   Wt 213 lb 3.2 oz (96.7 kg)   SpO2 97%   BMI 34.41 kg/m   Assessment & Plan:  Sabrina [name] comes in today with chief complaint of Annual Exam (wants vit d checked )   Diagnosis and orders addressed:  1. Tension headache Rest ROM exercises  -  methocarbamol (ROBAXIN) 500 MG tablet; Take 1 tablet (500 mg total) by mouth 3 (three) times daily as needed for muscle spasms.  Dispense: 30 tablet; Refill: 0 - SUMAtriptan (IMITREX) 25 MG tablet; TAKE 1 TABLET (25 MG) TOTAL BY MOUTH ONCE FOR 1 DOSE. MAY REPEAT IN 2 HOURS IF HEADACHE PERSISTS OR RECURS  Dispense: 10 tablet; Refill: 0 - CMP14+EGFR - CBC with Differential/Platelet  2. Cervical muscle pain - methocarbamol (ROBAXIN) 500 MG tablet; Take 1 tablet (500 mg total) by mouth 3 (three) times daily as needed for  muscle spasms.  Dispense: 30 tablet; Refill: 0 - CMP14+EGFR - CBC with Differential/Platelet  3. Essential hypertension, benign - hydrochlorothiazide (HYDRODIURIL) 12.5 MG tablet; Take 1 tablet (12.5 mg total) by mouth daily as needed (fluid).  Dispense: 90 tablet; Refill: 0 - amLODipine (NORVASC) 5 MG tablet; Take 1 tablet (5 mg total) by mouth daily.  Dispense: 90 tablet; Refill: 3 - CMP14+EGFR - CBC with Differential/Platelet  4. Gastroesophageal reflux disease, unspecified whether esophagitis present - CMP14+EGFR - CBC with Differential/Platelet  5. Hyperlipidemia, unspecified hyperlipidemia type - CMP14+EGFR - CBC with Differential/Platelet - Lipid panel  6. Morbid obesity ([place]) - CMP14+EGFR - CBC with Differential/Platelet  7. Vitamin D deficiency - CMP14+EGFR - CBC with Differential/Platelet - VITAMIN D 25 Hydroxy (Vit-D Deficiency, Fractures)  8. Constipation, unspecified constipation type - CMP14+EGFR - CBC with Differential/Platelet  9. Annual physical exam - CMP14+EGFR - CBC with Differential/Platelet - Lipid panel - TSH - Hepatitis C antibody - VITAMIN D 25 Hydroxy (Vit-D Deficiency, Fractures)  10. Hepatitis C antibody test negative - CMP14+EGFR - CBC with Differential/Platelet - Hepatitis C antibody   Labs pending Health Maintenance reviewed Diet and exercise encouraged  Follow up plan: 6 months    Evelina Dun, FNP

## 2020-08-13 NOTE — Patient Instructions (Signed)
Health Maintenance, Female Adopting a healthy lifestyle and getting preventive care are important in promoting health and wellness. Ask your health care provider about:  The right schedule for you to have regular tests and exams.  Things you can do on your own to prevent diseases and keep yourself healthy. What should I know about diet, weight, and exercise? Eat a healthy diet   Eat a diet that includes plenty of vegetables, fruits, low-fat dairy products, and lean protein.  Do not eat a lot of foods that are high in solid fats, added sugars, or sodium. Maintain a healthy weight Body mass index (BMI) is used to identify weight problems. It estimates body fat based on height and weight. Your health care provider can help determine your BMI and help you achieve or maintain a healthy weight. Get regular exercise Get regular exercise. This is one of the most important things you can do for your health. Most adults should:  Exercise for at least 150 minutes each week. The exercise should increase your heart rate and make you sweat (moderate-intensity exercise).  Do strengthening exercises at least twice a week. This is in addition to the moderate-intensity exercise.  Spend less time sitting. Even light physical activity can be beneficial. Watch cholesterol and blood lipids Have your blood tested for lipids and cholesterol at 47 years of age, then have this test every 5 years. Have your cholesterol levels checked more often if:  Your lipid or cholesterol levels are high.  You are older than 47 years of age.  You are at high risk for heart disease. What should I know about cancer screening? Depending on your health history and family history, you may need to have cancer screening at various ages. This may include screening for:  Breast cancer.  Cervical cancer.  Colorectal cancer.  Skin cancer.  Lung cancer. What should I know about heart disease, diabetes, and high blood  pressure? Blood pressure and heart disease  High blood pressure causes heart disease and increases the risk of stroke. This is more likely to develop in people who have high blood pressure readings, are of African descent, or are overweight.  Have your blood pressure checked: ? Every 3-5 years if you are 18-39 years of age. ? Every year if you are 40 years old or older. Diabetes Have regular diabetes screenings. This checks your fasting blood sugar level. Have the screening done:  Once every three years after age 40 if you are at a normal weight and have a low risk for diabetes.  More often and at a younger age if you are overweight or have a high risk for diabetes. What should I know about preventing infection? Hepatitis B If you have a higher risk for hepatitis B, you should be screened for this virus. Talk with your health care provider to find out if you are at risk for hepatitis B infection. Hepatitis C Testing is recommended for:  Everyone born from 1945 through 1965.  Anyone with known risk factors for hepatitis C. Sexually transmitted infections (STIs)  Get screened for STIs, including gonorrhea and chlamydia, if: ? You are sexually active and are younger than 47 years of age. ? You are older than 47 years of age and your health care provider tells you that you are at risk for this type of infection. ? Your sexual activity has changed since you were last screened, and you are at increased risk for chlamydia or gonorrhea. Ask your health care provider if   you are at risk.  Ask your health care provider about whether you are at high risk for HIV. Your health care provider may recommend a prescription medicine to help prevent HIV infection. If you choose to take medicine to prevent HIV, you should first get tested for HIV. You should then be tested every 3 months for as long as you are taking the medicine. Pregnancy  If you are about to stop having your period (premenopausal) and  you may become pregnant, seek counseling before you get pregnant.  Take 400 to 800 micrograms (mcg) of folic acid every day if you become pregnant.  Ask for birth control (contraception) if you want to prevent pregnancy. Osteoporosis and menopause Osteoporosis is a disease in which the bones lose minerals and strength with aging. This can result in bone fractures. If you are 65 years old or older, or if you are at risk for osteoporosis and fractures, ask your health care provider if you should:  Be screened for bone loss.  Take a calcium or vitamin D supplement to lower your risk of fractures.  Be given hormone replacement therapy (HRT) to treat symptoms of menopause. Follow these instructions at home: Lifestyle  Do not use any products that contain nicotine or tobacco, such as cigarettes, e-cigarettes, and chewing tobacco. If you need help quitting, ask your health care provider.  Do not use street drugs.  Do not share needles.  Ask your health care provider for help if you need support or information about quitting drugs. Alcohol use  Do not drink alcohol if: ? Your health care provider tells you not to drink. ? You are pregnant, may be pregnant, or are planning to become pregnant.  If you drink alcohol: ? Limit how much you use to 0-1 drink a day. ? Limit intake if you are breastfeeding.  Be aware of how much alcohol is in your drink. In the [place] one drink equals one 12 oz bottle of beer (355 mL), one 5 oz glass of wine (148 mL), or one 1 oz glass of hard liquor (44 mL). General instructions  Schedule regular health, dental, and eye exams.  Stay current with your vaccines.  Tell your health care provider if: ? You often feel depressed. ? You have ever been abused or do not feel safe at home. Summary  Adopting a healthy lifestyle and getting preventive care are important in promoting health and wellness.  Follow your health care provider's instructions about healthy  diet, exercising, and getting tested or screened for diseases.  Follow your health care provider's instructions on monitoring your cholesterol and blood pressure. This information is not intended to replace advice given to you by your health care provider. Make sure you discuss any questions you have with your health care provider. Document Revised: 09/12/2018 Document Reviewed: 09/12/2018 Elsevier Patient Education  2020 Elsevier Inc.  

## 2020-08-13 NOTE — Therapy (Signed)
[facility] [place], [place], [address] Phone: [phone]   Fax:  [phone]  Physical Therapy Treatment  Patient Details  Name: Sabrina [name] MRN: [id number] Date of Birth: 1971-08-02 Referring Provider (PT): Caryl Pina, MD   Encounter Date: 08/13/2020   PT End of Session - 08/13/20 1348    Visit Number 6    Number of Visits 12    Date for PT Re-Evaluation 09/15/20    Authorization Type FOTO (44% at IE), progress note every 10th visit    PT Start Time 1345    PT Stop Time 1430    PT Time Calculation (min) 45 min    Activity Tolerance Patient tolerated treatment well    Behavior During Therapy [facility] for tasks assessed/performed           Past Medical History:  Diagnosis Date  . GERD (gastroesophageal reflux disease)   . Hypertension     Past Surgical History:  Procedure Laterality Date  . CESAREAN SECTION    . CHOLECYSTECTOMY N/A 12/25/2019   Procedure: LAPAROSCOPIC CHOLECYSTECTOMY WITH INTRAOPERATIVE CHOLANGIOGRAM;  Surgeon: Virl Cagey, MD;  Location: [facility];  Service: General;  Laterality: N/A;  . COLONOSCOPY N/A 12/04/2019    sigmoid diverticulosis.  Marland Kitchen ECTOPIC PREGNANCY SURGERY    . ESOPHAGOGASTRODUODENOSCOPY N/A 12/04/2019   normal esophagus, stomach, and duodenum.     There were no vitals filed for this visit.   Subjective Assessment - 08/13/20 1347    Subjective COVID 19 screening performed on patient upon arrival. Reports some mid back discomfort which started last night.    Pertinent History HTN, GERD    How long can you stand comfortably? 1 hr    How long can you walk comfortably? 1 hr    Diagnostic tests see imaging for X-ray results    Patient Stated Goals decrease pain    Currently in Pain? Yes    Pain Score 5     Pain Location Back    Pain Orientation Left;Mid    Pain Descriptors / Indicators Discomfort;Tightness    Pain Type Chronic pain    Pain Onset More than a month ago     Pain Frequency Intermittent              [facility] PT Assessment - 08/13/20 0001      Assessment   Medical Diagnosis Neck pain, Acute midline thoracic back pain, Tension headache, Cervical muscle pain    Referring Provider (PT) Caryl Pina, MD    Next MD Visit 08/24/2020    Prior Therapy yes      Precautions   Precautions None                         [facility] Adult PT Treatment/Exercise - 08/13/20 0001      Neck Exercises: Machines for Strengthening   UBE (Upper Arm Bike) 90 RPM x6 min      Neck Exercises: Theraband   Rows 20 reps;Limitations    Rows Limitations Blue XTS    Horizontal ABduction 20 reps;Limitations    Horizontal ABduction Limitations yellow theraband      Neck Exercises: Standing   Other Standing Exercises Snow angels x20 reps      Shoulder Exercises: Prone   Other Prone Exercises Prone over ball off edge of table  W, I, T, Y x20 each      Modalities   Modalities Electrical Stimulation;Moist Heat      Moist  Heat Therapy   Number Minutes Moist Heat 15 Minutes    Moist Heat Location Shoulder      Electrical Stimulation   Electrical Stimulation Location B mid and inferior trap    Electrical Stimulation Action IFC    Electrical Stimulation Parameters 80-150 hz x15 min    Electrical Stimulation Goals Pain;Tone      Manual Therapy   Manual Therapy Soft tissue mobilization    Soft tissue mobilization STW to L mid trap, rhomboids, inferior trap to reduce tone and pain                       PT Long Term Goals - 07/28/20 1740      PT LONG TERM GOAL #1   Title Patient will be independent with HEP.    Time 6    Period Weeks    Status New      PT LONG TERM GOAL #2   Title Patient will demonstrate 4+5 or greater bilateral shoulder MMT to improve stability during functional tasks.    Time 6    Period Weeks    Status New      PT LONG TERM GOAL #3   Title Patient will demonstrate 65+ degrees of cervical rotation AROM  to improve ability to scan environment.    Time 6    Period Weeks    Status New      PT LONG TERM GOAL #4   Title Patient will report ability to stand and walk for 2 hours with thoracic and cervical pain less than or equal to 3/10.    Time 6    Period Weeks    Status New      PT LONG TERM GOAL #5   Title Patient will report ability to perform ADLs and home activities with thoracic and cervical pain less than or equal to 3/10.    Time 6    Period Weeks    Status New                 Plan - 08/13/20 1427    Clinical Impression Statement Patient presented in clinic with reports of L mid back tightness which began last night. Patient progressed through lighter postural strengthening with intermittant rest breaks due to muscle fatigue. Minimal L mid trap tone palpable initially during manual therapy. Normal modalities response noted following removal of the modalities. Patient reported relief following end of treatment.    Personal Factors and Comorbidities Age;Time since onset of injury/illness/exacerbation;Comorbidity 1;Comorbidity 2    Comorbidities HTN, GERD    Examination-Activity Limitations Sleep;Stand;Lift    Examination-Participation Restrictions Meal Prep;Cleaning    Stability/Clinical Decision Making Stable/Uncomplicated    Rehab Potential Good    PT Frequency 2x / week    PT Duration 6 weeks    PT Treatment/Interventions ADLs/Self Care Home Management;Cryotherapy;Electrical Stimulation;Iontophoresis 4mg /ml Dexamethasone;Moist Heat;Traction;Ultrasound;Neuromuscular re-education;Therapeutic exercise;Therapeutic activities;Functional mobility training;Patient/family education;Passive range of motion;Dry needling;Manual techniques    PT Next Visit Plan UBE, postural exercises, chin tucks, cervical ROM, cervical and shoulder strengthening, joint mobs for increased mobilization; modalities PRN for pain relief    PT Home Exercise Plan see patient education section    Consulted  and Agree with Plan of Care Patient           Patient will benefit from skilled therapeutic intervention in order to improve the following deficits and impairments:  Decreased activity tolerance, Decreased strength, Decreased range of motion, Difficulty walking, Pain, Postural dysfunction  Visit  Diagnosis: Cervicalgia  Pain in thoracic spine  Abnormal posture     Problem List Patient Active Problem List   Diagnosis Date Noted  . Constipation 08/13/2020  . Calculus of gallbladder without cholecystitis without obstruction 12/17/2019  . Dyspepsia 11/26/2019  . Hyperlipemia 05/01/2018  . Morbid obesity ([place]) 05/01/2018  . GERD (gastroesophageal reflux disease) 02/22/2017  . Vitamin D deficiency 04/25/2016  . Essential hypertension, benign 07/05/2013    Standley Brooking, PTA 08/13/2020, 2:43 PM  [facility] [address] [place], [place], [address] Phone: [phone]   Fax:  [phone]  Name: Denisse Whitenack MRN: [id number] Date of Birth: 1971/02/20

## 2020-08-14 ENCOUNTER — Other Ambulatory Visit: Payer: Self-pay | Admitting: Family

## 2020-08-14 LAB — CBC WITH DIFFERENTIAL/PLATELET
Basophils Absolute: 0 x10E3/uL (ref 0.0–0.2)
Basos: 1 %
EOS (ABSOLUTE): 0.1 x10E3/uL (ref 0.0–0.4)
Eos: 1 %
Hematocrit: 37.4 % (ref 34.0–46.6)
Hemoglobin: 12.8 g/dL (ref 11.1–15.9)
Immature Grans (Abs): 0 x10E3/uL (ref 0.0–0.1)
Immature Granulocytes: 1 %
Lymphocytes Absolute: 1.7 x10E3/uL (ref 0.7–3.1)
Lymphs: 40 %
MCH: 30.6 pg (ref 26.6–33.0)
MCHC: 34.2 g/dL (ref 31.5–35.7)
MCV: 90 fL (ref 79–97)
Monocytes Absolute: 0.3 x10E3/uL (ref 0.1–0.9)
Monocytes: 8 %
Neutrophils Absolute: 2.2 x10E3/uL (ref 1.4–7.0)
Neutrophils: 49 %
Platelets: 308 x10E3/uL (ref 150–450)
RBC: 4.18 x10E6/uL (ref 3.77–5.28)
RDW: 11 % — ABNORMAL LOW (ref 11.7–15.4)
WBC: 4.3 x10E3/uL (ref 3.4–10.8)

## 2020-08-14 LAB — CMP14+EGFR
ALT: 46 IU/L — ABNORMAL HIGH (ref 0–32)
AST: 26 IU/L (ref 0–40)
Albumin/Globulin Ratio: 1.8 (ref 1.2–2.2)
Albumin: 4.1 g/dL (ref 3.8–4.8)
Alkaline Phosphatase: 74 IU/L (ref 44–121)
BUN/Creatinine Ratio: 6 — ABNORMAL LOW (ref 9–23)
BUN: 6 mg/dL (ref 6–24)
Bilirubin Total: 0.4 mg/dL (ref 0.0–1.2)
CO2: 25 mmol/L (ref 20–29)
Calcium: 10.4 mg/dL — ABNORMAL HIGH (ref 8.7–10.2)
Chloride: 102 mmol/L (ref 96–106)
Creatinine, Ser: 0.96 mg/dL (ref 0.57–1.00)
GFR calc Af Amer: 81 mL/min/{1.73_m2} (ref >59)
GFR calc non Af Amer: 71 mL/min/{1.73_m2} (ref >59)
Globulin, Total: 2.3 g/dL (ref 1.5–4.5)
Glucose: 85 mg/dL (ref 65–99)
Potassium: 4.3 mmol/L (ref 3.5–5.2)
Sodium: 138 mmol/L (ref 134–144)
Total Protein: 6.4 g/dL (ref 6.0–8.5)

## 2020-08-14 LAB — LIPID PANEL
Chol/HDL Ratio: 4 ratio (ref 0.0–4.4)
Cholesterol, Total: 210 mg/dL — ABNORMAL HIGH (ref 100–199)
HDL: 53 mg/dL (ref >39)
LDL Chol Calc (NIH): 138 mg/dL — ABNORMAL HIGH (ref 0–99)
Triglycerides: 105 mg/dL (ref 0–149)
VLDL Cholesterol Cal: 19 mg/dL (ref 5–40)

## 2020-08-14 LAB — VITAMIN D 25 HYDROXY (VIT D DEFICIENCY, FRACTURES): Vit D, 25-Hydroxy: 24.2 ng/mL — ABNORMAL LOW (ref 30.0–100.0)

## 2020-08-14 LAB — HEPATITIS C ANTIBODY: Hep C Virus Ab: <0.1 {s_co_ratio} (ref 0.0–0.9)

## 2020-08-14 LAB — TSH: TSH: 1.63 u[IU]/mL (ref 0.450–4.500)

## 2020-08-14 MED ORDER — VITAMIN D (ERGOCALCIFEROL) 1.25 MG (50000 UNIT) PO CAPS: 50000.0000 [IU] | ORAL_CAPSULE | ORAL | 3 refills | Status: DC

## 2020-08-17 ENCOUNTER — Telehealth: Payer: Self-pay | Admitting: Family

## 2020-08-17 NOTE — Telephone Encounter (Signed)
Please advise 

## 2020-08-17 NOTE — Telephone Encounter (Signed)
Pt called back to see if a provider had given advise on the issue she is having with not being able to take tylenol for pain. Please send to Christys covering provider (Dr Building control surveyor) so he can advise ASAP per pt request.

## 2020-08-17 NOTE — Telephone Encounter (Signed)
Pt was told on Friday to stop taking Tylenol because of her liver enzymes and states that she was in pain over the weekend, tried Advil and it did not help with the pain

## 2020-08-18 ENCOUNTER — Ambulatory Visit: Payer: 59 | Admitting: Physical Therapy

## 2020-08-18 ENCOUNTER — Other Ambulatory Visit: Payer: Self-pay

## 2020-08-18 ENCOUNTER — Encounter: Payer: Self-pay | Admitting: Physical Therapy

## 2020-08-18 DIAGNOSIS — R293 Abnormal posture: Secondary | ICD-10-CM

## 2020-08-18 DIAGNOSIS — M546 Pain in thoracic spine: Secondary | ICD-10-CM

## 2020-08-18 DIAGNOSIS — M542 Cervicalgia: Secondary | ICD-10-CM

## 2020-08-18 NOTE — Telephone Encounter (Signed)
Pt states Advil doesn't help so scheduled her for MyChart Virtual visit tomorrow at 4:45 but pt is aware to have her phone on her at all times as she could get the text at anytime tomorrow.

## 2020-08-18 NOTE — Therapy (Signed)
[facility] [place], [place], [address] Phone: [phone]   Fax:  [phone]  Physical Therapy Treatment  Patient Details  Name: Sabrina [name] MRN: [id number] Date of Birth: 13-Jun-1971 Referring Provider (PT): Caryl Pina, MD   Encounter Date: 08/18/2020   PT End of Session - 08/18/20 1955    Visit Number 7    Number of Visits 12    Date for PT Re-Evaluation 09/15/20    Authorization Type FOTO (44% at IE), progress note every 10th visit    PT Start Time 1645    PT Stop Time 1733    PT Time Calculation (min) 48 min    Activity Tolerance Patient tolerated treatment well    Behavior During Therapy [facility] for tasks assessed/performed           Past Medical History:  Diagnosis Date  . GERD (gastroesophageal reflux disease)   . Hypertension     Past Surgical History:  Procedure Laterality Date  . CESAREAN SECTION    . CHOLECYSTECTOMY N/A 12/25/2019   Procedure: LAPAROSCOPIC CHOLECYSTECTOMY WITH INTRAOPERATIVE CHOLANGIOGRAM;  Surgeon: Virl Cagey, MD;  Location: [facility];  Service: General;  Laterality: N/A;  . COLONOSCOPY N/A 12/04/2019    sigmoid diverticulosis.  Marland Kitchen ECTOPIC PREGNANCY SURGERY    . ESOPHAGOGASTRODUODENOSCOPY N/A 12/04/2019   normal esophagus, stomach, and duodenum.     There were no vitals filed for this visit.   Subjective Assessment - 08/18/20 1954    Subjective COVID 19 screening performed on patient upon arrival. Reported having to stop taking tylenol secondary to increase in liver enzymes. Had increase of pain over the weekend. Pain is about 3/10 in mid back today    Pertinent History HTN, GERD    How long can you stand comfortably? 1 hr    How long can you walk comfortably? 1 hr    Diagnostic tests see imaging for X-ray results    Patient Stated Goals decrease pain    Currently in Pain? Yes    Pain Score 3     Pain Location Back    Pain Orientation Left;Mid    Pain Descriptors /  Indicators Discomfort;Tightness    Pain Type Chronic pain              [facility] PT Assessment - 08/18/20 0001      Assessment   Medical Diagnosis Neck pain, Acute midline thoracic back pain, Tension headache, Cervical muscle pain    Referring Provider (PT) Caryl Pina, MD    Next MD Visit 08/24/2020    Prior Therapy yes      Precautions   Precautions None                         [facility] Adult PT Treatment/Exercise - 08/18/20 0001      Neck Exercises: Machines for Strengthening   UBE (Upper Arm Bike) 120 RPM 8 mins (4 fwd, 4 bwd)      Modalities   Modalities Electrical Stimulation;Moist Heat;Ultrasound      Moist Heat Therapy   Number Minutes Moist Heat 10 Minutes    Moist Heat Location Lumbar Spine;Other (comment)   mid back     Acupuncturist Stimulation Location L mid back and QL     Electrical Stimulation Action pre-mod    Electrical Stimulation Parameters 80-150 hz x10 mins    Electrical Stimulation Goals Pain;Tone      Manual Therapy  Manual Therapy Soft tissue mobilization    Soft tissue mobilization STW to L mid trap, rhomboids, and L QL to reduce tone and pain                       PT Long Term Goals - 07/28/20 1740      PT LONG TERM GOAL #1   Title Patient will be independent with HEP.    Time 6    Period Weeks    Status New      PT LONG TERM GOAL #2   Title Patient will demonstrate 4+5 or greater bilateral shoulder MMT to improve stability during functional tasks.    Time 6    Period Weeks    Status New      PT LONG TERM GOAL #3   Title Patient will demonstrate 65+ degrees of cervical rotation AROM to improve ability to scan environment.    Time 6    Period Weeks    Status New      PT LONG TERM GOAL #4   Title Patient will report ability to stand and walk for 2 hours with thoracic and cervical pain less than or equal to 3/10.    Time 6    Period Weeks    Status New      PT LONG TERM GOAL  #5   Title Patient will report ability to perform ADLs and home activities with thoracic and cervical pain less than or equal to 3/10.    Time 6    Period Weeks    Status New                 Plan - 08/18/20 1955    Clinical Impression Statement Patient arrives to physical therapy with an acute exacerbation of pain. Patient able to complete UBE with minimal complaints. Due to increase of pain TEs were put on hold for more conservative treatment. Combo US/E-stim performed with no adverse affects. STW/M noted with increased paraspinal tone and with increased tone in QL region as patient reported an increase in pain there as well. No adverse affects upon removal of modalities.    Personal Factors and Comorbidities Age;Time since onset of injury/illness/exacerbation;Comorbidity 1;Comorbidity 2    Comorbidities HTN, GERD    Examination-Activity Limitations Sleep;Stand;Lift    Examination-Participation Restrictions Meal Prep;Cleaning    Stability/Clinical Decision Making Stable/Uncomplicated    Clinical Decision Making Low    Rehab Potential Good    PT Frequency 2x / week    PT Duration 6 weeks    PT Treatment/Interventions ADLs/Self Care Home Management;Cryotherapy;Electrical Stimulation;Iontophoresis 4mg /ml Dexamethasone;Moist Heat;Traction;Ultrasound;Neuromuscular re-education;Therapeutic exercise;Therapeutic activities;Functional mobility training;Patient/family education;Passive range of motion;Dry needling;Manual techniques    PT Next Visit Plan assess pain, conservative treatment if pain levels are high; UBE, postural exercises, chin tucks, cervical ROM, cervical and shoulder strengthening, joint mobs for increased mobilization; modalities PRN for pain relief    PT Home Exercise Plan see patient education section    Consulted and Agree with Plan of Care Patient           Patient will benefit from skilled therapeutic intervention in order to improve the following deficits and  impairments:  Decreased activity tolerance, Decreased strength, Decreased range of motion, Difficulty walking, Pain, Postural dysfunction  Visit Diagnosis: Cervicalgia  Pain in thoracic spine  Abnormal posture     Problem List Patient Active Problem List   Diagnosis Date Noted  . Constipation 08/13/2020  . Calculus of gallbladder without  cholecystitis without obstruction 12/17/2019  . Dyspepsia 11/26/2019  . Hyperlipemia 05/01/2018  . Morbid obesity ([place]) 05/01/2018  . GERD (gastroesophageal reflux disease) 02/22/2017  . Vitamin D deficiency 04/25/2016  . Essential hypertension, benign 07/05/2013    Gabriela Eves, PT, DPT 08/18/2020, 8:02 PM  [facility] [address] [place], [place], [address] Phone: [phone]   Fax:  [phone]  Name: Sabrina [name] MRN: [id number] Date of Birth: Nov 24, 1970

## 2020-08-18 NOTE — Telephone Encounter (Signed)
Would continue with the Advil, if it is not helping then please schedule an appointment so she can be seen to evaluate the pain

## 2020-08-19 ENCOUNTER — Telehealth (INDEPENDENT_AMBULATORY_CARE_PROVIDER_SITE_OTHER): Payer: 59 | Admitting: Family

## 2020-08-19 ENCOUNTER — Encounter: Payer: Self-pay | Admitting: Family

## 2020-08-19 DIAGNOSIS — M546 Pain in thoracic spine: Secondary | ICD-10-CM

## 2020-08-19 MED ORDER — DICLOFENAC SODIUM 75 MG PO TBEC: 75.0000 mg | DELAYED_RELEASE_TABLET | Freq: Two times a day (BID) | ORAL | 2 refills | Status: DC

## 2020-08-19 NOTE — Progress Notes (Signed)
   Virtual Visit via telephone Note Due to COVID-19 pandemic this visit was conducted virtually. This visit type was conducted due to national recommendations for restrictions regarding the COVID-19 Pandemic (e.g. social distancing, sheltering in place) in an effort to limit this patient's exposure and mitigate transmission in our community. All issues noted in this document were discussed and addressed.  A physical exam was not performed with this format.  I connected with Sabrina [name] on 08/19/20 at 11:59 pm  by telephone and verified that I am speaking with the correct person using two identifiers. Sabrina [name] is currently located at home and no one is currently with her during visit. The provider, Evelina Dun, FNP is located in their office at time of visit.  I discussed the limitations, risks, security and privacy concerns of performing an evaluation and management service by telephone and the availability of in person appointments. I also discussed with the patient that there may be a patient responsible charge related to this service. The patient expressed understanding and agreed to proceed.   History and Present Illness:  Pt calls the office today with recurrent thoracic back pain. She is currently working with PT. She was taking tylenol at night, but on her last visit she was told her liver enzymes were slightly elevated and to limit tylenol. She reports her pain is worse at night.  Back Pain This is a recurrent problem. The current episode started in the past 7 days. The problem occurs intermittently. The problem has been waxing and waning since onset. The pain is present in the lumbar spine. The quality of the pain is described as aching. The pain is at a severity of 3/10 (over the weekend a 10). The pain is moderate. The pain is worse during the night. The symptoms are aggravated by bending and lying down.      Review of Systems  Musculoskeletal: Positive for back pain.      Observations/Objective: No SOB or distress noted  Assessment and Plan: 1. Bilateral thoracic back pain, unspecified chronicity Continue PT Continue Baclofen as needed Will start Voltaren BID with food Can use Tylenol as needed Call if symptoms worsen or do not improve,  - diclofenac (VOLTAREN) 75 MG EC tablet; Take 1 tablet (75 mg total) by mouth 2 (two) times daily.  Dispense: 60 tablet; Refill: 2        I discussed the assessment and treatment plan with the patient. The patient was provided an opportunity to ask questions and all were answered. The patient agreed with the plan and demonstrated an understanding of the instructions.   The patient was advised to call back or seek an in-person evaluation if the symptoms worsen or if the condition fails to improve as anticipated.  The above assessment and management plan was discussed with the patient. The patient verbalized understanding of and has agreed to the management plan. Patient is aware to call the clinic if symptoms persist or worsen. Patient is aware when to return to the clinic for a follow-up visit. Patient educated on when it is appropriate to go to the emergency department.   Time call ended:  12: 12 pm   I provided 13 minutes of non-face-to-face time during this encounter.    Evelina Dun, FNP

## 2020-08-20 ENCOUNTER — Other Ambulatory Visit: Payer: Self-pay

## 2020-08-20 ENCOUNTER — Encounter: Payer: Self-pay | Admitting: Physical Therapy

## 2020-08-20 ENCOUNTER — Ambulatory Visit: Payer: 59 | Admitting: Physical Therapy

## 2020-08-20 DIAGNOSIS — M546 Pain in thoracic spine: Secondary | ICD-10-CM

## 2020-08-20 DIAGNOSIS — R293 Abnormal posture: Secondary | ICD-10-CM

## 2020-08-20 DIAGNOSIS — M542 Cervicalgia: Secondary | ICD-10-CM

## 2020-08-20 NOTE — Therapy (Addendum)
[facility] [place], [place], [address] Phone: [phone]   Fax:  [phone]  Physical Therapy Treatment  Patient Details  Name: Sabrina [name] MRN: [id number] Date of Birth: [birth year] Referring Provider (PT): Caryl Pina, MD   Encounter Date: 08/20/2020   PT End of Session - 08/20/20 1652    Visit Number 8    Number of Visits 12    Date for PT Re-Evaluation 09/15/20    Authorization Type FOTO (44% at IE), progress note every 10th visit    PT Start Time 1650    PT Stop Time 1735    PT Time Calculation (min) 45 min    Activity Tolerance Patient tolerated treatment well    Behavior During Therapy [facility] for tasks assessed/performed           Past Medical History:  Diagnosis Date  . GERD (gastroesophageal reflux disease)   . Hypertension     Past Surgical History:  Procedure Laterality Date  . CESAREAN SECTION    . CHOLECYSTECTOMY N/A 12/25/2019   Procedure: LAPAROSCOPIC CHOLECYSTECTOMY WITH INTRAOPERATIVE CHOLANGIOGRAM;  Surgeon: Virl Cagey, MD;  Location: [facility];  Service: General;  Laterality: N/A;  . COLONOSCOPY N/A 12/04/2019    sigmoid diverticulosis.  Marland Kitchen ECTOPIC PREGNANCY SURGERY    . ESOPHAGOGASTRODUODENOSCOPY N/A 12/04/2019   normal esophagus, stomach, and duodenum.     There were no vitals filed for this visit.   Subjective Assessment - 08/20/20 1651    Subjective COVID 19 screening performed on patient upon arrival. Reports that she feels better today and her low back feels better as well. Report soreness in L low back.    Pertinent History HTN, GERD    How long can you stand comfortably? 1 hr    How long can you walk comfortably? 1 hr    Diagnostic tests see imaging for X-ray results    Patient Stated Goals decrease pain    Currently in Pain? Yes    Pain Score 2     Pain Location Back    Pain Orientation Left;Mid    Pain Descriptors / Indicators Discomfort    Pain Type Chronic pain     Pain Onset More than a month ago    Pain Frequency Intermittent              [facility] PT Assessment - 08/20/20 0001      Assessment   Medical Diagnosis Neck pain, Acute midline thoracic back pain, Tension headache, Cervical muscle pain    Referring Provider (PT) Caryl Pina, MD    Next MD Visit 08/24/2020    Prior Therapy yes      Precautions   Precautions None                         [facility] Adult PT Treatment/Exercise - 08/20/20 0001      Neck Exercises: Machines for Strengthening   UBE (Upper Arm Bike) 90 RPM x6 min (forward/backward)      Neck Exercises: Theraband   Scapula Retraction 20 reps;Limitations    Scapula Retraction Limitations Green XTS    Shoulder Extension 20 reps;Limitations    Shoulder Extension Limitations Green XTS      Neck Exercises: Standing   Wall Push Ups 20 reps      Shoulder Exercises: Supine   Other Supine Exercises Ab set x10 reps 5 sec holds      Shoulder Exercises: Standing   Other Standing Exercises  Core pushdown at file cabinet x15 reps 5 sec      Shoulder Exercises: ROM/Strengthening   X to V Arms x20 reps      Modalities   Modalities Electrical Stimulation;Moist Heat      Moist Heat Therapy   Number Minutes Moist Heat 15 Minutes    Moist Heat Location Lumbar Spine;Other (comment)   Thoracic      Acupuncturist Location B mid back/ QL    Electrical Stimulation Action Pre-Mod    Electrical Stimulation Parameters 80-150 hz x15 min    Electrical Stimulation Goals Pain;Tone      Manual Therapy   Manual Therapy Soft tissue mobilization    Soft tissue mobilization STW to L mid trap, rhomboids, and L QL to reduce tone and pain                  PT Education - 08/20/20 1753    Education Details ab set    Person(s) Educated Patient    Methods Explanation;Handout    Comprehension Verbalized understanding               PT Long Term Goals - 07/28/20 1740       PT LONG TERM GOAL #1   Title Patient will be independent with HEP.    Time 6    Period Weeks    Status New      PT LONG TERM GOAL #2   Title Patient will demonstrate 4+5 or greater bilateral shoulder MMT to improve stability during functional tasks.    Time 6    Period Weeks    Status New      PT LONG TERM GOAL #3   Title Patient will demonstrate 65+ degrees of cervical rotation AROM to improve ability to scan environment.    Time 6    Period Weeks    Status New      PT LONG TERM GOAL #4   Title Patient will report ability to stand and walk for 2 hours with thoracic and cervical pain less than or equal to 3/10.    Time 6    Period Weeks    Status New      PT LONG TERM GOAL #5   Title Patient will report ability to perform ADLs and home activities with thoracic and cervical pain less than or equal to 3/10.    Time 6    Period Weeks    Status New                 Plan - 08/20/20 1732    Clinical Impression Statement Patient presented in clinic with reports of low level mid back pain and low back soreness. Patient was prescribed Diclofenac and has felt a big difference already. Patient guided through light postural strengthening as well as core strengthening. No complaints of pain reported via patient during therex. Patient provided ab set for HEP for functional core strength. Patient continues to present with increased tone of L QL and lumbar paraspinals. No abnormal tone palpable in L mid trap or rhomboids. Normal modalities response noted following removal of the modalities. Patient was understanding of instruction provided for HEP.    Personal Factors and Comorbidities Age;Time since onset of injury/illness/exacerbation;Comorbidity 1;Comorbidity 2    Comorbidities HTN, GERD    Examination-Activity Limitations Sleep;Stand;Lift    Examination-Participation Restrictions Meal Prep;Cleaning    Stability/Clinical Decision Making Stable/Uncomplicated    Rehab Potential Good     PT  Frequency 2x / week    PT Duration 6 weeks    PT Treatment/Interventions ADLs/Self Care Home Management;Cryotherapy;Electrical Stimulation;Iontophoresis 4mg /ml Dexamethasone;Moist Heat;Traction;Ultrasound;Neuromuscular re-education;Therapeutic exercise;Therapeutic activities;Functional mobility training;Patient/family education;Passive range of motion;Dry needling;Manual techniques    PT Next Visit Plan assess pain, conservative treatment if pain levels are high; UBE, postural exercises, chin tucks, cervical ROM, cervical and shoulder strengthening, joint mobs for increased mobilization; modalities PRN for pain relief    PT Home Exercise Plan see patient education section    Consulted and Agree with Plan of Care Patient           Patient will benefit from skilled therapeutic intervention in order to improve the following deficits and impairments:  Decreased activity tolerance, Decreased strength, Decreased range of motion, Difficulty walking, Pain, Postural dysfunction  Visit Diagnosis: Cervicalgia  Pain in thoracic spine  Abnormal posture     Problem List Patient Active Problem List   Diagnosis Date Noted  . Constipation 08/13/2020  . Calculus of gallbladder without cholecystitis without obstruction 12/17/2019  . Dyspepsia 11/26/2019  . Hyperlipemia 05/01/2018  . Morbid obesity ([place]) 05/01/2018  . GERD (gastroesophageal reflux disease) 02/22/2017  . Vitamin D deficiency 04/25/2016  . Essential hypertension, benign 07/05/2013    Standley Brooking, PTA 08/20/2020, 5:53 PM  [facility] [address] [place], [place], [address] Phone: [phone]   Fax:  [phone]  Name: Sabrina [name] MRN: [id number] Date of Birth: 1971-02-04

## 2020-08-25 ENCOUNTER — Encounter: Payer: Self-pay | Admitting: Physical Therapy

## 2020-08-25 ENCOUNTER — Other Ambulatory Visit: Payer: Self-pay

## 2020-08-25 ENCOUNTER — Ambulatory Visit: Payer: 59 | Admitting: Physical Therapy

## 2020-08-25 DIAGNOSIS — R293 Abnormal posture: Secondary | ICD-10-CM

## 2020-08-25 DIAGNOSIS — M542 Cervicalgia: Secondary | ICD-10-CM | POA: Diagnosis not present

## 2020-08-25 DIAGNOSIS — M546 Pain in thoracic spine: Secondary | ICD-10-CM

## 2020-08-25 NOTE — Therapy (Addendum)
[facility] [place], [place], [address] Phone: [phone]   Fax:  [phone]  Physical Therapy Treatment PHYSICAL THERAPY DISCHARGE SUMMARY  Visits from Start of Care: 9  Current functional level related to goals / functional outcomes: See below   Remaining deficits: See goals   Education / Equipment: HEP Plan: Patient agrees to discharge.  Patient goals were not met. Patient is being discharged due to lack of progress.  ?????  Patient recommended to return to MD.    Patient Details  Name: Sabrina [name] MRN: [id number] Date of Birth: [birth year] Referring Provider (PT): Caryl Pina, MD   Encounter Date: 08/25/2020   PT End of Session - 08/25/20 1650    Visit Number 9    Number of Visits 12    Date for PT Re-Evaluation 09/15/20    Authorization Type FOTO (44% at IE), progress note every 10th visit    PT Start Time 1646    PT Stop Time 1728    PT Time Calculation (min) 42 min    Activity Tolerance Patient tolerated treatment well    Behavior During Therapy [facility] for tasks assessed/performed           Past Medical History:  Diagnosis Date  . GERD (gastroesophageal reflux disease)   . Hypertension     Past Surgical History:  Procedure Laterality Date  . CESAREAN SECTION    . CHOLECYSTECTOMY N/A 12/25/2019   Procedure: LAPAROSCOPIC CHOLECYSTECTOMY WITH INTRAOPERATIVE CHOLANGIOGRAM;  Surgeon: Virl Cagey, MD;  Location: [facility];  Service: General;  Laterality: N/A;  . COLONOSCOPY N/A 12/04/2019    sigmoid diverticulosis.  Marland Kitchen ECTOPIC PREGNANCY SURGERY    . ESOPHAGOGASTRODUODENOSCOPY N/A 12/04/2019   normal esophagus, stomach, and duodenum.     There were no vitals filed for this visit.   Subjective Assessment - 08/25/20 1648    Subjective COVID 19 screening performed on patient upon arrival. Reports that after last treatment, Thursday and Friday was rough and had pain into center of her chest.  Pain is better now but fluctuating between 2-3/10.    Pertinent History HTN, GERD    How long can you stand comfortably? 1 hr    How long can you walk comfortably? 1 hr    Diagnostic tests see imaging for X-ray results    Patient Stated Goals decrease pain    Currently in Pain? Yes    Pain Score 3     Pain Location Back    Pain Orientation Left;Mid    Pain Descriptors / Indicators Discomfort    Pain Type Chronic pain    Pain Onset More than a month ago    Pain Frequency Constant              [facility] PT Assessment - 08/25/20 0001      Assessment   Medical Diagnosis Neck pain, Acute midline thoracic back pain, Tension headache, Cervical muscle pain    Referring Provider (PT) Caryl Pina, MD    Next MD Visit 08/24/2020    Prior Therapy yes      Precautions   Precautions None                         [facility] Adult PT Treatment/Exercise - 08/25/20 0001      Neck Exercises: Machines for Strengthening   UBE (Upper Arm Bike) 90 RPM x8 min (forward/backward)      Neck Exercises: Seated   W Back 10  reps    Shoulder Flexion Right;20 reps;Weights    Shoulder Flexion Weights (lbs) 1    Shoulder ABduction Right;20 reps;Weights    Shoulder Abduction Weights (lbs) 1    Other Seated Exercise OH press with scap squeeze x15 reps    Other Seated Exercise R shoulder scaption 1# x20 reps; L UT, levator scap, cervical paraspinals stretch 3x30 sec      Shoulder Exercises: Prone   Extension Strengthening;Right;20 reps;Weights    Extension Weight (lbs) 1    Horizontal ABduction 1 Strengthening;Right;20 reps;Weights    Horizontal ABduction 1 Weight (lbs) 1    Other Prone Exercises Prone R shoulder inverted V 1# x20 reps      Modalities   Modalities Electrical Stimulation;Moist Heat      Moist Heat Therapy   Number Minutes Moist Heat 15 Minutes    Moist Heat Location Lumbar Spine;Other (comment)   thoracic spine     Electrical Stimulation   Electrical Stimulation  Location L thoracic paraspinals    Electrical Stimulation Action Pre-Mod    Electrical Stimulation Parameters 80-150 hz x15 min    Electrical Stimulation Goals Pain                       PT Long Term Goals - 08/25/20 1753      PT LONG TERM GOAL #1   Title Patient will be independent with HEP.    Time 6    Period Weeks    Status Achieved      PT LONG TERM GOAL #2   Title Patient will demonstrate 4+5 or greater bilateral shoulder MMT to improve stability during functional tasks.    Time 6    Period Weeks    Status On-going      PT LONG TERM GOAL #3   Title Patient will demonstrate 65+ degrees of cervical rotation AROM to improve ability to scan environment.    Time 6    Period Weeks    Status On-going      PT LONG TERM GOAL #4   Title Patient will report ability to stand and walk for 2 hours with thoracic and cervical pain less than or equal to 3/10.    Time 6    Period Weeks    Status On-going      PT LONG TERM GOAL #5   Title Patient will report ability to perform ADLs and home activities with thoracic and cervical pain less than or equal to 3/10.    Time 6    Period Weeks    Status On-going                 Plan - 08/25/20 1736    Clinical Impression Statement Patient presented in clinic with reports of greater pain last week after treatment along with an episode of mid chest pain. Patient advised to contact PCP due to lack of progress and continued pain along with chest pain. Patient guided through RUE strengthening and L cervical and thoracic stretching. No other pain reported during therex session. Normal modalities response noted following removal of the modalities. Limited progression of goals due to pain.    Personal Factors and Comorbidities Age;Time since onset of injury/illness/exacerbation;Comorbidity 1;Comorbidity 2    Comorbidities HTN, GERD    Examination-Activity Limitations Sleep;Stand;Lift    Examination-Participation Restrictions Meal  Prep;Cleaning    Stability/Clinical Decision Making Stable/Uncomplicated    Rehab Potential Good    PT Frequency 2x / week  PT Duration 6 weeks    PT Treatment/Interventions ADLs/Self Care Home Management;Cryotherapy;Electrical Stimulation;Iontophoresis 24m/ml Dexamethasone;Moist Heat;Traction;Ultrasound;Neuromuscular re-education;Therapeutic exercise;Therapeutic activities;Functional mobility training;Patient/family education;Passive range of motion;Dry needling;Manual techniques    PT Next Visit Plan On hold; Patient to contact PCP due to pain.    PT Home Exercise Plan see patient education section    Consulted and Agree with Plan of Care Patient           Patient will benefit from skilled therapeutic intervention in order to improve the following deficits and impairments:  Decreased activity tolerance, Decreased strength, Decreased range of motion, Difficulty walking, Pain, Postural dysfunction  Visit Diagnosis: Cervicalgia  Pain in thoracic spine  Abnormal posture     Problem List Patient Active Problem List   Diagnosis Date Noted  . Constipation 08/13/2020  . Calculus of gallbladder without cholecystitis without obstruction 12/17/2019  . Dyspepsia 11/26/2019  . Hyperlipemia 05/01/2018  . [place] 05/01/2018  . GERD (gastroesophageal reflux disease) 02/22/2017  . Vitamin D deficiency 04/25/2016  . Essential hypertension, benign 07/05/2013    KStandley Brooking PTA 08/25/2020, 5:53 PM  [facility] [address]Phone: [phone]  Fax:  [phone] Name: Sabrina BenscoterMRN: [id number] of Birth: [birth year]

## 2020-08-26 ENCOUNTER — Telehealth: Payer: Self-pay

## 2020-08-26 DIAGNOSIS — M546 Pain in thoracic spine: Secondary | ICD-10-CM

## 2020-08-26 NOTE — Telephone Encounter (Signed)
Patient aware Sabrina [name] not here until Monday and she would review then. Patient would like to Try for MRI first since she has already met her deductible.

## 2020-08-31 NOTE — Telephone Encounter (Signed)
Patient aware- Patient states that last week on 08/21/20 after she had PT she got a muscle pain on the left side of her chest that was on and off.  States it is gone now but tender when touching it.

## 2020-08-31 NOTE — Telephone Encounter (Signed)
MRI and Ortho referral placed.

## 2020-09-01 ENCOUNTER — Encounter: Payer: 59 | Admitting: Physical Therapy

## 2020-09-03 ENCOUNTER — Encounter: Payer: 59 | Admitting: Physical Therapy

## 2020-09-15 ENCOUNTER — Other Ambulatory Visit: Payer: Self-pay

## 2020-09-15 ENCOUNTER — Ambulatory Visit (HOSPITAL_COMMUNITY)
Admission: RE | Admit: 2020-09-15 | Discharge: 2020-09-15 | Disposition: A | Discharge status: Home / Self Care | Payer: 59 | Source: Ambulatory Visit | Attending: Family | Admitting: Family

## 2020-09-15 DIAGNOSIS — M546 Pain in thoracic spine: Secondary | ICD-10-CM

## 2020-09-16 ENCOUNTER — Other Ambulatory Visit: Payer: Self-pay | Admitting: Family

## 2020-09-16 DIAGNOSIS — M5124 Other intervertebral disc displacement, thoracic region: Secondary | ICD-10-CM

## 2020-09-17 ENCOUNTER — Other Ambulatory Visit: Payer: Self-pay

## 2020-09-17 ENCOUNTER — Ambulatory Visit (INDEPENDENT_AMBULATORY_CARE_PROVIDER_SITE_OTHER): Payer: 59 | Admitting: Orthopaedic Surgery

## 2020-09-17 ENCOUNTER — Encounter: Payer: Self-pay | Admitting: Orthopaedic Surgery

## 2020-09-17 DIAGNOSIS — M5134 Other intervertebral disc degeneration, thoracic region: Secondary | ICD-10-CM

## 2020-09-17 NOTE — Progress Notes (Signed)
Office Visit Note   Patient: Sabrina [name]           Date of Birth: [birth year]           MRN: [id number] Visit Date: 09/17/2020              Requested by: [name], [address] [place],  [place] [address] PCP: [name], FNP   Assessment & Plan: Visit Diagnoses:  1. 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  We reviewed the images.  She is already had some TENS unit therapy, different medications.  She does not have cord compression.  She can follow-up with me on an as-needed basis.  Currently her neck symptoms are not severe enough to consider diagnostic imaging.  Follow-Up Instructions: Return if symptoms worsen or fail to improve.   Orders:  No orders of the defined types were placed in this encounter.  No orders of the defined types were placed in this encounter.     Procedures: No procedures performed   Clinical Data: No additional findings.   Subjective: Chief Complaint  Patient presents with  . Middle Back - Pain  . Neck - Pain    HPI 49 year old female treated by [facility] has had problems with the several months pain more in the thoracic region than in the neck.  Neck sometimes been radiating in the right shoulder occasionally numbness in her fingers no dropping objects.  Discomfort in her thoracic region tends to bother her more when she standing 1 position such as cooking.  She has not noticed any weakness she can walk up stairs.  She works as a Marketing executive.  Non-smoker.  No past history of injury.  MRI scan was ordered of the thoracic spine which showed thoracic disc degeneration with some shallow disc herniations from T7-8 through T11-12 without cord compression.  She states she does well when she moves around and is active.  Occasionally she has had some pain that radiates down to the lumbosacral  junction sometimes been posteriorly in her thigh.  No bowel bladder symptoms no falling.  She denies chills or fever.  Patient does have some acid reflux and hypertension.  Otherwise she has been healthy.  Review of Systems previous gallbladder removal C-section tubal ligation.  All other systems are noncontributory to HPI.   Objective: Vital Signs: Ht 5\' 6"  (1.676 m)   Wt 213 lb (96.6 kg)   BMI 34.38 kg/m   Physical Exam Constitutional:      Appearance: She is well-developed.  HENT:     Head: Normocephalic.     Right Ear: External ear normal.     Left Ear: External ear normal.  Eyes:     Pupils: Pupils are equal, round, and reactive to light.  Neck:     Thyroid: No thyromegaly.     Trachea: No tracheal deviation.  Cardiovascular:     Rate and Rhythm: Normal rate.  Pulmonary:     Effort: Pulmonary effort is normal.  Abdominal:     Palpations: Abdomen is soft.  Skin:    General: Skin is warm and dry.  Neurological:     Mental Status: She is alert and oriented to person, place, and time.  Psychiatric:        Mood and Affect: Mood and affect normal.  Behavior: Behavior normal.     Ortho Exam normal heel toe gait.  Sensation is intact lower extremities.  Specialty Comments:  No specialty comments available.  Imaging: Narrative & Impression  CLINICAL DATA:  Mid to upper back pain over the last 4 months.  EXAM: MRI THORACIC SPINE WITHOUT CONTRAST  TECHNIQUE: Multiplanar, multisequence MR imaging of the thoracic spine was performed. No intravenous contrast was administered.  COMPARISON:  Radiography 07/20/2020  FINDINGS: Alignment:  Normal  Vertebrae: No fracture or primary bone lesion. No discogenic endplate edema.  Cord: No cord compression or primary cord lesion. See below regarding stenosis.  Paraspinal and other soft tissues: Negative  Disc levels:  No abnormality from T1-2 through T2-3.  T3-4 through T6-7: Mild/minimal disc bulges  without compressive stenosis.  T7-8 through T11-12: Shallow disc herniations at these levels, effacing the ventral subarachnoid space but not compressing the cord. Ample subarachnoid space present dorsal to the cord. No foraminal extension of disc material. There is some facet degeneration and hypertrophy on the left at T8-9 and T9-10 and the left foramina at those 2 levels are somewhat stenotic.  T12-L1: Interspace.  IMPRESSION: Degenerative disc disease and degenerative facet disease throughout the thoracic region as outlined above. There are shallow disc herniations from T7-8 through T11-12, effacing the ventral subarachnoid space but not compressing the cord. Ample subarachnoid space present dorsal to the cord. No foraminal extension of disc material. There is some facet degeneration and hypertrophy on the left at T8-9 and T9-10 and the left foramina at those 2 levels are somewhat stenotic. The findings in general could certainly be associated with thoracic region back pain.   Electronically Signed   By: Nelson Chimes M.D.   On: 09/16/2020 08:45      PMFS History: Patient Active Problem List   Diagnosis Date Noted  . 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 Constipation 08/13/2020  . Calculus of gallbladder without cholecystitis without obstruction 12/17/2019  . Dyspepsia 11/26/2019  . Hyperlipemia 05/01/2018  . Morbid obesity ([place]) 05/01/2018  . GERD (gastroesophageal reflux disease) 02/22/2017  . Vitamin D deficiency 04/25/2016  . Essential hypertension, benign 07/05/2013   Past Medical History:  Diagnosis Date  . GERD (gastroesophageal reflux disease)   . Hypertension     Family History  Problem Relation Age of Onset  . Diabetes Mother   . Hypertension Mother   . Pulmonary embolism Sister   . Hypertension Other   . Diabetes Other     Past Surgical History:  Procedure Laterality Date  . CESAREAN SECTION    . CHOLECYSTECTOMY  N/A 12/25/2019   Procedure: LAPAROSCOPIC CHOLECYSTECTOMY WITH INTRAOPERATIVE CHOLANGIOGRAM;  Surgeon: Virl Cagey, MD;  Location: [facility];  Service: General;  Laterality: N/A;  . COLONOSCOPY N/A 12/04/2019    sigmoid diverticulosis.  Marland Kitchen ECTOPIC PREGNANCY SURGERY    . ESOPHAGOGASTRODUODENOSCOPY N/A 12/04/2019   normal esophagus, stomach, and duodenum.    Social History   Occupational History  . Not on file  Tobacco Use  . Smoking status: Never Smoker  . Smokeless tobacco: Never Used  Vaping Use  . Vaping Use: Never used  Substance and Sexual Activity  . Alcohol use: No  . Drug use: No  . Sexual activity: Yes    Birth control/protection: Surgical

## 2020-10-13 ENCOUNTER — Encounter: Payer: Self-pay | Admitting: Nurse Practitioner

## 2020-10-13 ENCOUNTER — Ambulatory Visit (INDEPENDENT_AMBULATORY_CARE_PROVIDER_SITE_OTHER): Payer: 59 | Admitting: Nurse Practitioner

## 2020-10-13 DIAGNOSIS — J40 Bronchitis, not specified as acute or chronic: Secondary | ICD-10-CM | POA: Diagnosis not present

## 2020-10-13 MED ORDER — PREDNISONE 20 MG PO TABS: ORAL_TABLET | ORAL | 0 refills | Status: DC

## 2020-10-13 MED ORDER — BENZONATATE 100 MG PO CAPS: 100.0000 mg | ORAL_CAPSULE | Freq: Three times a day (TID) | ORAL | 0 refills | Status: DC | PRN

## 2020-10-13 NOTE — Progress Notes (Signed)
Virtual Visit via telephone Note Due to COVID-19 pandemic this visit was conducted virtually. This visit type was conducted due to national recommendations for restrictions regarding the COVID-19 Pandemic (e.g. social distancing, sheltering in place) in an effort to limit this patient's exposure and mitigate transmission in our community. All issues noted in this document were discussed and addressed.  A physical exam was not performed with this format.  I connected with Sabrina [name] on 10/13/20 at 2:35 by telephone and verified that I am speaking with the correct person using two identifiers. Sabrina [name] is currently located at home and no one is currently with  her during visit. The provider, Mary-Margaret Hassell Done, FNP is located in their office at time of visit.  I discussed the limitations, risks, security and privacy concerns of performing an evaluation and management service by telephone and the availability of in person appointments. I also discussed with the patient that there may be a patient responsible charge related to this service. The patient expressed understanding and agreed to proceed.   History and Present Illness:   Chief Complaint: URI   HPI Patient calls in for a telephone visit stating that she has a bad cough that has bene lingering. She had it over christmas, went away and returned a few days ago. Did covid test Tuesday of last week and was negative. Her taste comes an goes. Denies body aches or fever.   Review of Systems  Constitutional: Negative for chills, fever and malaise/fatigue.  HENT: Negative for congestion and sore throat.   Respiratory: Positive for cough. Negative for sputum production and shortness of breath.   Musculoskeletal: Negative for myalgias.  Neurological: Negative for dizziness and headaches.     Observations/Objective: Alert and oriented- answers all questions appropriately No distress Voice hoarse Dry cough-  deep  Assessment and Plan: Sabrina [name] in today with chief complaint of URI   1. Bronchitis 1. Take meds as prescribed 2. Use a cool mist humidifier especially during the winter months and when heat has been humid. 3. Use saline nose sprays frequently 4. Saline irrigations of the nose can be very helpful if done frequently.  * 4X daily for 1 week*  * Use of a nettie pot can be helpful with this. Follow directions with this* 5. Drink plenty of fluids 6. Keep thermostat turn down low 7.For any cough or congestion- tessalon perles as rx 8. For fever or aces or pains- take tylenol or ibuprofen appropriate for age and weight.  * for fevers greater than 101 orally you may alternate ibuprofen and tylenol every  3 hours.   Meds ordered this encounter  Medications  . predniSONE (DELTASONE) 20 MG tablet    Sig: 2 po at sametime daily for 5 days    Dispense:  10 tablet    Refill:  0    Order Specific Question:   Supervising Provider    Answer:   Caryl Pina A [id number]  . benzonatate (TESSALON PERLES) 100 MG capsule    Sig: Take 1 capsule (100 mg total) by mouth 3 (three) times daily as needed.    Dispense:  20 capsule    Refill:  0    Order Specific Question:   Supervising Provider    Answer:   Caryl Pina A [[id number]]         Follow Up Instructions: prn    I discussed the assessment and treatment plan with the patient. The patient was provided an opportunity to ask questions and  all were answered. The patient agreed with the plan and demonstrated an understanding of the instructions.   The patient was advised to call back or seek an in-person evaluation if the symptoms worsen or if the condition fails to improve as anticipated.  The above assessment and management plan was discussed with the patient. The patient verbalized understanding of and has agreed to the management plan. Patient is aware to call the clinic if symptoms persist or worsen. Patient is aware  when to return to the clinic for a follow-up visit. Patient educated on when it is appropriate to go to the emergency department.   Time call ended:  2:49  I provided 14 minutes of non-face-to-face time during this encounter.    Mary-Margaret Hassell Done, FNP

## 2020-11-27 ENCOUNTER — Other Ambulatory Visit: Payer: Self-pay | Admitting: Family Medicine

## 2021-02-02 ENCOUNTER — Encounter: Payer: Self-pay | Admitting: Family Medicine

## 2021-05-19 ENCOUNTER — Other Ambulatory Visit: Payer: Self-pay | Admitting: Family

## 2021-05-19 DIAGNOSIS — Z1231 Encounter for screening mammogram for malignant neoplasm of breast: Secondary | ICD-10-CM

## 2021-06-29 ENCOUNTER — Ambulatory Visit: Payer: 59

## 2021-07-27 ENCOUNTER — Ambulatory Visit: Payer: 59

## 2021-08-16 ENCOUNTER — Encounter: Payer: Self-pay | Admitting: Family

## 2021-08-16 ENCOUNTER — Ambulatory Visit (INDEPENDENT_AMBULATORY_CARE_PROVIDER_SITE_OTHER): Payer: 59 | Admitting: Family

## 2021-08-16 ENCOUNTER — Other Ambulatory Visit (HOSPITAL_COMMUNITY)
Admission: RE | Admit: 2021-08-16 | Discharge: 2021-08-16 | Disposition: A | Discharge status: Home / Self Care | Payer: 59 | Source: Ambulatory Visit | Attending: Family | Admitting: Family

## 2021-08-16 ENCOUNTER — Other Ambulatory Visit: Payer: Self-pay

## 2021-08-16 VITALS — BP 141/88 | HR 77 | Temp 98.1°F | Ht 66.0 in | Wt 209.6 lb

## 2021-08-16 DIAGNOSIS — Z01419 Encounter for gynecological examination (general) (routine) without abnormal findings: Secondary | ICD-10-CM | POA: Insufficient documentation

## 2021-08-16 DIAGNOSIS — E785 Hyperlipidemia, unspecified: Secondary | ICD-10-CM

## 2021-08-16 DIAGNOSIS — E559 Vitamin D deficiency, unspecified: Secondary | ICD-10-CM

## 2021-08-16 DIAGNOSIS — K219 Gastro-esophageal reflux disease without esophagitis: Secondary | ICD-10-CM

## 2021-08-16 DIAGNOSIS — Z0000 Encounter for general adult medical examination without abnormal findings: Secondary | ICD-10-CM | POA: Insufficient documentation

## 2021-08-16 DIAGNOSIS — G44209 Tension-type headache, unspecified, not intractable: Secondary | ICD-10-CM

## 2021-08-16 DIAGNOSIS — Z0001 Encounter for general adult medical examination with abnormal findings: Secondary | ICD-10-CM

## 2021-08-16 DIAGNOSIS — I10 Essential (primary) hypertension: Secondary | ICD-10-CM | POA: Diagnosis not present

## 2021-08-16 DIAGNOSIS — M5134 Other intervertebral disc degeneration, thoracic region: Secondary | ICD-10-CM

## 2021-08-16 DIAGNOSIS — E669 Obesity, unspecified: Secondary | ICD-10-CM

## 2021-08-16 MED ORDER — VITAMIN D (ERGOCALCIFEROL) 1.25 MG (50000 UNIT) PO CAPS: 50000.0000 [IU] | ORAL_CAPSULE | ORAL | 3 refills | Status: DC

## 2021-08-16 MED ORDER — HYDROCHLOROTHIAZIDE 12.5 MG PO TABS: 12.5000 mg | ORAL_TABLET | Freq: Every day | ORAL | 0 refills | Status: DC | PRN

## 2021-08-16 MED ORDER — AMLODIPINE BESYLATE 5 MG PO TABS: 5.0000 mg | ORAL_TABLET | Freq: Every day | ORAL | 3 refills | Status: DC

## 2021-08-16 MED ORDER — OMEPRAZOLE 20 MG PO CPDR: 20.0000 mg | DELAYED_RELEASE_CAPSULE | Freq: Two times a day (BID) | ORAL | 2 refills | Status: DC

## 2021-08-16 MED ORDER — SUMATRIPTAN SUCCINATE 25 MG PO TABS: ORAL_TABLET | ORAL | 0 refills | Status: DC

## 2021-08-16 NOTE — Patient Instructions (Signed)

## 2021-08-16 NOTE — Progress Notes (Signed)
Subjective:    Patient ID: Sabrina [name], female    DOB: 1971/08/06, 50 y.o.   MRN: [id number]  Chief Complaint  Patient presents with   Annual Exam   Pt presents to the office today for CPE with pap. She had her gallbladder removed 12/25/19.  Hypertension This is a chronic problem. The current episode started more than 1 year ago. The problem has been waxing and waning since onset. The problem is uncontrolled. Pertinent negatives include no malaise/fatigue, peripheral edema or shortness of breath. Risk factors for coronary artery disease include dyslipidemia and obesity. The current treatment provides moderate improvement.  Gastroesophageal Reflux She complains of belching. This is a chronic problem. The current episode started more than 1 year ago. The problem occurs occasionally. Risk factors include obesity. She has tried a PPI for the symptoms. The treatment provided moderate relief.  Hyperlipidemia Pertinent negatives include no shortness of breath. Current antihyperlipidemic treatment includes diet change. The current treatment provides mild improvement of lipids. Risk factors for coronary artery disease include hypertension, dyslipidemia, post-menopausal and a sedentary lifestyle.  Back Pain This is a chronic problem. The current episode started more than 1 year ago. The problem has been waxing and waning since onset. The pain is present in the thoracic spine. The pain is at a severity of 0/10 (10 when it hurts).  Gynecologic Exam The patient's pertinent negatives include no genital itching, genital odor, vaginal bleeding or vaginal discharge. Associated symptoms include back pain and constipation.     Review of Systems  Constitutional:  Negative for malaise/fatigue.  Respiratory:  Negative for shortness of breath.   Gastrointestinal:  Positive for constipation.  Genitourinary:  Negative for vaginal discharge.  Musculoskeletal:  Positive for back pain.  All other systems  reviewed and are negative.     Objective:   Physical Exam Vitals reviewed.  Constitutional:      General: She is not in acute distress.    Appearance: She is well-developed. She is obese.  HENT:     Head: Normocephalic and atraumatic.     Right Ear: Tympanic membrane normal.     Left Ear: Tympanic membrane normal.  Eyes:     Pupils: Pupils are equal, round, and reactive to light.  Neck:     Thyroid: No thyromegaly.  Cardiovascular:     Rate and Rhythm: Normal rate and regular rhythm.     Heart sounds: Normal heart sounds. No murmur heard. Pulmonary:     Effort: Pulmonary effort is normal. No respiratory distress.     Breath sounds: Normal breath sounds. No wheezing.  Chest:  Breasts:    Right: No swelling, bleeding, inverted nipple, mass, nipple discharge, skin change or tenderness.     Left: No swelling, bleeding, inverted nipple, mass, nipple discharge, skin change or tenderness.  Abdominal:     General: Bowel sounds are normal. There is no distension.     Palpations: Abdomen is soft.     Tenderness: There is no abdominal tenderness.  Genitourinary:    General: Normal vulva.     Comments: Bimanual exam- no adnexal masses or tenderness, ovaries nonpalpable   Cervix parous and pink- white discharge  Musculoskeletal:        General: No tenderness. Normal range of motion.     Cervical back: Normal range of motion and neck supple.  Skin:    General: Skin is warm and dry.  Neurological:     Mental Status: She is alert and oriented to person, place,  and time.     Cranial Nerves: No cranial nerve deficit.     Deep Tendon Reflexes: Reflexes are normal and symmetric.  Psychiatric:        Behavior: Behavior normal.        Thought Content: Thought content normal.        Judgment: Judgment normal.      BP (!) 141/88   Pulse 77   Temp 98.1 F (36.7 C) (Temporal)   Ht _0  (1.676 m)   Wt 209 lb 9.6 oz (95.1 kg)   LMP 07/22/2021 Comment: 07/22/21-08/10/21  BMI 33.83  kg/m      Assessment & Plan:  Sabrina [name] comes in today with chief complaint of Annual Exam   Diagnosis and orders addressed:  1. Essential hypertension, benign - amLODipine (NORVASC) 5 MG tablet; Take 1 tablet (5 mg total) by mouth daily.  Dispense: 90 tablet; Refill: 3 - hydrochlorothiazide (HYDRODIURIL) 12.5 MG tablet; Take 1 tablet (12.5 mg total) by mouth daily as needed (fluid).  Dispense: 90 tablet; Refill: 0 - CMP14+EGFR  2. Tension headache - SUMAtriptan (IMITREX) 25 MG tablet; TAKE 1 TABLET (25 MG) TOTAL BY MOUTH ONCE FOR 1 DOSE. MAY REPEAT IN 2 HOURS IF HEADACHE PERSISTS OR RECURS  Dispense: 10 tablet; Refill: 0 - CMP14+EGFR  3. Gastroesophageal reflux disease, unspecified whether esophagitis present - CMP14+EGFR  4. Obesity (BMI 30-39.9) - CMP14+EGFR  5. Hyperlipidemia, unspecified hyperlipidemia type - CMP14+EGFR - Lipid panel  6. Vitamin D deficiency - Vitamin D, Ergocalciferol, (DRISDOL) 1.25 MG (50000 UNIT) CAPS capsule; Take 1 capsule (50,000 Units total) by mouth every 7 (seven) days.  Dispense: 12 capsule; Refill: 3 - CMP14+EGFR - VITAMIN D 25 Hydroxy (Vit-D Deficiency, Fractures)  7. 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 Annual physical exam - CMP14+EGFR - Lipid panel - TSH - VITAMIN D 25 Hydroxy (Vit-D Deficiency, Fractures) - Cytology - PAP([place]) - Anemia Profile B  9. Gynecologic exam normal - CMP14+EGFR - Cytology - PAP([place])   Labs pending Health Maintenance reviewed Diet and exercise encouraged  Follow up plan: 6 months    Evelina Dun, FNP

## 2021-08-17 LAB — ANEMIA PROFILE B
Basophils Absolute: 0 10*3/uL (ref 0.0–0.2)
Basos: 1 %
EOS (ABSOLUTE): 0.1 10*3/uL (ref 0.0–0.4)
Eos: 1 %
Ferritin: 38 ng/mL (ref 15–150)
Folate: 19 ng/mL (ref >3.0)
Hematocrit: 38 % (ref 34.0–46.6)
Hemoglobin: 13.3 g/dL (ref 11.1–15.9)
Immature Grans (Abs): 0 10*3/uL (ref 0.0–0.1)
Immature Granulocytes: 0 %
Iron Saturation: 23 % (ref 15–55)
Iron: 83 ug/dL (ref 27–159)
Lymphocytes Absolute: 1.9 10*3/uL (ref 0.7–3.1)
Lymphs: 37 %
MCH: 31.2 pg (ref 26.6–33.0)
MCHC: 35 g/dL (ref 31.5–35.7)
MCV: 89 fL (ref 79–97)
Monocytes Absolute: 0.4 10*3/uL (ref 0.1–0.9)
Monocytes: 8 %
Neutrophils Absolute: 2.8 10*3/uL (ref 1.4–7.0)
Neutrophils: 53 %
Platelets: 286 10*3/uL (ref 150–450)
RBC: 4.26 x10E6/uL (ref 3.77–5.28)
RDW: 10.4 % — ABNORMAL LOW (ref 11.7–15.4)
Retic Ct Pct: 2.2 % (ref 0.6–2.6)
Total Iron Binding Capacity: 355 ug/dL (ref 250–450)
UIBC: 272 ug/dL (ref 131–425)
Vitamin B-12: 644 pg/mL (ref 232–1245)
WBC: 5.2 10*3/uL (ref 3.4–10.8)

## 2021-08-17 LAB — CMP14+EGFR
ALT: 40 IU/L — ABNORMAL HIGH (ref 0–32)
AST: 24 IU/L (ref 0–40)
Albumin/Globulin Ratio: 2.1 (ref 1.2–2.2)
Albumin: 4.5 g/dL (ref 3.8–4.8)
Alkaline Phosphatase: 77 IU/L (ref 44–121)
BUN/Creatinine Ratio: 10 (ref 9–23)
BUN: 10 mg/dL (ref 6–24)
Bilirubin Total: 0.3 mg/dL (ref 0.0–1.2)
CO2: 22 mmol/L (ref 20–29)
Calcium: 10.7 mg/dL — ABNORMAL HIGH (ref 8.7–10.2)
Chloride: 105 mmol/L (ref 96–106)
Creatinine, Ser: 0.97 mg/dL (ref 0.57–1.00)
Globulin, Total: 2.1 g/dL (ref 1.5–4.5)
Glucose: 86 mg/dL (ref 70–99)
Potassium: 4.4 mmol/L (ref 3.5–5.2)
Sodium: 140 mmol/L (ref 134–144)
Total Protein: 6.6 g/dL (ref 6.0–8.5)
eGFR: 72 mL/min/{1.73_m2} (ref >59)

## 2021-08-17 LAB — LIPID PANEL
Chol/HDL Ratio: 3.6 ratio (ref 0.0–4.4)
Cholesterol, Total: 196 mg/dL (ref 100–199)
HDL: 54 mg/dL (ref >39)
LDL Chol Calc (NIH): 127 mg/dL — ABNORMAL HIGH (ref 0–99)
Triglycerides: 82 mg/dL (ref 0–149)
VLDL Cholesterol Cal: 15 mg/dL (ref 5–40)

## 2021-08-17 LAB — VITAMIN D 25 HYDROXY (VIT D DEFICIENCY, FRACTURES): Vit D, 25-Hydroxy: 44.3 ng/mL (ref 30.0–100.0)

## 2021-08-17 LAB — TSH: TSH: 2.82 u[IU]/mL (ref 0.450–4.500)

## 2021-08-18 ENCOUNTER — Telehealth: Payer: Self-pay | Admitting: Family

## 2021-08-18 NOTE — Telephone Encounter (Signed)
Patient thought you were also going to call in vit b12 and iron

## 2021-08-18 NOTE — Telephone Encounter (Signed)
PLEASE REVIEW.

## 2021-08-19 NOTE — Telephone Encounter (Signed)
Patient aware and verbalized understanding. Patient asking if you know what the fatigue might be

## 2021-08-19 NOTE — Telephone Encounter (Signed)
Anemia Profile (Iron levels, Vitamin B12, Folate, WBC, Hgb, & Plts)-  Stable Vit D levels normal  Thyroid levels normal  LDL elevated,her 10-year ASCVD risk score (Arnett DK, et al., 2019) is: 4.5%- Low fat diet and exercise  Thyroid levels normal   Will hold off on iron and Vit B 12 since levels normal.

## 2021-08-23 NOTE — Telephone Encounter (Signed)
Nothing from her lab would cause her fatigue that I saw. I recommend changing diet and increase exercise. Make sure you are drinking lots of water.

## 2021-08-23 NOTE — Telephone Encounter (Signed)
Pt aware of provider feedback. 

## 2021-08-24 ENCOUNTER — Other Ambulatory Visit: Payer: Self-pay | Admitting: Family

## 2021-08-24 LAB — CYTOLOGY - PAP
Chlamydia: NEGATIVE
Comment: NEGATIVE
Comment: NEGATIVE
Comment: NEGATIVE
Comment: NORMAL
Diagnosis: NEGATIVE
HSV1: NEGATIVE
HSV2: NEGATIVE
Neisseria Gonorrhea: NEGATIVE
Trichomonas: NEGATIVE

## 2021-08-24 MED ORDER — FLUCONAZOLE 150 MG PO TABS: 150.0000 mg | ORAL_TABLET | ORAL | 0 refills | Status: DC | PRN

## 2021-08-25 ENCOUNTER — Telehealth: Payer: Self-pay | Admitting: Family

## 2021-08-25 NOTE — Telephone Encounter (Signed)
Patient aware she yeast was on pap she is not having any symptoms. She is not going to pick up until she has symptoms.

## 2021-09-01 ENCOUNTER — Other Ambulatory Visit: Payer: Self-pay

## 2021-09-01 ENCOUNTER — Ambulatory Visit
Admission: RE | Admit: 2021-09-01 | Discharge: 2021-09-01 | Disposition: A | Discharge status: Home / Self Care | Payer: 59 | Source: Ambulatory Visit | Attending: Family | Admitting: Family

## 2021-09-01 DIAGNOSIS — Z1231 Encounter for screening mammogram for malignant neoplasm of breast: Secondary | ICD-10-CM

## 2021-10-21 ENCOUNTER — Other Ambulatory Visit: Payer: Self-pay

## 2021-10-21 ENCOUNTER — Encounter (HOSPITAL_COMMUNITY): Payer: Self-pay

## 2021-10-21 ENCOUNTER — Emergency Department (HOSPITAL_COMMUNITY)
Admission: EM | Admit: 2021-10-21 | Discharge: 2021-10-21 | Disposition: A | Discharge status: Home / Self Care | Payer: 59 | Attending: Emergency Medicine | Admitting: Emergency Medicine

## 2021-10-21 DIAGNOSIS — Z20822 Contact with and (suspected) exposure to covid-19: Secondary | ICD-10-CM | POA: Insufficient documentation

## 2021-10-21 DIAGNOSIS — R531 Weakness: Secondary | ICD-10-CM | POA: Insufficient documentation

## 2021-10-21 DIAGNOSIS — R300 Dysuria: Secondary | ICD-10-CM | POA: Insufficient documentation

## 2021-10-21 DIAGNOSIS — R519 Headache, unspecified: Secondary | ICD-10-CM | POA: Insufficient documentation

## 2021-10-21 LAB — CBC WITH DIFFERENTIAL/PLATELET
Abs Immature Granulocytes: 0.01 10*3/uL (ref 0.00–0.07)
Basophils Absolute: 0 10*3/uL (ref 0.0–0.1)
Basophils Relative: 1 %
Eosinophils Absolute: 0.1 10*3/uL (ref 0.0–0.5)
Eosinophils Relative: 1 %
HCT: 39 % (ref 36.0–46.0)
Hemoglobin: 12.7 g/dL (ref 12.0–15.0)
Immature Granulocytes: 0 %
Lymphocytes Relative: 28 %
Lymphs Abs: 1.7 10*3/uL (ref 0.7–4.0)
MCH: 30.6 pg (ref 26.0–34.0)
MCHC: 32.6 g/dL (ref 30.0–36.0)
MCV: 94 fL (ref 80.0–100.0)
Monocytes Absolute: 0.4 10*3/uL (ref 0.1–1.0)
Monocytes Relative: 7 %
Neutro Abs: 3.7 10*3/uL (ref 1.7–7.7)
Neutrophils Relative %: 63 %
Platelets: 255 10*3/uL (ref 150–400)
RBC: 4.15 MIL/uL (ref 3.87–5.11)
RDW: 11.3 % — ABNORMAL LOW (ref 11.5–15.5)
WBC: 5.9 10*3/uL (ref 4.0–10.5)
nRBC: 0 % (ref 0.0–0.2)

## 2021-10-21 LAB — RESP PANEL BY RT-PCR (FLU A&B, COVID) ARPGX2
Influenza A by PCR: NEGATIVE
Influenza B by PCR: NEGATIVE
SARS Coronavirus 2 by RT PCR: NEGATIVE

## 2021-10-21 LAB — COMPREHENSIVE METABOLIC PANEL
ALT: 52 U/L — ABNORMAL HIGH (ref 0–44)
AST: 30 U/L (ref 15–41)
Albumin: 3.7 g/dL (ref 3.5–5.0)
Alkaline Phosphatase: 58 U/L (ref 38–126)
Anion gap: 6 (ref 5–15)
BUN: 9 mg/dL (ref 6–20)
CO2: 26 mmol/L (ref 22–32)
Calcium: 10.6 mg/dL — ABNORMAL HIGH (ref 8.9–10.3)
Chloride: 105 mmol/L (ref 98–111)
Creatinine, Ser: 0.91 mg/dL (ref 0.44–1.00)
GFR, Estimated: >60 mL/min (ref >60)
Glucose, Bld: 92 mg/dL (ref 70–99)
Potassium: 4.1 mmol/L (ref 3.5–5.1)
Sodium: 137 mmol/L (ref 135–145)
Total Bilirubin: 0.6 mg/dL (ref 0.3–1.2)
Total Protein: 6.5 g/dL (ref 6.5–8.1)

## 2021-10-21 LAB — URINALYSIS, ROUTINE W REFLEX MICROSCOPIC
Bilirubin Urine: NEGATIVE
Glucose, UA: NEGATIVE mg/dL
Ketones, ur: NEGATIVE mg/dL
Leukocytes,Ua: NEGATIVE
Nitrite: NEGATIVE
Protein, ur: NEGATIVE mg/dL
Specific Gravity, Urine: >1.03 — ABNORMAL HIGH (ref 1.005–1.030)
pH: 6 (ref 5.0–8.0)

## 2021-10-21 LAB — URINALYSIS, MICROSCOPIC (REFLEX)

## 2021-10-21 LAB — LIPASE, BLOOD: Lipase: 27 U/L (ref 11–51)

## 2021-10-21 LAB — PREGNANCY, URINE: Preg Test, Ur: NEGATIVE

## 2021-10-21 MED ORDER — ACETAMINOPHEN 500 MG PO TABS
1000.0000 mg | ORAL_TABLET | Freq: Once | ORAL | Status: AC
Start: 2021-10-21 — End: 2021-10-21
  Administered 2021-10-21: 1000 mg via ORAL
  Filled 2021-10-21: qty 2

## 2021-10-21 MED ORDER — CEPHALEXIN 500 MG PO CAPS: 500.0000 mg | ORAL_CAPSULE | Freq: Three times a day (TID) | ORAL | 0 refills | Status: AC

## 2021-10-21 NOTE — ED Provider Triage Note (Signed)
Emergency Medicine Provider Triage Evaluation Note  Sabrina [name] , a 51 y.o. female  was evaluated in triage.  Pt complains of malaise, headaches and concerns for UTI.  Patient states she has been having intermittent sharp headaches in the back of her head.  She is not currently having any headache now.  She has felt very fatigued.  She had some pain in the left flank yesterday and was worried she could be getting a urinary tract infection or might becoming dehydrated.  She is not having any vomiting although she is having some nausea.  No fevers or chills.  No chest pain or shortness of breath.  No numbness or weakness.  Review of Systems  Positive: Fatigue Negative: Fever  Physical Exam  BP 136/87 (BP Location: Right Arm)    Pulse 70    Temp 98.3 F (36.8 C) (Oral)    Resp 20    Ht 1.676 m (5\' 6" )    Wt 95.3 kg    LMP 10/14/2021 (Approximate)    SpO2 99%    BMI 33.89 kg/m  Gen:   Awake, no distress   Resp:  Normal effort  MSK:   Moves extremities without difficulty  Other:  Normal strength and sensation all extremities, no facial droop  Medical Decision Making  Medically screening exam initiated at 8:34 AM.  Appropriate orders placed.  Manvir Thorson was informed that the remainder of the evaluation will be completed by another provider, this initial triage assessment does not replace that evaluation, and the importance of remaining in the ED until their evaluation is complete.     Dorie Rank, MD 10/21/21 [phone]

## 2021-10-21 NOTE — ED Notes (Signed)
Pt d/c home per MD order. Discharge summary reviewed, pt verbalizes understanding. Ambulatory off unit. No s/s of acute distress noted at discharge.  °

## 2021-10-21 NOTE — ED Provider Notes (Signed)
[facility] EMERGENCY DEPARTMENT Provider Note   CSN: [id number] Arrival date & time: 10/21/21  0800     History  Chief Complaint  Patient presents with   Headache    Sabrina [name] is a 51 y.o. female presenting for evaluation of generalized weakness and headache.  Patient states she has been feeling generally weak for the past several days.  She was having a heavy and long period, which she attributed her weakness to initially.  However she then developed intermittent headaches.  She is not having headache currently.  Her period ended yesterday, but continues to feel tired.  She also reports dysuria that started yesterday, no urinary frequency or hematuria.  She has been having some mild soft stools for the past several days, however her bowel movement last night was normal.   She denies fevers, nasal congestion, sore throat, cough, nausea, vomiting, abdominal pain.   Of note, patient's daughter was COVID-positive last week.  HPI     Home Medications Prior to Admission medications   Medication Sig Start Date End Date Taking? Authorizing Provider  amLODipine (NORVASC) 5 MG tablet Take 1 tablet (5 mg total) by mouth daily. 08/16/21  Yes Hawks, Christy A, FNP  cephALEXin (KEFLEX) 500 MG capsule Take 1 capsule (500 mg total) by mouth 3 (three) times daily for 5 days. 10/21/21 10/26/21 Yes Jayah Balthazar, PA-C  hydrochlorothiazide (HYDRODIURIL) 12.5 MG tablet Take 1 tablet (12.5 mg total) by mouth daily as needed (fluid). 08/16/21  Yes Hawks, Christy A, FNP  omeprazole (PRILOSEC) 20 MG capsule Take 1 capsule (20 mg total) by mouth in the morning and at bedtime. 08/16/21  Yes Hawks, Christy A, FNP  ondansetron (ZOFRAN-ODT) 4 MG disintegrating tablet Take 1 tablet (4 mg total) by mouth every 8 (eight) hours as needed for nausea or vomiting. 12/25/19  Yes Virl Cagey, MD  SUMAtriptan (IMITREX) 25 MG tablet TAKE 1 TABLET (25 MG) TOTAL BY MOUTH ONCE FOR 1 DOSE. MAY REPEAT IN 2 HOURS  IF HEADACHE PERSISTS OR RECURS Patient taking differently: Take 25 mg by mouth every 2 (two) hours as needed for migraine or headache. 08/16/21  Yes Hawks, Christy A, FNP  Vitamin D, Ergocalciferol, (DRISDOL) 1.25 MG (50000 UNIT) CAPS capsule Take 1 capsule (50,000 Units total) by mouth every 7 (seven) days. 08/16/21  Yes Hawks, Christy A, FNP  diclofenac (VOLTAREN) 75 MG EC tablet Take 1 tablet (75 mg total) by mouth 2 (two) times daily. Patient not taking: Reported on 10/21/2021 08/19/20   [name], FNP  fluconazole (DIFLUCAN) 150 MG tablet Take 1 tablet (150 mg total) by mouth every three (3) days as needed. Patient not taking: Reported on 10/21/2021 08/24/21   [name], FNP  methocarbamol (ROBAXIN) 500 MG tablet Take 1 tablet (500 mg total) by mouth 3 (three) times daily as needed for muscle spasms. Patient not taking: Reported on 08/16/2021 08/13/20   [name], FNP      Allergies    Patient has no known allergies.    Review of Systems   Review of Systems  Genitourinary:  Positive for dysuria.  Neurological:  Positive for weakness and headaches.   Physical Exam Updated Vital Signs BP 137/84 (BP Location: Right Arm)    Pulse 69    Temp 98.3 F (36.8 C) (Oral)    Resp 20    Ht 5\' 6"  (1.676 m)    Wt 95.3 kg    LMP 10/14/2021 (Approximate)    SpO2 100%  BMI 33.89 kg/m  Physical Exam Vitals and nursing note reviewed.  Constitutional:      General: She is not in acute distress.    Appearance: Normal appearance.     Comments: Resting in the bed in NAD  HENT:     Head: Normocephalic and atraumatic.  Eyes:     Conjunctiva/sclera: Conjunctivae normal.     Pupils: Pupils are equal, round, and reactive to light.  Cardiovascular:     Rate and Rhythm: Normal rate and regular rhythm.     Pulses: Normal pulses.  Pulmonary:     Effort: Pulmonary effort is normal. No respiratory distress.     Breath sounds: Normal breath sounds. No wheezing.     Comments: Speaking  in full sentences.  Clear lung sounds in all fields. Abdominal:     General: There is no distension.     Palpations: Abdomen is soft. There is no mass.     Tenderness: There is no abdominal tenderness. There is no right CVA tenderness, left CVA tenderness, guarding or rebound.     Comments: No TTP of the abdomen.  No rigidity, guarding, distention.  Negative rebound.  No CVA tenderness  Musculoskeletal:        General: Normal range of motion.     Cervical back: Normal range of motion and neck supple.  Skin:    General: Skin is warm and dry.     Capillary Refill: Capillary refill takes less than 2 seconds.  Neurological:     Mental Status: She is alert and oriented to person, place, and time.  Psychiatric:        Mood and Affect: Mood and affect normal.        Speech: Speech normal.        Behavior: Behavior normal.    ED Results / Procedures / Treatments   Labs (all labs ordered are listed, but only abnormal results are displayed) Labs Reviewed  COMPREHENSIVE METABOLIC PANEL - Abnormal; Notable for the following components:      Result Value   Calcium 10.6 (*)    ALT 52 (*)    All other components within normal limits  CBC WITH DIFFERENTIAL/PLATELET - Abnormal; Notable for the following components:   RDW 11.3 (*)    All other components within normal limits  URINALYSIS, ROUTINE W REFLEX MICROSCOPIC - Abnormal; Notable for the following components:   Specific Gravity, Urine >1.030 (*)    Hgb urine dipstick SMALL (*)    All other components within normal limits  URINALYSIS, MICROSCOPIC (REFLEX) - Abnormal; Notable for the following components:   Bacteria, UA MANY (*)    All other components within normal limits  RESP PANEL BY RT-PCR (FLU A&B, COVID) ARPGX2  URINE CULTURE  LIPASE, BLOOD  PREGNANCY, URINE    EKG None  Radiology No results found.  Procedures Procedures    Medications Ordered in ED Medications  acetaminophen (TYLENOL) tablet 1,000 mg (1,000 mg Oral  Given 10/21/21 1127)    ED Course/ Medical Decision Making/ A&P                           Medical Decision Making Amount and/or Complexity of Data Reviewed Labs: ordered.  Risk OTC drugs. Prescription drug management.    This patient presents to the ED for concern of headache, generalized weakness, dysuria. This involves a number of treatment options, and is a complaint that carries with it a moderate risk of complications  and morbidity.  The differential diagnosis includes uti, std, anemia, electrolyte abnormality, viral illness  Lab Tests: Labs obtained from triage independently interpreted by me.  Overall reassuring.  No anemia.  No electrolyte abnormality.  No abnormality to kidney function to indicate dehydration.  No leukocytosis.  Urine, COVID, and flu are pending  COVID and flu negative.  Urine does show some small blood and many bacteria.  In the setting of dysuria, this could be consistent with infection, though there are no leukocytes or nitrates.  Sent for culture.  Discussed findings with patient.  Discussed equivocal urine, however she is having symptoms, will treat with antibiotics.  Discussed that she does have pending urine culture.  Patient is not concerned for STDs or other pelvic infections.   Medicines ordered:  I ordered medication including Tylenol for headache  Disposition:  After consideration of the diagnostic results and the patients response to treatment, I feel that the patent would benefit from outpatient management and follow-up with PCP.  Discussed results with patient.  Discussed reassuring blood work.  Discussed equivocal urine and plan for treatment with antibiotics.  Discussed that this could also be a viral illness, just not COVID or flu.  Encouraged her to follow-up with her PCP next week for recheck of symptoms.  At this time, patient appears safe for discharge.  Return precautions given.  Patient states she understands and agrees to  plan   Final Clinical Impression(s) / ED Diagnoses Final diagnoses:  Dysuria  Acute nonintractable headache, unspecified headache type  General weakness    Rx / DC Orders ED Discharge Orders          Ordered    cephALEXin (KEFLEX) 500 MG capsule  3 times daily        10/21/21 1235              Kristoph Sattler, PA-C 10/21/21 1254    Dorie Rank, MD 10/22/21 [phone]

## 2021-10-21 NOTE — ED Triage Notes (Signed)
Patient complaining of headache and feeling tired since Saturday. Denies headache or pain currently but is tired.

## 2021-10-21 NOTE — Discharge Instructions (Signed)
Take antibiotics as prescribed. Take the entire course, even if symptoms improve.  Use tylenol or ibuprofen as needed for pain.  Make sure you stay well hydrated with water- your urine should be clear to pale yellow.  Follow up with your primary care doctor next week if your symptoms are not improving.

## 2021-10-22 LAB — URINE CULTURE: Culture: <10000 — AB

## 2021-10-25 ENCOUNTER — Other Ambulatory Visit: Payer: Self-pay | Admitting: Family

## 2021-10-25 MED ORDER — FLUCONAZOLE 150 MG PO TABS: 150.0000 mg | ORAL_TABLET | ORAL | 0 refills | Status: DC | PRN

## 2022-07-05 IMAGING — DX DG THORACIC SPINE 2V
2 series · 2 of 2 positions shown · non-contrast
Comparison: Chest x-ray 10/09/2012.

CLINICAL DATA: Neck and upper back pain.

EXAM:
THORACIC SPINE 2 VIEWS

[t-spine ap]
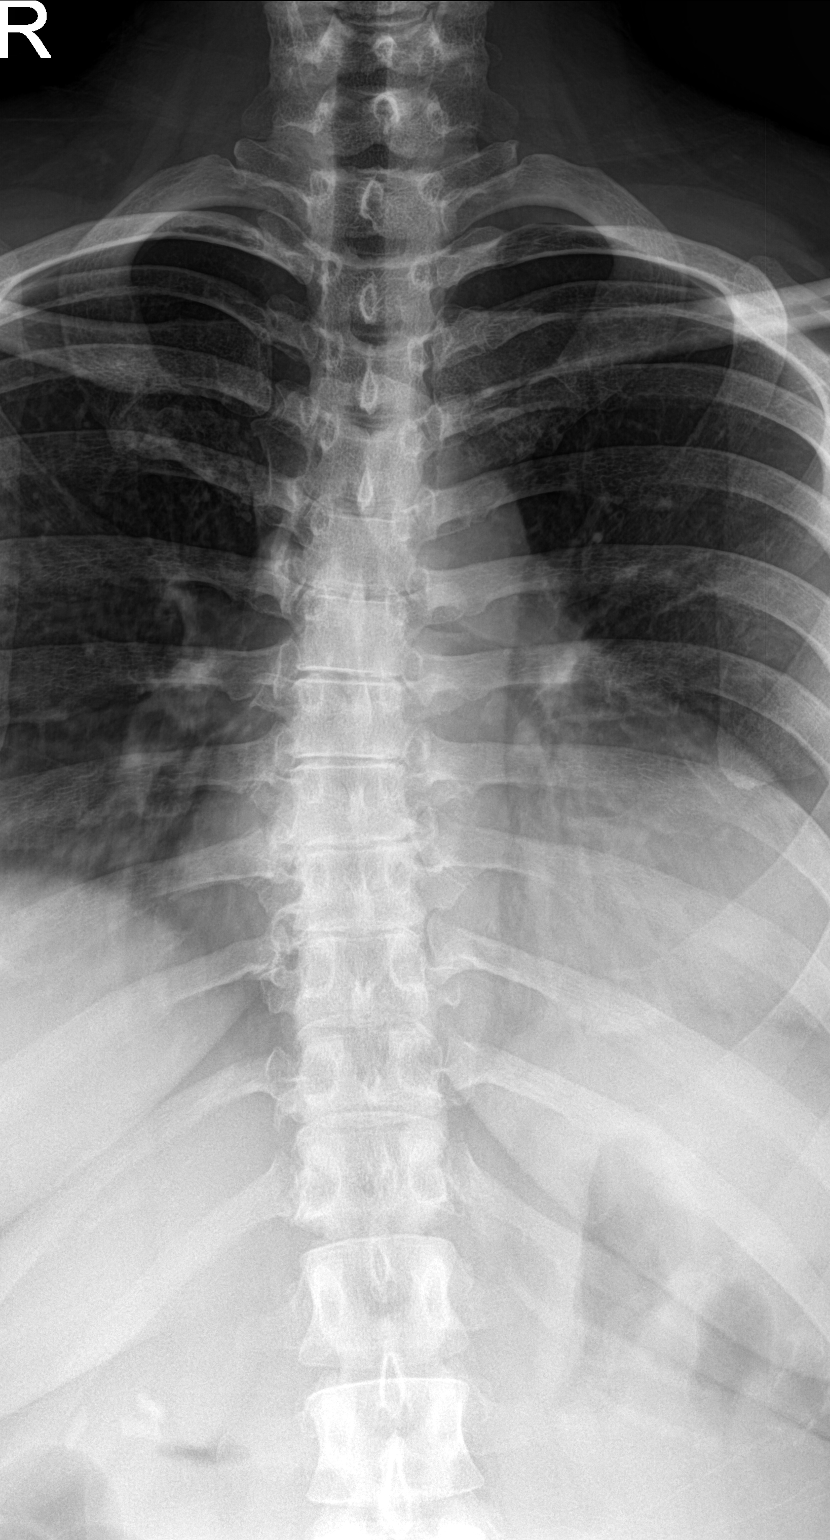

[t-spine lat]
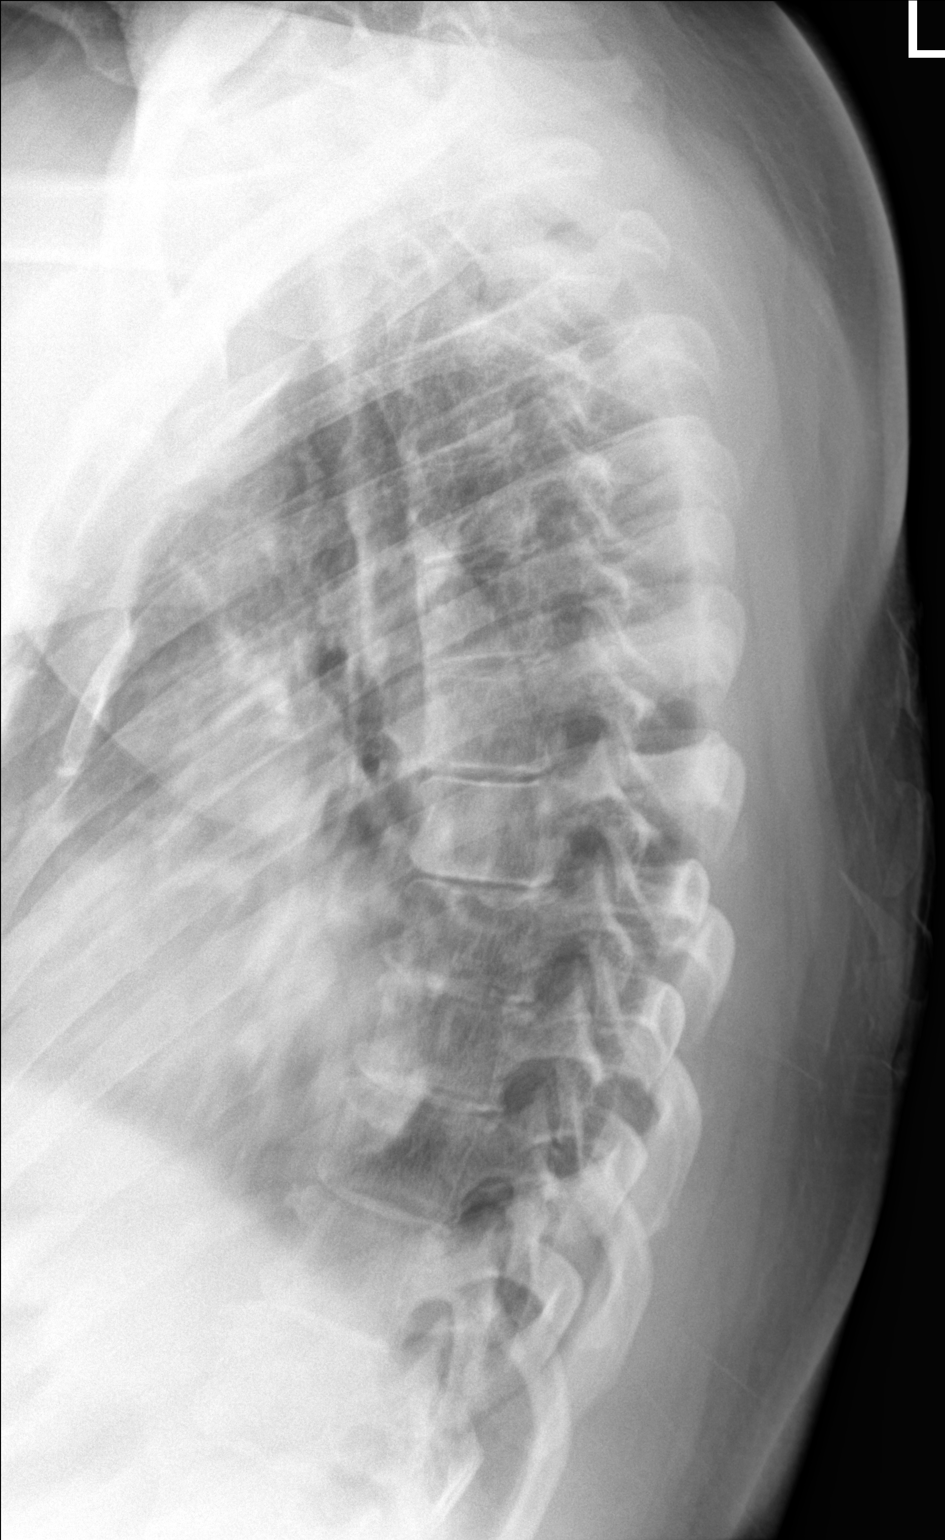

[2 of 2 positions shown; findings below may reference images not displayed]

FINDINGS: Mild thoracic spine scoliosis concave left with diffuse multilevel
degenerative change. No acute bony abnormality identified.
Paraspinal soft tissues are unremarkable. Surgical clips right upper
quadrant.
IMPRESSION: Mild thoracic spine scoliosis concave left with diffuse multilevel
degenerative change. No acute bony abnormality identified.

## 2022-07-05 IMAGING — DX DG CERVICAL SPINE COMPLETE 4+V
5 series · 5 of 5 positions shown · non-contrast
Comparison: No prior.

CLINICAL DATA: Neck and upper back pain.

EXAM:
CERVICAL SPINE - COMPLETE 4+ VIEW

[c-spine lat]
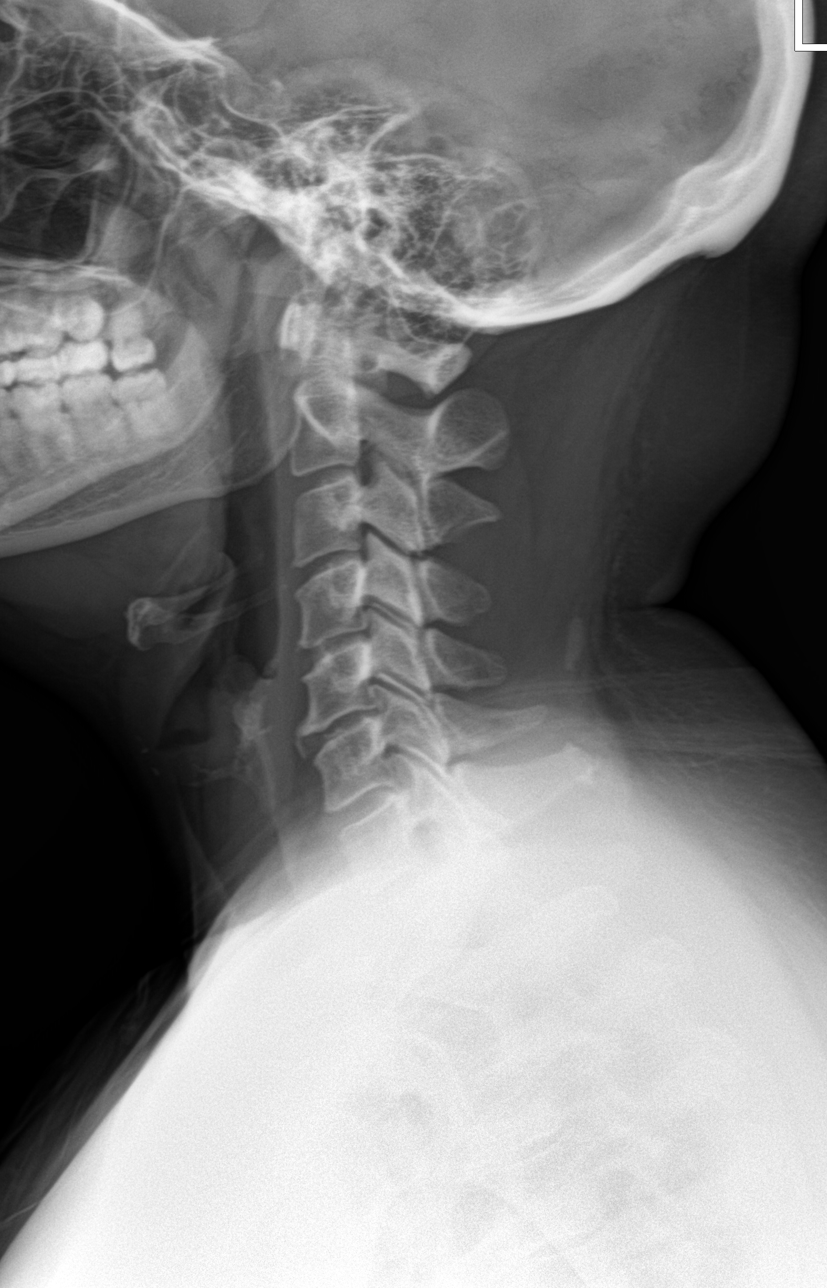

[c-spine obl (1 of 2)]
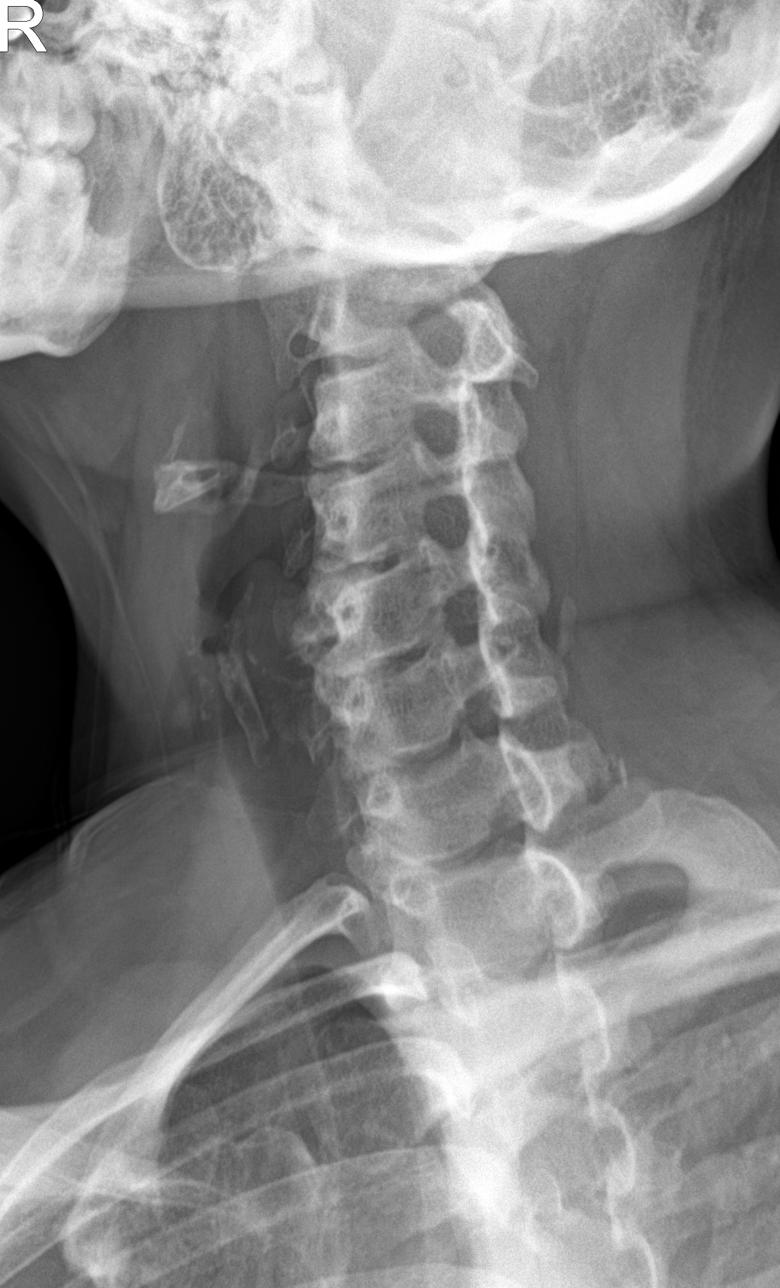

[c-spine obl (2 of 2)]
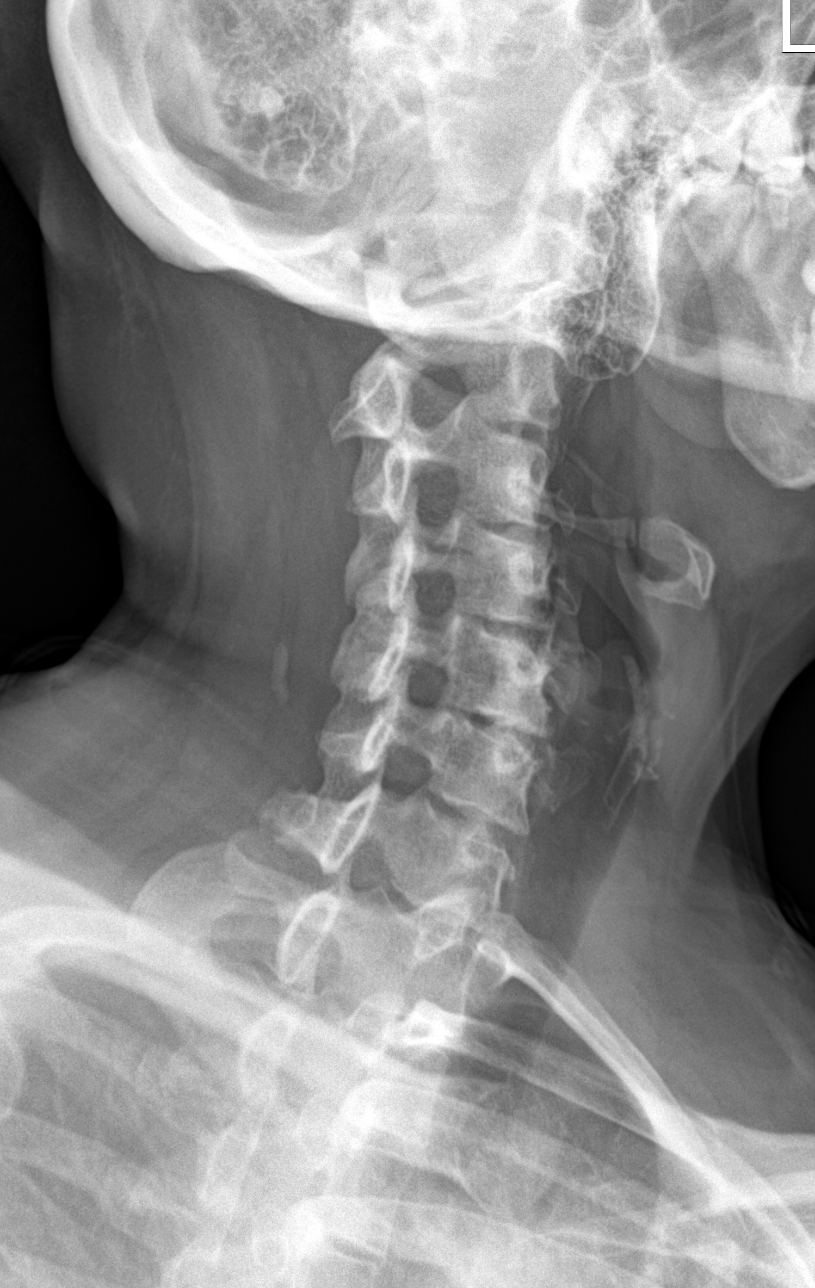

[c-spine ap]
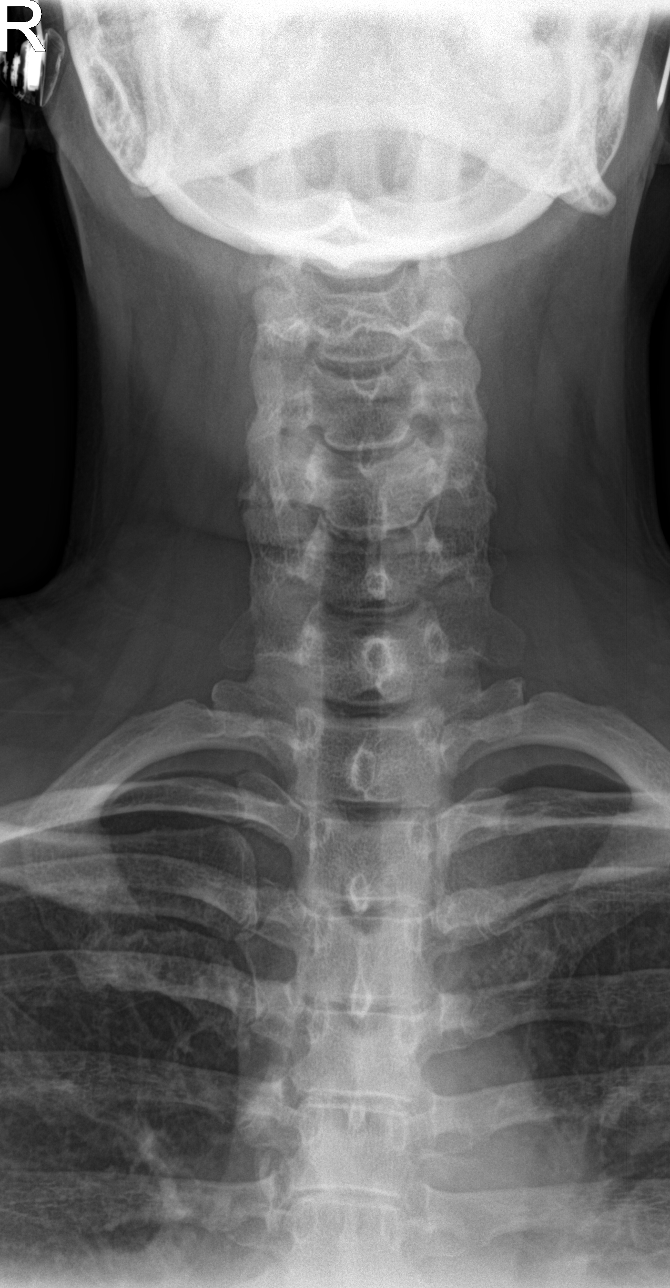

[c-spine open mouth]
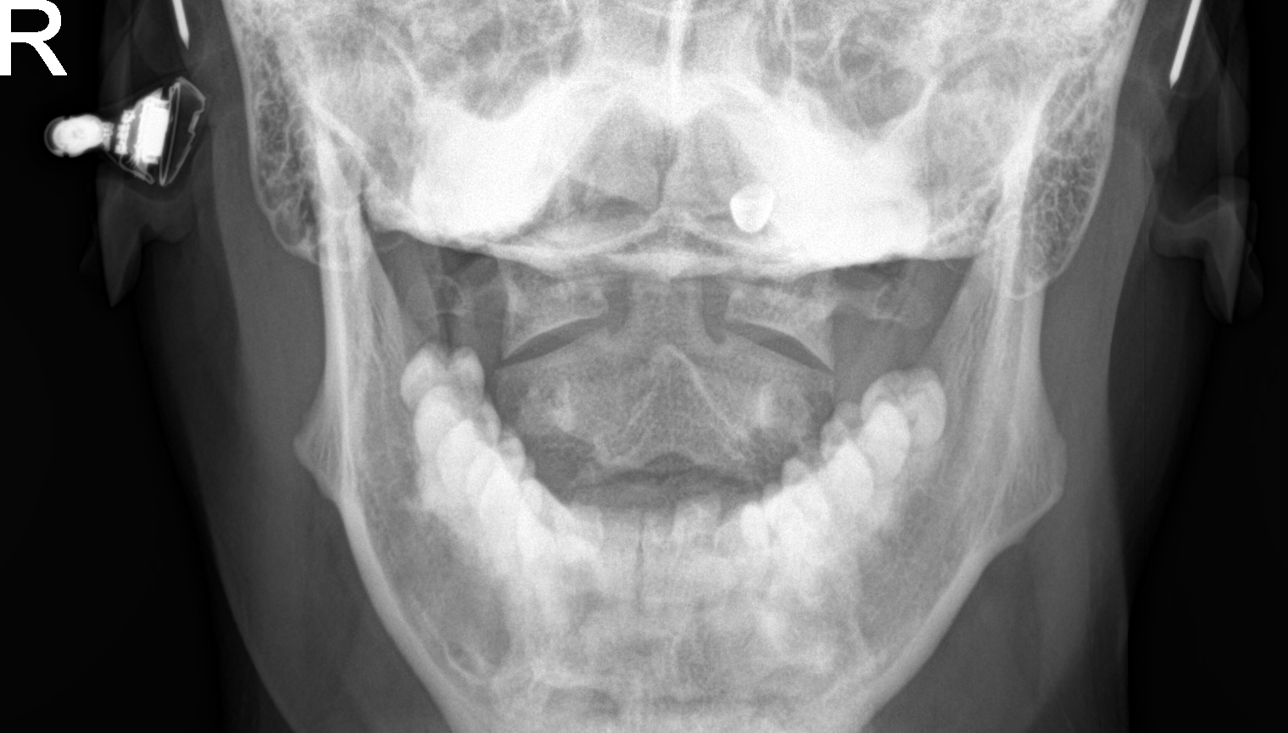

[5 of 5 positions shown; findings below may reference images not displayed]

FINDINGS: C5-C6 disc degeneration with endplate osteophyte formation noted. No
acute bony or joint abnormality. No evidence of fracture
dislocation. Ligamentous ossification noted. Pulmonary apices are
clear.
IMPRESSION: C5-C6 disc degeneration with endplate osteophyte formation noted. No
acute abnormality identified.

## 2022-09-05 ENCOUNTER — Encounter: Payer: Self-pay | Admitting: Family

## 2022-09-05 ENCOUNTER — Ambulatory Visit (INDEPENDENT_AMBULATORY_CARE_PROVIDER_SITE_OTHER): Payer: 59 | Admitting: Family

## 2022-09-05 VITALS — BP 129/79 | HR 77 | Temp 98.1°F | Ht 66.0 in | Wt 217.8 lb

## 2022-09-05 DIAGNOSIS — E785 Hyperlipidemia, unspecified: Secondary | ICD-10-CM

## 2022-09-05 DIAGNOSIS — E559 Vitamin D deficiency, unspecified: Secondary | ICD-10-CM

## 2022-09-05 DIAGNOSIS — Z0000 Encounter for general adult medical examination without abnormal findings: Secondary | ICD-10-CM

## 2022-09-05 DIAGNOSIS — I10 Essential (primary) hypertension: Secondary | ICD-10-CM | POA: Diagnosis not present

## 2022-09-05 DIAGNOSIS — Z0001 Encounter for general adult medical examination with abnormal findings: Secondary | ICD-10-CM | POA: Diagnosis not present

## 2022-09-05 DIAGNOSIS — Z23 Encounter for immunization: Secondary | ICD-10-CM | POA: Diagnosis not present

## 2022-09-05 DIAGNOSIS — G44209 Tension-type headache, unspecified, not intractable: Secondary | ICD-10-CM | POA: Diagnosis not present

## 2022-09-05 DIAGNOSIS — K219 Gastro-esophageal reflux disease without esophagitis: Secondary | ICD-10-CM

## 2022-09-05 MED ORDER — SUMATRIPTAN SUCCINATE 25 MG PO TABS: ORAL_TABLET | ORAL | 0 refills | Status: AC

## 2022-09-05 MED ORDER — HYDROCHLOROTHIAZIDE 12.5 MG PO TABS: 12.5000 mg | ORAL_TABLET | Freq: Every day | ORAL | 0 refills | Status: DC | PRN

## 2022-09-05 MED ORDER — AMLODIPINE BESYLATE 5 MG PO TABS: 5.0000 mg | ORAL_TABLET | Freq: Every day | ORAL | 3 refills | Status: DC

## 2022-09-05 MED ORDER — VITAMIN D (ERGOCALCIFEROL) 1.25 MG (50000 UNIT) PO CAPS: 50000.0000 [IU] | ORAL_CAPSULE | ORAL | 3 refills | Status: DC

## 2022-09-05 MED ORDER — OMEPRAZOLE 20 MG PO CPDR: 20.0000 mg | DELAYED_RELEASE_CAPSULE | Freq: Two times a day (BID) | ORAL | 2 refills | Status: DC

## 2022-09-05 NOTE — Progress Notes (Signed)
Subjective:    Patient ID: Sabrina [name], female    DOB: 17-Jun-1971, 51 y.o.   MRN: [id number]  Chief Complaint  Patient presents with   Annual Exam   Pt presents to the office today for CPE without pap. She had her pap 08/16/21.  She is morbid obese with a BMI of 35 with HTN and Hyperlipidemia.  Hypertension This is a chronic problem. The current episode started more than 1 year ago. The problem has been waxing and waning since onset. The problem is uncontrolled. Associated symptoms include peripheral edema. Pertinent negatives include no malaise/fatigue or shortness of breath. The current treatment provides moderate improvement.  Gastroesophageal Reflux She complains of belching and heartburn. This is a chronic problem. The current episode started more than 1 year ago. The problem occurs occasionally. Risk factors include obesity. She has tried a PPI for the symptoms. The treatment provided moderate relief.  Hyperlipidemia This is a chronic problem. The current episode started more than 1 year ago. Exacerbating diseases include obesity. Pertinent negatives include no shortness of breath. Current antihyperlipidemic treatment includes statins. The current treatment provides moderate improvement of lipids. Risk factors for coronary artery disease include dyslipidemia and a sedentary lifestyle.      Review of Systems  Constitutional:  Negative for malaise/fatigue.  Respiratory:  Negative for shortness of breath.   Gastrointestinal:  Positive for heartburn.  All other systems reviewed and are negative.      Objective:   Physical Exam Vitals reviewed.  Constitutional:      General: She is not in acute distress.    Appearance: She is well-developed. She is obese.  HENT:     Head: Normocephalic and atraumatic.     Right Ear: Tympanic membrane normal.     Left Ear: Tympanic membrane normal.  Eyes:     Pupils: Pupils are equal, round, and reactive to light.  Neck:     Thyroid:  No thyromegaly.  Cardiovascular:     Rate and Rhythm: Normal rate and regular rhythm.     Heart sounds: Normal heart sounds. No murmur heard. Pulmonary:     Effort: Pulmonary effort is normal. No respiratory distress.     Breath sounds: Normal breath sounds. No wheezing.  Abdominal:     General: Bowel sounds are normal. There is no distension.     Palpations: Abdomen is soft.     Tenderness: There is no abdominal tenderness.  Musculoskeletal:        General: No tenderness. Normal range of motion.     Cervical back: Normal range of motion and neck supple.  Skin:    General: Skin is warm and dry.  Neurological:     Mental Status: She is alert and oriented to person, place, and time.     Cranial Nerves: No cranial nerve deficit.     Deep Tendon Reflexes: Reflexes are normal and symmetric.  Psychiatric:        Behavior: Behavior normal.        Thought Content: Thought content normal.        Judgment: Judgment normal.       BP 129/79   Pulse 77   Temp 98.1 F (36.7 C) (Temporal)   Ht 5' 6" (1.676 m)   Wt 217 lb 12.8 oz (98.8 kg)   SpO2 100%   BMI 35.15 kg/m      Assessment & Plan:  Sabrina [name] comes in today with chief complaint of Annual Exam   Diagnosis and  orders addressed:  1. Essential hypertension, benign - amLODipine (NORVASC) 5 MG tablet; Take 1 tablet (5 mg total) by mouth daily.  Dispense: 90 tablet; Refill: 3 - hydrochlorothiazide (HYDRODIURIL) 12.5 MG tablet; Take 1 tablet (12.5 mg total) by mouth daily as needed (fluid).  Dispense: 90 tablet; Refill: 0 - CMP14+EGFR - CBC with Differential/Platelet  2. Tension headache - SUMAtriptan (IMITREX) 25 MG tablet; TAKE 1 TABLET (25 MG) TOTAL BY MOUTH ONCE FOR 1 DOSE. MAY REPEAT IN 2 HOURS IF HEADACHE PERSISTS OR RECURS  Dispense: 10 tablet; Refill: 0 - CMP14+EGFR - CBC with Differential/Platelet  3. Vitamin D deficiency - Vitamin D, Ergocalciferol, (DRISDOL) 1.25 MG (50000 UNIT) CAPS capsule; Take 1  capsule (50,000 Units total) by mouth every 7 (seven) days.  Dispense: 12 capsule; Refill: 3 - CMP14+EGFR - CBC with Differential/Platelet - VITAMIN D 25 Hydroxy (Vit-D Deficiency, Fractures)  4. Annual physical exam  - CMP14+EGFR - CBC with Differential/Platelet - Lipid panel - TSH  5. Morbid obesity ([place])  - CMP14+EGFR - CBC with Differential/Platelet  6. Hyperlipidemia, unspecified hyperlipidemia type - CMP14+EGFR - CBC with Differential/Platelet - Lipid panel  7. Gastroesophageal reflux disease, unspecified whether esophagitis present  - CMP14+EGFR - CBC with Differential/Platelet   Labs pending Health Maintenance reviewed Diet and exercise encouraged  Follow up plan: 1 year    Evelina Dun, FNP

## 2022-09-05 NOTE — Patient Instructions (Signed)

## 2022-09-06 ENCOUNTER — Other Ambulatory Visit: Payer: Self-pay | Admitting: Family

## 2022-09-06 LAB — CMP14+EGFR
ALT: 52 IU/L — ABNORMAL HIGH (ref 0–32)
AST: 24 IU/L (ref 0–40)
Albumin/Globulin Ratio: 1.8 (ref 1.2–2.2)
Albumin: 4.2 g/dL (ref 3.9–4.9)
Alkaline Phosphatase: 74 IU/L (ref 44–121)
BUN/Creatinine Ratio: 16 (ref 9–23)
BUN: 15 mg/dL (ref 6–24)
Bilirubin Total: 0.2 mg/dL (ref 0.0–1.2)
CO2: 22 mmol/L (ref 20–29)
Calcium: 10.9 mg/dL — ABNORMAL HIGH (ref 8.7–10.2)
Chloride: 102 mmol/L (ref 96–106)
Creatinine, Ser: 0.96 mg/dL (ref 0.57–1.00)
Globulin, Total: 2.3 g/dL (ref 1.5–4.5)
Glucose: 87 mg/dL (ref 70–99)
Potassium: 3.8 mmol/L (ref 3.5–5.2)
Sodium: 138 mmol/L (ref 134–144)
Total Protein: 6.5 g/dL (ref 6.0–8.5)
eGFR: 73 mL/min/{1.73_m2} (ref >59)

## 2022-09-06 LAB — CBC WITH DIFFERENTIAL/PLATELET
Basophils Absolute: 0 10*3/uL (ref 0.0–0.2)
Basos: 1 %
EOS (ABSOLUTE): 0 10*3/uL (ref 0.0–0.4)
Eos: 1 %
Hematocrit: 37.1 % (ref 34.0–46.6)
Hemoglobin: 12.8 g/dL (ref 11.1–15.9)
Immature Grans (Abs): 0 10*3/uL (ref 0.0–0.1)
Immature Granulocytes: 0 %
Lymphocytes Absolute: 2 10*3/uL (ref 0.7–3.1)
Lymphs: 36 %
MCH: 30.9 pg (ref 26.6–33.0)
MCHC: 34.5 g/dL (ref 31.5–35.7)
MCV: 90 fL (ref 79–97)
Monocytes Absolute: 0.3 10*3/uL (ref 0.1–0.9)
Monocytes: 6 %
Neutrophils Absolute: 3 10*3/uL (ref 1.4–7.0)
Neutrophils: 56 %
Platelets: 307 10*3/uL (ref 150–450)
RBC: 4.14 x10E6/uL (ref 3.77–5.28)
RDW: 11.2 % — ABNORMAL LOW (ref 11.7–15.4)
WBC: 5.4 10*3/uL (ref 3.4–10.8)

## 2022-09-06 LAB — LIPID PANEL
Chol/HDL Ratio: 3.6 ratio (ref 0.0–4.4)
Cholesterol, Total: 186 mg/dL (ref 100–199)
HDL: 51 mg/dL (ref >39)
LDL Chol Calc (NIH): 112 mg/dL — ABNORMAL HIGH (ref 0–99)
Triglycerides: 127 mg/dL (ref 0–149)
VLDL Cholesterol Cal: 23 mg/dL (ref 5–40)

## 2022-09-06 LAB — VITAMIN D 25 HYDROXY (VIT D DEFICIENCY, FRACTURES): Vit D, 25-Hydroxy: 29.9 ng/mL — ABNORMAL LOW (ref 30.0–100.0)

## 2022-09-06 LAB — TSH: TSH: 1.28 u[IU]/mL (ref 0.450–4.500)

## 2022-09-08 ENCOUNTER — Ambulatory Visit: Payer: 59 | Admitting: Family Medicine

## 2022-09-08 ENCOUNTER — Telehealth: Payer: Self-pay | Admitting: Family

## 2022-09-08 NOTE — Telephone Encounter (Signed)
Spoke with patient, appointment scheduled. 

## 2022-11-15 ENCOUNTER — Encounter: Payer: Self-pay | Admitting: Adult Health

## 2022-11-15 ENCOUNTER — Ambulatory Visit (INDEPENDENT_AMBULATORY_CARE_PROVIDER_SITE_OTHER): Payer: 59 | Admitting: Adult Health

## 2022-11-15 VITALS — BP 139/93 | HR 82 | Ht 66.0 in | Wt 226.5 lb

## 2022-11-15 DIAGNOSIS — N951 Menopausal and female climacteric states: Secondary | ICD-10-CM

## 2022-11-15 DIAGNOSIS — D219 Benign neoplasm of connective and other soft tissue, unspecified: Secondary | ICD-10-CM | POA: Diagnosis not present

## 2022-11-15 DIAGNOSIS — N946 Dysmenorrhea, unspecified: Secondary | ICD-10-CM

## 2022-11-15 DIAGNOSIS — R61 Generalized hyperhidrosis: Secondary | ICD-10-CM

## 2022-11-15 DIAGNOSIS — Z1211 Encounter for screening for malignant neoplasm of colon: Secondary | ICD-10-CM | POA: Diagnosis not present

## 2022-11-15 DIAGNOSIS — Z01419 Encounter for gynecological examination (general) (routine) without abnormal findings: Secondary | ICD-10-CM

## 2022-11-15 DIAGNOSIS — G479 Sleep disorder, unspecified: Secondary | ICD-10-CM | POA: Insufficient documentation

## 2022-11-15 DIAGNOSIS — N852 Hypertrophy of uterus: Secondary | ICD-10-CM

## 2022-11-15 DIAGNOSIS — N926 Irregular menstruation, unspecified: Secondary | ICD-10-CM

## 2022-11-15 LAB — HEMOCCULT GUIAC POC 1CARD (OFFICE): Fecal Occult Blood, POC: NEGATIVE

## 2022-11-15 MED ORDER — TRAZODONE HCL 50 MG PO TABS: 50.0000 mg | ORAL_TABLET | Freq: Every day | ORAL | 3 refills | Status: DC

## 2022-11-15 NOTE — Progress Notes (Signed)
Patient ID: Sabrina [name], female   DOB: 07/16/1971, 52 y.o.   MRN: [id number] History of Present Illness: Sabrina [name] is a 52 year old black female,married, [id number] in for a well woman gyn exam. She has had some irregular bleeding with last period, started 1/22 was light til 1/26 then spotted 1/27 to 2/5 then cramped and clots and pressure. Has had night sweats maybe twice and sleep is not great, wakes up and has trouble to go to sleep at times, takes advil PM.  Last pap was negative HPV,NILM 08/16/21.  PCP is Kayren Eaves NP.   Current Medications, Allergies, Past Medical History, Past Surgical History, Family History and Social History were reviewed in [employer] record.     Review of Systems: Patient denies any headaches, hearing loss, fatigue, blurred vision, shortness of breath, chest pain, abdominal pain, problems with bowel movements, urination, or intercourse. No joint pain or mood swings.  See HPI for positives.   Physical Exam:BP (!) 139/93 (BP Location: Right Arm, Patient Position: Sitting, Cuff Size: Normal)   Pulse 82   Ht 5' 6"$  (1.676 m)   Wt 226 lb 8 oz (102.7 kg)   LMP 10/24/2022   BMI 36.56 kg/m   General:  Well developed, well nourished, no acute distress Skin:  Warm and dry Neck:  Midline trachea, normal thyroid, good ROM, no lymphadenopathy Lungs; Clear to auscultation bilaterally Breast:  No dominant palpable mass, retraction, or nipple discharge Cardiovascular: Regular rate and rhythm Abdomen:  Soft, non tender, no hepatosplenomegaly Pelvic:  External genitalia is normal in appearance, no lesions.  The vagina is normal in appearance. Urethra has no lesions or masses. The cervix is bulbous.  Uterus is felt to be enlarged, about 14 week size, and fibroid felt.  No adnexal masses or tenderness noted.Bladder is non tender, no masses felt. Rectal: Good sphincter tone, no polyps, or hemorrhoids felt.  Hemoccult negative. Extremities/musculoskeletal:   No swelling or varicosities noted, no clubbing or cyanosis Psych:  No mood changes, alert and cooperative,seems happy AA is 0 Fall risk is low    11/15/2022    1:41 PM 09/05/2022    2:58 PM 08/16/2021    3:19 PM  Depression screen PHQ 2/9  Decreased Interest 1 0 2  Down, Depressed, Hopeless 0 2 0  PHQ - 2 Score 1 2 2  $ Altered sleeping 2 2 0  Tired, decreased energy 2 3 3  $ Change in appetite 1 1 2  $ Feeling bad or failure about yourself  0 0 0  Trouble concentrating 1 0 0  Moving slowly or fidgety/restless 0 0 0  Suicidal thoughts 0 0 0  PHQ-9 Score 7 8 7  $ Difficult doing work/chores  Somewhat difficult Somewhat difficult   Life is stressful     11/15/2022    1:41 PM 09/05/2022    2:59 PM 08/16/2021    3:21 PM  GAD 7 : Generalized Anxiety Score  Nervous, Anxious, on Edge 1 2 2  $ Control/stop worrying 1 0 0  Worry too much - different things 1 1 0  Trouble relaxing 1 1 2  $ Restless 0 0 0  Easily annoyed or irritable 0 1 2  Afraid - awful might happen 0 0 0  Total GAD 7 Score 4 5 6  $ Anxiety Difficulty   Somewhat difficult    Upstream - 11/15/22 1347       Pregnancy Intention Screening   Does the patient want to become pregnant in the next year? No  Does the patient's partner want to become pregnant in the next year? No    Would the patient like to discuss contraceptive options today? No      Contraception Wrap Up   Current Method Female Sterilization    End Method Female Sterilization    Contraception Counseling Provided No            Examination chaperoned by Levy Pupa LPN    Impression and Plan: 1. Encounter for well woman exam with routine gynecological exam Pap and physical in 1 year Had negative mammogram 09/01/21 Colonoscopy per GI Labs with PCP  2. Encounter for screening fecal occult blood testing Hemoccult was negative   3. Enlarged uterus About 14 week size Will get pelvic US 11/23/22 at 3:30 pm to evaluate uterus  Will talk when results back   - US PELVIC COMPLETE WITH TRANSVAGINAL; Future  4. Fibroid - US PELVIC COMPLETE WITH TRANSVAGINAL; Future  5. Irregular bleeding - US PELVIC COMPLETE WITH TRANSVAGINAL; Future  6. Menstrual cramps - US PELVIC COMPLETE WITH TRANSVAGINAL; Future  7. Sleep disturbance Will try trazodone Meds ordered this encounter  Medications   traZODone (DESYREL) 50 MG tablet    Sig: Take 1 tablet (50 mg total) by mouth at bedtime.    Dispense:  30 tablet    Refill:  3    Order Specific Question:   Supervising Provider    Answer:   Elonda Husky, LUTHER H [[id number]]     8. Night sweats   9. Perimenopause Discussed perimenopause with her   10. Serum calcium elevated Calcium level was 10.9 in December at PCP, will recheck - Comprehensive metabolic panel

## 2022-11-16 LAB — COMPREHENSIVE METABOLIC PANEL
ALT: 51 IU/L — ABNORMAL HIGH (ref 0–32)
AST: 29 IU/L (ref 0–40)
Albumin/Globulin Ratio: 1.8 (ref 1.2–2.2)
Albumin: 4.2 g/dL (ref 3.9–4.9)
Alkaline Phosphatase: 90 IU/L (ref 44–121)
BUN/Creatinine Ratio: 10 (ref 9–23)
BUN: 10 mg/dL (ref 6–24)
Bilirubin Total: 0.2 mg/dL (ref 0.0–1.2)
CO2: 24 mmol/L (ref 20–29)
Calcium: 11.1 mg/dL — ABNORMAL HIGH (ref 8.7–10.2)
Chloride: 101 mmol/L (ref 96–106)
Creatinine, Ser: 1.02 mg/dL — ABNORMAL HIGH (ref 0.57–1.00)
Globulin, Total: 2.4 g/dL (ref 1.5–4.5)
Glucose: 92 mg/dL (ref 70–99)
Potassium: 4.2 mmol/L (ref 3.5–5.2)
Sodium: 138 mmol/L (ref 134–144)
Total Protein: 6.6 g/dL (ref 6.0–8.5)
eGFR: 67 mL/min/{1.73_m2} (ref >59)

## 2022-11-18 ENCOUNTER — Other Ambulatory Visit: Payer: Self-pay | Admitting: Adult Health

## 2022-11-23 ENCOUNTER — Ambulatory Visit (HOSPITAL_COMMUNITY)
Admission: RE | Admit: 2022-11-23 | Discharge: 2022-11-23 | Disposition: A | Discharge status: Home / Self Care | Payer: 59 | Source: Ambulatory Visit | Attending: Adult Health | Admitting: Adult Health

## 2022-11-23 DIAGNOSIS — N926 Irregular menstruation, unspecified: Secondary | ICD-10-CM | POA: Diagnosis present

## 2022-11-23 DIAGNOSIS — N946 Dysmenorrhea, unspecified: Secondary | ICD-10-CM

## 2022-11-23 DIAGNOSIS — D219 Benign neoplasm of connective and other soft tissue, unspecified: Secondary | ICD-10-CM | POA: Diagnosis present

## 2022-11-23 DIAGNOSIS — N852 Hypertrophy of uterus: Secondary | ICD-10-CM | POA: Insufficient documentation

## 2022-12-22 ENCOUNTER — Encounter: Payer: Self-pay | Admitting: "Endocrinology

## 2023-01-19 ENCOUNTER — Ambulatory Visit: Payer: 59 | Admitting: "Endocrinology

## 2023-03-29 ENCOUNTER — Other Ambulatory Visit: Payer: Self-pay | Admitting: Adult Health

## 2023-04-17 ENCOUNTER — Encounter: Payer: Self-pay | Admitting: "Endocrinology

## 2023-04-17 ENCOUNTER — Ambulatory Visit (INDEPENDENT_AMBULATORY_CARE_PROVIDER_SITE_OTHER): Payer: 59 | Admitting: "Endocrinology

## 2023-04-17 DIAGNOSIS — E559 Vitamin D deficiency, unspecified: Secondary | ICD-10-CM

## 2023-04-17 NOTE — Progress Notes (Signed)
Endocrinology Consult Note                                            04/17/2023, 4:55 PM   Subjective:    Patient ID: Sabrina [name], female    DOB: 09/24/1971, PCP Junie Spencer, FNP   Past Medical History:  Diagnosis Date   GERD (gastroesophageal reflux disease)    Hypertension    Past Surgical History:  Procedure Laterality Date   CESAREAN SECTION  06/30/2008   CHOLECYSTECTOMY N/A 12/25/2019   Procedure: LAPAROSCOPIC CHOLECYSTECTOMY WITH INTRAOPERATIVE CHOLANGIOGRAM;  Surgeon: Lucretia Roers, MD;  Location: [facility];  Service: General;  Laterality: N/A;   COLONOSCOPY N/A 12/04/2019    sigmoid diverticulosis.   ECTOPIC PREGNANCY SURGERY     ESOPHAGOGASTRODUODENOSCOPY N/A 12/04/2019   normal esophagus, stomach, and duodenum.    TUBAL LIGATION  06/30/2008   Social History   Socioeconomic History   Marital status: Married    Spouse name: Not on file   Number of children: Not on file   Years of education: Not on file   Highest education level: Not on file  Occupational History   Not on file  Tobacco Use   Smoking status: Never   Smokeless tobacco: Never  Vaping Use   Vaping status: Never Used  Substance and Sexual Activity   Alcohol use: No   Drug use: No   Sexual activity: Yes    Birth control/protection: Surgical    Comment: tubal  Other Topics Concern   Not on file  Social History Narrative   Not on file   Social Determinants of Health   Financial Resource Strain: Medium Risk (11/15/2022)   Overall Financial Resource Strain (CARDIA)    Difficulty of Paying Living Expenses: Somewhat hard  Food Insecurity: No Food Insecurity (11/15/2022)   Hunger Vital Sign    Worried About Running Out of Food in the Last Year: Never true    Ran Out of Food in the Last Year: Never true  Transportation Needs: No Transportation Needs (11/15/2022)   PRAPARE - Administrator, Civil Service (Medical): No    Lack of Transportation (Non-Medical): No   Physical Activity: Insufficiently Active (11/15/2022)   Exercise Vital Sign    Days of Exercise per Week: 1 day    Minutes of Exercise per Session: 20 min  Stress: Stress Concern Present (11/15/2022)   [employer] of Occupational Health - Occupational Stress Questionnaire    Feeling of Stress : Rather much  Social Connections: Socially Integrated (11/15/2022)   Social Connection and Isolation Panel [NHANES]    Frequency of Communication with Friends and Family: More than three times a week    Frequency of Social Gatherings with Friends and Family: Once a week    Attends Religious Services: More than 4 times per year    Active Member of [employer] or Organizations: Yes    Attends Engineer, structural: More than 4 times per year    Marital Status: Married   Family History  Problem Relation Age of Onset   Diabetes Mother    Hypertension Mother    Heart attack Father    Pulmonary embolism Sister    Hypertension Other    Diabetes Other    Breast cancer Neg Hx    Outpatient Encounter Medications as of 04/17/2023  Medication  Sig   amLODipine (NORVASC) 5 MG tablet Take 1 tablet (5 mg total) by mouth daily.   hydrochlorothiazide (HYDRODIURIL) 12.5 MG tablet Take 1 tablet (12.5 mg total) by mouth daily as needed (fluid).   omeprazole (PRILOSEC) 20 MG capsule Take 1 capsule (20 mg total) by mouth in the morning and at bedtime. (Patient taking differently: Take 20 mg by mouth 2 (two) times daily as needed.)   ondansetron (ZOFRAN-ODT) 4 MG disintegrating tablet Take 1 tablet (4 mg total) by mouth every 8 (eight) hours as needed for nausea or vomiting.   Probiotic Product (PROBIOTIC PO) Take by mouth.   SUMAtriptan (IMITREX) 25 MG tablet TAKE 1 TABLET (25 MG) TOTAL BY MOUTH ONCE FOR 1 DOSE. MAY REPEAT IN 2 HOURS IF HEADACHE PERSISTS OR RECURS   traZODone (DESYREL) 50 MG tablet TAKE 1 TABLET BY MOUTH AT BEDTIME   Vitamin D, Ergocalciferol, (DRISDOL) 1.25 MG (50000 UNIT) CAPS capsule Take  1 capsule (50,000 Units total) by mouth every 7 (seven) days.   [DISCONTINUED] doxycycline (PERIOSTAT) 20 MG tablet Take 20 mg by mouth 2 (two) times daily. (Patient not taking: Reported on 11/15/2022)   No facility-administered encounter medications on file as of 04/17/2023.   ALLERGIES: No Known Allergies  VACCINATION STATUS: Immunization History  Administered Date(s) Administered   Moderna Sars-Covid-2 Vaccination 04/10/2020   PFIZER(Purple Top)SARS-COV-2 Vaccination 03/03/2020   Td 10/04/2011   Tdap 09/05/2022    HPI Sabrina [name] is 52 y.o. female who presents today with a medical history as above. she is being seen in consultation for hypercalcemia requested by Junie Spencer, FNP.  History is obtained directly from the patient.  She denies any prior history of parathyroid/thyroid dysfunction.  She has no family history of such disorders.  She was found to have hypercalcemia at least on 2 separate occasions between December 2023 in February 2024.  She denies any history of nephrolithiasis, tachyarrhythmias, seizure disorders.  She did not have any bone density studies done yet. The highest calcium she was found to have was 11.1, no associated PTH. She has vitamin D deficiency on treatment currently.  She denies to ever take calcium supplements.  Review of Systems  Constitutional: + Mildly fluctuating body weight , no fatigue, no subjective hyperthermia, no subjective hypothermia Eyes: no blurry vision, no xerophthalmia ENT: no sore throat, no nodules palpated in throat, no dysphagia/odynophagia, no hoarseness Cardiovascular: no Chest Pain, no Shortness of Breath, no palpitations, no leg swelling Respiratory: no cough, no shortness of breath Gastrointestinal: no Nausea/Vomiting/Diarhhea Musculoskeletal: no muscle/joint aches Skin: no rashes Neurological: no tremors, no numbness, no tingling, no dizziness Psychiatric: no depression, no anxiety  Objective:       04/17/2023     2:11 PM 11/15/2022    1:56 PM 11/15/2022    1:40 PM  Vitals with BMI  Height 5\' 6"   5\' 6"   Weight 212 lbs 6 oz  226 lbs 8 oz  BMI 34.3  36.58  Systolic 124 139 045  Diastolic 68 93 93  Pulse 68 82 82    BP 124/68   Pulse 68   Ht 5\' 6"  (1.676 m)   Wt 212 lb 6.4 oz (96.3 kg)   BMI 34.28 kg/m   Wt Readings from Last 3 Encounters:  04/17/23 212 lb 6.4 oz (96.3 kg)  11/15/22 226 lb 8 oz (102.7 kg)  09/05/22 217 lb 12.8 oz (98.8 kg)    Physical Exam  Constitutional:  Body mass index is 34.28 kg/m.,  not in acute distress, normal state of mind Eyes: PERRLA, EOMI, no exophthalmos ENT: moist mucous membranes, no gross thyromegaly, no gross cervical lymphadenopathy Cardiovascular: normal precordial activity, Regular Rate and Rhythm, no Murmur/Rubs/Gallops Respiratory:  adequate breathing efforts, no gross chest deformity, Clear to auscultation bilaterally Gastrointestinal: abdomen soft, Non -tender, No distension, Bowel Sounds present, no gross organomegaly Musculoskeletal: no gross deformities, strength intact in all four extremities, no peripheral edema Skin: moist, warm, no rashes Neurological: no tremor with outstretched hands, Deep tendon reflexes normal in bilateral lower extremities.  CMP ( most recent) CMP     Component Value Date/Time   NA 138 11/15/2022 1439   K 4.2 11/15/2022 1439   CL 101 11/15/2022 1439   CO2 24 11/15/2022 1439   GLUCOSE 92 11/15/2022 1439   GLUCOSE 92 10/21/2021 0845   BUN 10 11/15/2022 1439   CREATININE 1.02 (H) 11/15/2022 1439   CREATININE 0.91 12/17/2014 1226   CALCIUM 11.1 (H) 11/15/2022 1439   PROT 6.6 11/15/2022 1439   ALBUMIN 4.2 11/15/2022 1439   AST 29 11/15/2022 1439   ALT 51 (H) 11/15/2022 1439   ALKPHOS 90 11/15/2022 1439   BILITOT 0.2 11/15/2022 1439   EGFR 67 11/15/2022 1439   GFRNONAA >60 10/21/2021 0845   GFRNONAA 76 01/25/2013 1614     Diabetic Labs (most recent): Lab Results  Component Value Date   HGBA1C 4.7  12/17/2014     Lipid Panel ( most recent) Lipid Panel     Component Value Date/Time   CHOL 186 09/05/2022 1440   TRIG 127 09/05/2022 1440   HDL 51 09/05/2022 1440   CHOLHDL 3.6 09/05/2022 1440   CHOLHDL 3.6 12/17/2014 1226   VLDL 25 12/17/2014 1226   LDLCALC 112 (H) 09/05/2022 1440   LABVLDL 23 09/05/2022 1440      Lab Results  Component Value Date   TSH 1.280 09/05/2022   TSH 2.820 08/16/2021   TSH 1.630 08/13/2020   TSH 2.310 05/30/2019   TSH 1.390 05/01/2018      Assessment & Plan:   1. Hypercalcemia 2. Vitamin D deficiency  - Madalaine Portier  is being seen at a kind request of Junie Spencer, FNP. - I have reviewed her available  records and clinically evaluated the patient. - Based on these reviews, she has hypercalcemia on at least 2 separate occasions,  however,  there is not sufficient information to proceed with definitive treatment plan. -Is not clear if this is PTH related or not.  She will be considered for full set of labs as follows: - PTH, intact and calcium - Magnesium - Phosphorus - Calcium, urine, 24 hour; Future - DG Bone Density - TSH - T4, free - Creatinine, urine, 24 hour  Patient will also need bone density study. -If she is found to have hypercalcemia related to PTH excess, she will be considered for surgical treatment.  -She is encouraged to continue on her vitamin D27 manage 50,000 units weekly until she finishes her current supplies, and maintain with vitamin D3 2000 units daily.  - I did not initiate any new prescriptions today. - she is advised to maintain close follow up with Junie Spencer, FNP for primary care needs.   -Thank you for involving me in the care of this pleasant patient.  Time spent with the patient: 45  minutes, of which >50% was spent in  counseling her about her hypercalcemia and the rest in obtaining information about her symptoms, reviewing her previous labs/studies (  including abstractions from other  facilities),  evaluations, and treatments,  and developing a plan to confirm diagnosis and long term treatment based on the latest standards of care/guidelines; and documenting her care.  Sabrina [name] participated in the discussions, expressed understanding, and voiced agreement with the above plans.  All questions were answered to her satisfaction. she is encouraged to contact clinic should she have any questions or concerns prior to her return visit.  Follow up plan: Return in about 5 weeks (around 05/22/2023) for 24 Hr Urine Free Cortisol & Cr, DXA Scan B4 NV, F/U with Pre-visit Labs.   Marquis Lunch, MD [facility] [address] [place], [place] [address] Phone: [phone]  Fax: [phone]     04/17/2023, 4:55 PM  This note was partially dictated with voice recognition software. Similar sounding words can be transcribed inadequately or may not  be corrected upon review.

## 2023-04-26 ENCOUNTER — Ambulatory Visit (HOSPITAL_COMMUNITY)
Admission: RE | Admit: 2023-04-26 | Discharge: 2023-04-26 | Disposition: A | Discharge status: Home / Self Care | Payer: 59 | Source: Ambulatory Visit | Attending: "Endocrinology | Admitting: "Endocrinology

## 2023-04-27 LAB — PHOSPHORUS: Phosphorus: 2.2 mg/dL — ABNORMAL LOW (ref 3.0–4.3)

## 2023-04-27 LAB — PTH, INTACT AND CALCIUM: PTH: 100 pg/mL — ABNORMAL HIGH (ref 15–65)

## 2023-04-27 LAB — MAGNESIUM: Magnesium: 2.1 mg/dL (ref 1.6–2.3)

## 2023-04-27 LAB — TSH: TSH: 2.84 u[IU]/mL (ref 0.450–4.500)

## 2023-05-08 ENCOUNTER — Encounter (HOSPITAL_COMMUNITY): Payer: Self-pay | Admitting: *Deleted

## 2023-05-08 ENCOUNTER — Inpatient Hospital Stay (HOSPITAL_COMMUNITY)
Admission: EM | Admit: 2023-05-08 | Discharge: 2023-05-11 | DRG: 641 | Disposition: A | Discharge status: Home / Self Care | Payer: 59 | Attending: Internal Medicine | Admitting: Internal Medicine

## 2023-05-08 ENCOUNTER — Telehealth: Payer: Self-pay

## 2023-05-08 ENCOUNTER — Other Ambulatory Visit: Payer: Self-pay

## 2023-05-08 DIAGNOSIS — Z79899 Other long term (current) drug therapy: Secondary | ICD-10-CM

## 2023-05-08 DIAGNOSIS — Z9049 Acquired absence of other specified parts of digestive tract: Secondary | ICD-10-CM

## 2023-05-08 DIAGNOSIS — Z833 Family history of diabetes mellitus: Secondary | ICD-10-CM

## 2023-05-08 DIAGNOSIS — R531 Weakness: Secondary | ICD-10-CM | POA: Diagnosis present

## 2023-05-08 DIAGNOSIS — R609 Edema, unspecified: Secondary | ICD-10-CM | POA: Diagnosis present

## 2023-05-08 DIAGNOSIS — E876 Hypokalemia: Secondary | ICD-10-CM | POA: Diagnosis present

## 2023-05-08 DIAGNOSIS — R112 Nausea with vomiting, unspecified: Secondary | ICD-10-CM | POA: Diagnosis present

## 2023-05-08 DIAGNOSIS — R079 Chest pain, unspecified: Secondary | ICD-10-CM | POA: Diagnosis present

## 2023-05-08 DIAGNOSIS — K219 Gastro-esophageal reflux disease without esophagitis: Secondary | ICD-10-CM | POA: Diagnosis present

## 2023-05-08 DIAGNOSIS — D351 Benign neoplasm of parathyroid gland: Secondary | ICD-10-CM | POA: Diagnosis present

## 2023-05-08 DIAGNOSIS — E210 Primary hyperparathyroidism: Secondary | ICD-10-CM | POA: Insufficient documentation

## 2023-05-08 DIAGNOSIS — I10 Essential (primary) hypertension: Secondary | ICD-10-CM | POA: Diagnosis present

## 2023-05-08 DIAGNOSIS — R5383 Other fatigue: Secondary | ICD-10-CM | POA: Diagnosis present

## 2023-05-08 DIAGNOSIS — Z8249 Family history of ischemic heart disease and other diseases of the circulatory system: Secondary | ICD-10-CM

## 2023-05-08 DIAGNOSIS — E213 Hyperparathyroidism, unspecified: Secondary | ICD-10-CM | POA: Insufficient documentation

## 2023-05-08 LAB — CBC WITH DIFFERENTIAL/PLATELET
Abs Immature Granulocytes: 0.01 10*3/uL (ref 0.00–0.07)
Basophils Absolute: 0 10*3/uL (ref 0.0–0.1)
Basophils Relative: 1 %
Eosinophils Absolute: 0 10*3/uL (ref 0.0–0.5)
Eosinophils Relative: 1 %
HCT: 36.5 % (ref 36.0–46.0)
Hemoglobin: 12 g/dL (ref 12.0–15.0)
Immature Granulocytes: 0 %
Lymphocytes Relative: 35 %
Lymphs Abs: 2 10*3/uL (ref 0.7–4.0)
MCH: 31.2 pg (ref 26.0–34.0)
MCHC: 32.9 g/dL (ref 30.0–36.0)
MCV: 94.8 fL (ref 80.0–100.0)
Monocytes Absolute: 0.4 10*3/uL (ref 0.1–1.0)
Monocytes Relative: 7 %
Neutro Abs: 3.2 10*3/uL (ref 1.7–7.7)
Neutrophils Relative %: 56 %
Platelets: 251 10*3/uL (ref 150–400)
RBC: 3.85 MIL/uL — ABNORMAL LOW (ref 3.87–5.11)
RDW: 11.7 % (ref 11.5–15.5)
WBC: 5.7 10*3/uL (ref 4.0–10.5)
nRBC: 0 % (ref 0.0–0.2)

## 2023-05-08 LAB — BASIC METABOLIC PANEL
Anion gap: 4 — ABNORMAL LOW (ref 5–15)
BUN: 11 mg/dL (ref 6–20)
CO2: 25 mmol/L (ref 22–32)
Calcium: 11.4 mg/dL — ABNORMAL HIGH (ref 8.9–10.3)
Chloride: 104 mmol/L (ref 98–111)
Creatinine, Ser: 0.98 mg/dL (ref 0.44–1.00)
GFR, Estimated: >60 mL/min (ref >60)
Glucose, Bld: 100 mg/dL — ABNORMAL HIGH (ref 70–99)
Potassium: 3.5 mmol/L (ref 3.5–5.1)
Sodium: 133 mmol/L — ABNORMAL LOW (ref 135–145)

## 2023-05-08 LAB — MAGNESIUM: Magnesium: 2.1 mg/dL (ref 1.7–2.4)

## 2023-05-08 LAB — URINALYSIS, ROUTINE W REFLEX MICROSCOPIC
Bilirubin Urine: NEGATIVE
Glucose, UA: NEGATIVE mg/dL
Ketones, ur: NEGATIVE mg/dL
Leukocytes,Ua: NEGATIVE
Nitrite: NEGATIVE
Protein, ur: NEGATIVE mg/dL
Specific Gravity, Urine: 1.019 (ref 1.005–1.030)
pH: 6 (ref 5.0–8.0)

## 2023-05-08 LAB — HEPATIC FUNCTION PANEL
ALT: 25 U/L (ref 0–44)
AST: 18 U/L (ref 15–41)
Albumin: 3.9 g/dL (ref 3.5–5.0)
Alkaline Phosphatase: 60 U/L (ref 38–126)
Bilirubin, Direct: <0.1 mg/dL (ref 0.0–0.2)
Total Bilirubin: 0.5 mg/dL (ref 0.3–1.2)
Total Protein: 6.8 g/dL (ref 6.5–8.1)

## 2023-05-08 LAB — PHOSPHORUS: Phosphorus: 1.9 mg/dL — ABNORMAL LOW (ref 2.5–4.6)

## 2023-05-08 MED ORDER — ONDANSETRON HCL 4 MG/2ML IJ SOLN
4.0000 mg | Freq: Four times a day (QID) | INTRAMUSCULAR | Status: DC | PRN
  Administered 2023-05-09 (×2): 4 mg via INTRAVENOUS
  Filled 2023-05-08 (×2): qty 2

## 2023-05-08 MED ORDER — OXYCODONE HCL 5 MG PO TABS: 5.0000 mg | ORAL_TABLET | ORAL | Status: DC | PRN

## 2023-05-08 MED ORDER — MORPHINE SULFATE (PF) 2 MG/ML IV SOLN: 2.0000 mg | INTRAVENOUS | Status: DC | PRN

## 2023-05-08 MED ORDER — SODIUM CHLORIDE 0.9 % IV BOLUS
1000.0000 mL | Freq: Once | INTRAVENOUS | Status: AC
  Administered 2023-05-08: 1000 mL via INTRAVENOUS

## 2023-05-08 MED ORDER — FUROSEMIDE 10 MG/ML IJ SOLN
20.0000 mg | Freq: Once | INTRAMUSCULAR | Status: AC
  Administered 2023-05-09: 20 mg via INTRAVENOUS
  Filled 2023-05-08: qty 2

## 2023-05-08 MED ORDER — ONDANSETRON HCL 4 MG PO TABS
4.0000 mg | ORAL_TABLET | Freq: Four times a day (QID) | ORAL | Status: DC | PRN
  Administered 2023-05-10 (×2): 4 mg via ORAL
  Filled 2023-05-08 (×2): qty 1

## 2023-05-08 MED ORDER — ACETAMINOPHEN 650 MG RE SUPP: 650.0000 mg | Freq: Four times a day (QID) | RECTAL | Status: DC | PRN

## 2023-05-08 MED ORDER — AMLODIPINE BESYLATE 5 MG PO TABS
5.0000 mg | ORAL_TABLET | Freq: Every day | ORAL | Status: DC
  Administered 2023-05-09: 5 mg via ORAL
  Filled 2023-05-08 (×2): qty 1

## 2023-05-08 MED ORDER — TRAZODONE HCL 50 MG PO TABS
50.0000 mg | ORAL_TABLET | Freq: Every day | ORAL | Status: DC
  Administered 2023-05-09 – 2023-05-10 (×3): 50 mg via ORAL
  Filled 2023-05-08 (×3): qty 1

## 2023-05-08 MED ORDER — ACETAMINOPHEN 325 MG PO TABS
650.0000 mg | ORAL_TABLET | Freq: Four times a day (QID) | ORAL | Status: DC | PRN
  Administered 2023-05-10: 650 mg via ORAL
  Filled 2023-05-08: qty 2

## 2023-05-08 MED ORDER — PANTOPRAZOLE SODIUM 40 MG PO TBEC
40.0000 mg | DELAYED_RELEASE_TABLET | Freq: Every day | ORAL | Status: DC
  Administered 2023-05-09 – 2023-05-10 (×2): 40 mg via ORAL
  Filled 2023-05-08 (×2): qty 1

## 2023-05-08 NOTE — Telephone Encounter (Signed)
Pt called stating she was not feeling well at her last visit. States she continues to experience swelling in her legs & feet, nausea, aching in her joints. States she has her labs drawn and has an appointment on 8/28. Asked if she should move her appt up or if her labs explains any of her symptoms.

## 2023-05-08 NOTE — ED Notes (Signed)
Pt reports weeks of feeling leg muscle cramps, increased fatigue, increased bilateral foot swelling, and malaise. Denies cough/fevers. No cp/sob.

## 2023-05-08 NOTE — Telephone Encounter (Signed)
Discussed with pt, understanding voiced. 

## 2023-05-08 NOTE — ED Notes (Signed)
ED TO INPATIENT HANDOFF REPORT  ED Nurse Name and Phone #: [phone]  S Name/Age/Gender Sabrina [name] 52 y.o. female Room/Bed: APA06/APA06  Code Status   Code Status: Not on file  Home/SNF/Other Home Patient oriented to: self, place, time, and situation Is this baseline? Yes   Triage Complete: Triage complete  Chief Complaint Hypercalcemia [E83.52]  Triage Note Pt states she went to her doctor and had blood work drawn and it showed her to have elevated calcium levels in her blood and urine  Pt called her doctor today and told him she did not feel any better and he told her to come to ED to be evaluated  Pt c/o bilateral feet swelling with headache and frequent urination and extreme tiredness   Allergies No Known Allergies  Level of Care/Admitting Diagnosis ED Disposition     ED Disposition  Admit   Condition  --   Comment  Hospital Area: [facility] [[id number]]  Level of Care: Telemetry [5]  Covid Evaluation: Asymptomatic - no recent exposure (last 10 days) testing not required  Diagnosis: Hypercalcemia [275.42.ICD-9-CM]  Admitting Physician: Lilyan Gilford [[id number]]  Attending Physician: Lilyan Gilford [[id number]]          B Medical/Surgery History Past Medical History:  Diagnosis Date   GERD (gastroesophageal reflux disease)    Hypertension    Past Surgical History:  Procedure Laterality Date   CESAREAN SECTION  06/30/2008   CHOLECYSTECTOMY N/A 12/25/2019   Procedure: LAPAROSCOPIC CHOLECYSTECTOMY WITH INTRAOPERATIVE CHOLANGIOGRAM;  Surgeon: Lucretia Roers, MD;  Location: [facility];  Service: General;  Laterality: N/A;   COLONOSCOPY N/A 12/04/2019    sigmoid diverticulosis.   ECTOPIC PREGNANCY SURGERY     ESOPHAGOGASTRODUODENOSCOPY N/A 12/04/2019   normal esophagus, stomach, and duodenum.    TUBAL LIGATION  06/30/2008     A IV Location/Drains/Wounds Patient Lines/Drains/Airways Status     Active Line/Drains/Airways     Name  Placement date Placement time Site Days   Peripheral IV 05/08/23 20 G Left Antecubital 05/08/23  2134  Antecubital  less than 1   Incision - 4 Ports Abdomen 1: Umbilicus 2: Medial;Superior 3: Right;Lateral;Upper 4: Right;Lateral;Lower 12/25/19  1148  -- 1230            Intake/Output Last 24 hours No intake or output data in the 24 hours ending 05/08/23 2211  Labs/Imaging Results for orders placed or performed during the hospital encounter of 05/08/23 (from the past 48 hour(s))  Urinalysis, Routine w reflex microscopic -Urine, Clean Catch     Status: Abnormal   Collection Time: 05/08/23  6:42 PM  Result Value Ref Range   Color, Urine YELLOW YELLOW   APPearance HAZY (A) CLEAR   Specific Gravity, Urine 1.019 1.005 - 1.030   pH 6.0 5.0 - 8.0   Glucose, UA NEGATIVE NEGATIVE mg/dL   Hgb urine dipstick MODERATE (A) NEGATIVE   Bilirubin Urine NEGATIVE NEGATIVE   Ketones, ur NEGATIVE NEGATIVE mg/dL   Protein, ur NEGATIVE NEGATIVE mg/dL   Nitrite NEGATIVE NEGATIVE   Leukocytes,Ua NEGATIVE NEGATIVE   RBC / HPF 0-5 0 - 5 RBC/hpf   WBC, UA 0-5 0 - 5 WBC/hpf   Bacteria, UA RARE (A) NONE SEEN   Squamous Epithelial / HPF 6-10 0 - 5 /HPF   Mucus PRESENT    Amorphous Crystal PRESENT    Ca Oxalate Crys, UA PRESENT     Comment: Performed at [facility], [address] [place], [place] [address]  CBC with  Differential     Status: Abnormal   Collection Time: 05/08/23  7:03 PM  Result Value Ref Range   WBC 5.7 4.0 - 10.5 K/uL   RBC 3.85 (L) 3.87 - 5.11 MIL/uL   Hemoglobin 12.0 12.0 - 15.0 g/dL   HCT 40.3 47.4 - 25.9 %   MCV 94.8 80.0 - 100.0 fL   MCH 31.2 26.0 - 34.0 pg   MCHC 32.9 30.0 - 36.0 g/dL   RDW 56.3 87.5 - 64.3 %   Platelets 251 150 - 400 K/uL   nRBC 0.0 0.0 - 0.2 %   Neutrophils Relative % 56 %   Neutro Abs 3.2 1.7 - 7.7 K/uL   Lymphocytes Relative 35 %   Lymphs Abs 2.0 0.7 - 4.0 K/uL   Monocytes Relative 7 %   Monocytes Absolute 0.4 0.1 - 1.0 K/uL   Eosinophils Relative  1 %   Eosinophils Absolute 0.0 0.0 - 0.5 K/uL   Basophils Relative 1 %   Basophils Absolute 0.0 0.0 - 0.1 K/uL   Immature Granulocytes 0 %   Abs Immature Granulocytes 0.01 0.00 - 0.07 K/uL    Comment: Performed at [facility], [address] [place], [place] [address]  Basic metabolic panel     Status: Abnormal   Collection Time: 05/08/23  7:03 PM  Result Value Ref Range   Sodium 133 (L) 135 - 145 mmol/L   Potassium 3.5 3.5 - 5.1 mmol/L   Chloride 104 98 - 111 mmol/L   CO2 25 22 - 32 mmol/L   Glucose, Bld 100 (H) 70 - 99 mg/dL    Comment: Glucose reference range applies only to samples taken after fasting for at least 8 hours.   BUN 11 6 - 20 mg/dL   Creatinine, Ser 8.84 0.44 - 1.00 mg/dL   Calcium 16.6 (H) 8.9 - 10.3 mg/dL   GFR, Estimated >06 >30 mL/min    Comment: (NOTE) Calculated using the CKD-EPI Creatinine Equation (2021)    Anion gap 4 (L) 5 - 15    Comment: Performed at [facility], [address] [place], [place] [address]  Hepatic function panel     Status: None   Collection Time: 05/08/23  7:03 PM  Result Value Ref Range   Total Protein 6.8 6.5 - 8.1 g/dL   Albumin 3.9 3.5 - 5.0 g/dL   AST 18 15 - 41 U/L   ALT 25 0 - 44 U/L   Alkaline Phosphatase 60 38 - 126 U/L   Total Bilirubin 0.5 0.3 - 1.2 mg/dL   Bilirubin, Direct <9.3 0.0 - 0.2 mg/dL   Indirect Bilirubin NOT CALCULATED 0.3 - 0.9 mg/dL    Comment: Performed at [facility], [address], [place], [place] [address]  Magnesium     Status: None   Collection Time: 05/08/23  7:03 PM  Result Value Ref Range   Magnesium 2.1 1.7 - 2.4 mg/dL    Comment: Performed at [facility], [address], [place], [place] [address]  Phosphorus     Status: Abnormal   Collection Time: 05/08/23  7:03 PM  Result Value Ref Range   Phosphorus 1.9 (L) 2.5 - 4.6 mg/dL    Comment: Performed at [facility], [address], [place], [place] [address]   No results found.  Pending Labs Unresulted Labs (From admission, onward)      Start     Ordered   05/09/23 0500  Parathyroid hormone, intact (no Ca)  Tomorrow morning,  R        05/08/23 2204   05/09/23 0500  VITAMIN D 25 Hydroxy (Vit-D Deficiency, Fractures)  Tomorrow morning,   R        05/08/23 2204   05/09/23 0000  Comprehensive metabolic panel  Once-Timed,   TIMED        05/08/23 2204   05/08/23 2200  Calcium, urine, random  Once,   R        05/08/23 2204   05/08/23 2125  Calcium, ionized  Once,   STAT        05/08/23 2124            Vitals/Pain Today's Vitals   05/08/23 2056 05/08/23 2100 05/08/23 2115 05/08/23 2130  BP:  (!) 143/83 (!) 148/76 (!) 151/82  Pulse: 72 73 71 74  Resp: 15 18 19 17   Temp:      TempSrc:      SpO2: 100% 100% 100% 100%  Weight:      Height:      PainSc:        Isolation Precautions No active isolations  Medications Medications  sodium chloride 0.9 % bolus 1,000 mL (1,000 mLs Intravenous New Bag/Given 05/08/23 2134)    Mobility walks     Focused Assessments Cardiac Assessment Handoff:  Cardiac Rhythm: Normal sinus rhythm No results found for: "CKTOTAL", "CKMB", "CKMBINDEX", "TROPONINI" No results found for: "DDIMER" Does the Patient currently have chest pain? No   , Pulmonary Assessment Handoff:  Lung sounds:   O2 Device: Room Air      R Recommendations: See Admitting Provider Note  Report given to:   Additional Notes: -

## 2023-05-08 NOTE — ED Provider Notes (Signed)
[place] EMERGENCY DEPARTMENT AT [facility] Provider Note   CSN: [id number] Arrival date & time: 05/08/23  1801     History  Chief Complaint  Patient presents with   Abnormal Lab    Rada Maaske is a 52 y.o. female.  HPI     Pt comes in with cc of elevated Ca. Pt has hx of HTN, GERD. States that her gyne noted increased Ca from workup in dec, and added repeat CA. It was still high and pt was given endocrine f/u. Pt states that she has abd cramps, nausea, leg swelling, generalized weakness, lower extremity pain, headaches as constellation of symptoms she has experienced over the past 3 months or so.   Home Medications Prior to Admission medications   Medication Sig Start Date End Date Taking? Authorizing Provider  amLODipine (NORVASC) 5 MG tablet Take 1 tablet (5 mg total) by mouth daily. 09/05/22   Jannifer Rodney A, FNP  hydrochlorothiazide (HYDRODIURIL) 12.5 MG tablet Take 1 tablet (12.5 mg total) by mouth daily as needed (fluid). 09/05/22   Junie Spencer, FNP  omeprazole (PRILOSEC) 20 MG capsule Take 1 capsule (20 mg total) by mouth in the morning and at bedtime. Patient taking differently: Take 20 mg by mouth 2 (two) times daily as needed. 09/05/22   Junie Spencer, FNP  ondansetron (ZOFRAN-ODT) 4 MG disintegrating tablet Take 1 tablet (4 mg total) by mouth every 8 (eight) hours as needed for nausea or vomiting. 12/25/19   Lucretia Roers, MD  Probiotic Product (PROBIOTIC PO) Take by mouth.    [provider]  SUMAtriptan (IMITREX) 25 MG tablet TAKE 1 TABLET (25 MG) TOTAL BY MOUTH ONCE FOR 1 DOSE. MAY REPEAT IN 2 HOURS IF HEADACHE PERSISTS OR RECURS 09/05/22   Jannifer Rodney A, FNP  traZODone (DESYREL) 50 MG tablet TAKE 1 TABLET BY MOUTH AT BEDTIME 03/29/23   Adline Potter, NP  Vitamin D, Ergocalciferol, (DRISDOL) 1.25 MG (50000 UNIT) CAPS capsule Take 1 capsule (50,000 Units total) by mouth every 7 (seven) days. 09/05/22   Junie Spencer, FNP       Allergies    Patient has no known allergies.    Review of Systems   Review of Systems  All other systems reviewed and are negative.   Physical Exam Updated Vital Signs BP (!) 151/82   Pulse 74   Temp 97.6 F (36.4 C) (Oral)   Resp 17   Ht 5\' 6"  (1.676 m)   Wt 96.3 kg   LMP 04/17/2023   SpO2 100%   BMI 34.27 kg/m  Physical Exam Vitals and nursing note reviewed.  Constitutional:      Appearance: She is well-developed.  HENT:     Head: Atraumatic.  Eyes:     Extraocular Movements: Extraocular movements intact.     Pupils: Pupils are equal, round, and reactive to light.  Cardiovascular:     Rate and Rhythm: Normal rate.  Pulmonary:     Effort: Pulmonary effort is normal.  Musculoskeletal:     Cervical back: Normal range of motion and neck supple.  Skin:    General: Skin is warm and dry.  Neurological:     Mental Status: She is alert and oriented to person, place, and time.     ED Results / Procedures / Treatments   Labs (all labs ordered are listed, but only abnormal results are displayed) Labs Reviewed  CBC WITH DIFFERENTIAL/PLATELET - Abnormal; Notable for the following components:  Result Value   RBC 3.85 (*)    All other components within normal limits  BASIC METABOLIC PANEL - Abnormal; Notable for the following components:   Sodium 133 (*)    Glucose, Bld 100 (*)    Calcium 11.4 (*)    Anion gap 4 (*)    All other components within normal limits  URINALYSIS, ROUTINE W REFLEX MICROSCOPIC - Abnormal; Notable for the following components:   APPearance HAZY (*)    Hgb urine dipstick MODERATE (*)    Bacteria, UA RARE (*)    All other components within normal limits  PHOSPHORUS - Abnormal; Notable for the following components:   Phosphorus 1.9 (*)    All other components within normal limits  HEPATIC FUNCTION PANEL  CALCIUM, IONIZED  MAGNESIUM    EKG EKG Interpretation Date/Time:  Monday May 08 2023 21:36:30 EDT Ventricular Rate:   70 PR Interval:  216 QRS Duration:  95 QT Interval:  360 QTC Calculation: 389 R Axis:   96  Text Interpretation: Sinus rhythm Prolonged PR interval Borderline right axis deviation Low voltage, precordial leads Borderline T abnormalities, anterior leads No acute changes No significant change since last tracing Confirmed by Derwood Kaplan [phone]) on 05/08/2023 9:56:08 PM  Radiology No results found.  Procedures Procedures    Medications Ordered in ED Medications  sodium chloride 0.9 % bolus 1,000 mL (1,000 mLs Intravenous New Bag/Given 05/08/23 2134)    ED Course/ Medical Decision Making/ A&P                                 Medical Decision Making Amount and/or Complexity of Data Reviewed Labs: ordered.   52 year old female comes in with chief complaint of abnormal lab.  Patient has history of hypertension.  It appears that she has had hypercalcemia at least since December.  She has been having constellation of symptoms that appear to be congruent with her diagnosis of hypercalcemia.  She called her endocrinologist, advised that she come to the ER for acute management of her symptoms.  Patient's differential diagnosis includes medication related side effects, thyroid disorder, parathyroid disorder, cancer.  I have reviewed patient's note including care everywhere and outpatient lab results.  Appears that patient's PTH was also elevated along with normal TSH.  Plan is to get EKG, repeat labs. Patient will receive 1 L of IV fluid now and we will admit her for optimization. Will defer additional care of hypercalcemia to admitting service.  Final Clinical Impression(s) / ED Diagnoses Final diagnoses:  Hypercalcemia    Rx / DC Orders ED Discharge Orders     None         Derwood Kaplan, MD 05/08/23 2157

## 2023-05-08 NOTE — ED Triage Notes (Signed)
Pt states she went to her doctor and had blood work drawn and it showed her to have elevated calcium levels in her blood and urine  Pt called her doctor today and told him she did not feel any better and he told her to come to ED to be evaluated  Pt c/o bilateral feet swelling with headache and frequent urination and extreme tiredness

## 2023-05-08 NOTE — ED Notes (Signed)
Pt ambulatory to room, family at bedside

## 2023-05-09 DIAGNOSIS — K219 Gastro-esophageal reflux disease without esophagitis: Secondary | ICD-10-CM

## 2023-05-09 DIAGNOSIS — E213 Hyperparathyroidism, unspecified: Secondary | ICD-10-CM | POA: Diagnosis not present

## 2023-05-09 DIAGNOSIS — D351 Benign neoplasm of parathyroid gland: Secondary | ICD-10-CM | POA: Diagnosis present

## 2023-05-09 DIAGNOSIS — E210 Primary hyperparathyroidism: Secondary | ICD-10-CM | POA: Insufficient documentation

## 2023-05-09 DIAGNOSIS — R5383 Other fatigue: Secondary | ICD-10-CM | POA: Diagnosis present

## 2023-05-09 DIAGNOSIS — I10 Essential (primary) hypertension: Secondary | ICD-10-CM | POA: Diagnosis present

## 2023-05-09 DIAGNOSIS — E876 Hypokalemia: Secondary | ICD-10-CM | POA: Diagnosis present

## 2023-05-09 DIAGNOSIS — Z833 Family history of diabetes mellitus: Secondary | ICD-10-CM | POA: Diagnosis not present

## 2023-05-09 DIAGNOSIS — Z79899 Other long term (current) drug therapy: Secondary | ICD-10-CM | POA: Diagnosis not present

## 2023-05-09 DIAGNOSIS — R609 Edema, unspecified: Secondary | ICD-10-CM | POA: Diagnosis present

## 2023-05-09 DIAGNOSIS — R079 Chest pain, unspecified: Secondary | ICD-10-CM | POA: Diagnosis present

## 2023-05-09 DIAGNOSIS — Z9049 Acquired absence of other specified parts of digestive tract: Secondary | ICD-10-CM | POA: Diagnosis not present

## 2023-05-09 DIAGNOSIS — R531 Weakness: Secondary | ICD-10-CM | POA: Diagnosis present

## 2023-05-09 DIAGNOSIS — Z8249 Family history of ischemic heart disease and other diseases of the circulatory system: Secondary | ICD-10-CM | POA: Diagnosis not present

## 2023-05-09 DIAGNOSIS — R112 Nausea with vomiting, unspecified: Secondary | ICD-10-CM | POA: Diagnosis present

## 2023-05-09 MED ORDER — POTASSIUM CHLORIDE CRYS ER 20 MEQ PO TBCR
40.0000 meq | EXTENDED_RELEASE_TABLET | Freq: Once | ORAL | Status: AC
  Administered 2023-05-09: 40 meq via ORAL
  Filled 2023-05-09: qty 2

## 2023-05-09 MED ORDER — SODIUM CHLORIDE 0.9 % IV SOLN: INTRAVENOUS | Status: DC

## 2023-05-09 MED ORDER — MORPHINE SULFATE (PF) 2 MG/ML IV SOLN: 1.0000 mg | INTRAVENOUS | Status: DC | PRN

## 2023-05-09 MED ORDER — CALCITONIN (SALMON) 200 UNIT/ML IJ SOLN
4.0000 [IU]/kg | Freq: Two times a day (BID) | INTRAMUSCULAR | Status: DC
  Administered 2023-05-09 (×2): 390 [IU] via SUBCUTANEOUS
  Filled 2023-05-09 (×4): qty 1.95

## 2023-05-09 MED ORDER — ALUM & MAG HYDROXIDE-SIMETH 200-200-20 MG/5ML PO SUSP
15.0000 mL | ORAL | Status: DC | PRN
  Administered 2023-05-09: 15 mL via ORAL
  Filled 2023-05-09: qty 30

## 2023-05-09 MED ORDER — ZOLEDRONIC ACID 4 MG/100ML IV SOLN
4.0000 mg | Freq: Once | INTRAVENOUS | Status: AC
  Administered 2023-05-09: 4 mg via INTRAVENOUS
  Filled 2023-05-09: qty 100

## 2023-05-09 MED ORDER — POTASSIUM & SODIUM PHOSPHATES 280-160-250 MG PO PACK
1.0000 | PACK | Freq: Once | ORAL | Status: AC
  Administered 2023-05-09: 1 via ORAL
  Filled 2023-05-09: qty 1

## 2023-05-09 NOTE — Plan of Care (Signed)

## 2023-05-09 NOTE — Assessment & Plan Note (Signed)
Continue PPI ?

## 2023-05-09 NOTE — Assessment & Plan Note (Signed)
-   Calcium 11.4 - This is actually down from 12.8 drawn at the endocrinologist - 1 L of fluid given in the ED - Patient does have some peripheral edema so a dose of Lasix also given - Calcium trending in the correct direction down to 10.8 - Trend again in the a.m. - Continue to monitor

## 2023-05-09 NOTE — Assessment & Plan Note (Signed)
-   Intact PTH 100 at the end of July - This was drawn by endocrinologist but patient has not yet followed up - Repeat today - Patient is symptomatic with hypercalcemia, hypophosphatemia, bones groans and moans, consult general surgery - Continue to monitor

## 2023-05-09 NOTE — Assessment & Plan Note (Signed)
Continue Norvasc

## 2023-05-09 NOTE — Progress Notes (Addendum)
ASSUMPTION OF CARE NOTE   05/09/2023 12:23 PM  Sabrina [name] was seen and examined.  The H&P by the admitting provider, orders, imaging was reviewed.  Please see new orders.  Will continue to follow. I have attempted to contact Dr. 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  He is making arrangements for patient to get an earlier appointment with his office and his secretary will call patient with the appointment and he will make arrangements to refer to an endocrine surgeon.  He is agreeable to giving IV zometa.  Pt should be able to discharge home 8/7 if labs are looking better.    Vitals:   05/08/23 2302 05/09/23 0545  BP: (!) 155/87 117/71  Pulse: 76 77  Resp: 18 18  Temp: 98.3 F (36.8 C) 98.4 F (36.9 C)  SpO2: 100% 100%    Results for orders placed or performed during the hospital encounter of 05/08/23  CBC with Differential  Result Value Ref Range   WBC 5.7 4.0 - 10.5 K/uL   RBC 3.85 (L) 3.87 - 5.11 MIL/uL   Hemoglobin 12.0 12.0 - 15.0 g/dL   HCT 46.2 70.3 - 50.0 %   MCV 94.8 80.0 - 100.0 fL   MCH 31.2 26.0 - 34.0 pg   MCHC 32.9 30.0 - 36.0 g/dL   RDW 93.8 18.2 - 99.3 %   Platelets 251 150 - 400 K/uL   nRBC 0.0 0.0 - 0.2 %   Neutrophils Relative % 56 %   Neutro Abs 3.2 1.7 - 7.7 K/uL   Lymphocytes Relative 35 %   Lymphs Abs 2.0 0.7 - 4.0 K/uL   Monocytes Relative 7 %   Monocytes Absolute 0.4 0.1 - 1.0 K/uL   Eosinophils Relative 1 %   Eosinophils Absolute 0.0 0.0 - 0.5 K/uL   Basophils Relative 1 %   Basophils Absolute 0.0 0.0 - 0.1 K/uL   Immature Granulocytes 0 %   Abs Immature Granulocytes 0.01 0.00 - 0.07 K/uL  Basic metabolic panel  Result Value Ref Range   Sodium 133 (L) 135 - 145 mmol/L   Potassium 3.5 3.5 - 5.1 mmol/L   Chloride 104 98 - 111 mmol/L   CO2 25 22 - 32 mmol/L   Glucose, Bld 100 (H) 70 -  99 mg/dL   BUN 11 6 - 20 mg/dL   Creatinine, Ser 7.16 0.44 - 1.00 mg/dL   Calcium 96.7 (H) 8.9 - 10.3 mg/dL   GFR, Estimated >89 >38 mL/min   Anion gap 4 (L) 5 - 15  Urinalysis, Routine w reflex microscopic -Urine, Clean Catch  Result Value Ref Range   Color, Urine YELLOW YELLOW   APPearance HAZY (A) CLEAR   Specific Gravity, Urine 1.019 1.005 - 1.030   pH 6.0 5.0 - 8.0   Glucose, UA NEGATIVE NEGATIVE mg/dL   Hgb urine dipstick MODERATE (A) NEGATIVE   Bilirubin Urine NEGATIVE NEGATIVE   Ketones, ur NEGATIVE NEGATIVE mg/dL   Protein, ur NEGATIVE NEGATIVE mg/dL   Nitrite NEGATIVE NEGATIVE   Leukocytes,Ua NEGATIVE NEGATIVE   RBC / HPF 0-5 0 - 5 RBC/hpf   WBC, UA 0-5 0 - 5 WBC/hpf   Bacteria, UA RARE (A) NONE SEEN   Squamous Epithelial / HPF 6-10 0 - 5 /HPF   Mucus PRESENT    Amorphous Crystal PRESENT    Ca  Oxalate Crys, UA PRESENT   Hepatic function panel  Result Value Ref Range   Total Protein 6.8 6.5 - 8.1 g/dL   Albumin 3.9 3.5 - 5.0 g/dL   AST 18 15 - 41 U/L   ALT 25 0 - 44 U/L   Alkaline Phosphatase 60 38 - 126 U/L   Total Bilirubin 0.5 0.3 - 1.2 mg/dL   Bilirubin, Direct <1.6 0.0 - 0.2 mg/dL   Indirect Bilirubin NOT CALCULATED 0.3 - 0.9 mg/dL  Magnesium  Result Value Ref Range   Magnesium 2.1 1.7 - 2.4 mg/dL  Phosphorus  Result Value Ref Range   Phosphorus 1.9 (L) 2.5 - 4.6 mg/dL  VITAMIN D 25 Hydroxy (Vit-D Deficiency, Fractures)  Result Value Ref Range   Vit D, 25-Hydroxy 49.37 30 - 100 ng/mL  Comprehensive metabolic panel  Result Value Ref Range   Sodium 135 135 - 145 mmol/L   Potassium 3.2 (L) 3.5 - 5.1 mmol/L   Chloride 106 98 - 111 mmol/L   CO2 25 22 - 32 mmol/L   Glucose, Bld 89 70 - 99 mg/dL   BUN 8 6 - 20 mg/dL   Creatinine, Ser 1.09 0.44 - 1.00 mg/dL   Calcium 60.4 (H) 8.9 - 10.3 mg/dL   Total Protein 6.8 6.5 - 8.1 g/dL   Albumin 3.8 3.5 - 5.0 g/dL   AST 17 15 - 41 U/L   ALT 25 0 - 44 U/L   Alkaline Phosphatase 55 38 - 126 U/L   Total Bilirubin  0.6 0.3 - 1.2 mg/dL   GFR, Estimated >54 >09 mL/min   Anion gap 4 (L) 5 - 15  HIV Antibody (routine testing w rflx)  Result Value Ref Range   HIV Screen 4th Generation wRfx Non Reactive Non Reactive  Comprehensive metabolic panel  Result Value Ref Range   Sodium 138 135 - 145 mmol/L   Potassium 3.4 (L) 3.5 - 5.1 mmol/L   Chloride 106 98 - 111 mmol/L   CO2 25 22 - 32 mmol/L   Glucose, Bld 95 70 - 99 mg/dL   BUN 10 6 - 20 mg/dL   Creatinine, Ser 8.11 0.44 - 1.00 mg/dL   Calcium 91.4 (H) 8.9 - 10.3 mg/dL   Total Protein 6.4 (L) 6.5 - 8.1 g/dL   Albumin 3.5 3.5 - 5.0 g/dL   AST 17 15 - 41 U/L   ALT 25 0 - 44 U/L   Alkaline Phosphatase 61 38 - 126 U/L   Total Bilirubin 0.6 0.3 - 1.2 mg/dL   GFR, Estimated >78 >29 mL/min   Anion gap 7 5 - 15  Magnesium  Result Value Ref Range   Magnesium 2.1 1.7 - 2.4 mg/dL  Phosphorus  Result Value Ref Range   Phosphorus 3.4 2.5 - 4.6 mg/dL  CBC with Differential/Platelet  Result Value Ref Range   WBC 5.3 4.0 - 10.5 K/uL   RBC 3.77 (L) 3.87 - 5.11 MIL/uL   Hemoglobin 11.8 (L) 12.0 - 15.0 g/dL   HCT 56.2 (L) 13.0 - 86.5 %   MCV 95.0 80.0 - 100.0 fL   MCH 31.3 26.0 - 34.0 pg   MCHC 33.0 30.0 - 36.0 g/dL   RDW 78.4 69.6 - 29.5 %   Platelets 251 150 - 400 K/uL   nRBC 0.0 0.0 - 0.2 %   Neutrophils Relative % 55 %   Neutro Abs 3.0 1.7 - 7.7 K/uL   Lymphocytes Relative 35 %   Lymphs Abs 1.9 0.7 -  4.0 K/uL   Monocytes Relative 8 %   Monocytes Absolute 0.4 0.1 - 1.0 K/uL   Eosinophils Relative 1 %   Eosinophils Absolute 0.1 0.0 - 0.5 K/uL   Basophils Relative 1 %   Basophils Absolute 0.0 0.0 - 0.1 K/uL   Immature Granulocytes 0 %   Abs Immature Granulocytes 0.01 0.00 - 0.07 K/uL     C. Laural Benes, MD [facility]   05/08/2023  6:40 PM How to contact the [facility] Attending or Consulting provider 7A - 7P or covering provider during after hours 7P -7A, for this patient?  Check the care team in [facility] and look for a) attending/consulting [facility]  provider listed and b) the [facility] team listed Log into [email or web address] and use [place]'s universal password to access. If you do not have the password, please contact the hospital operator. Locate the [facility] provider you are looking for under [facility] and page to a number that you can be directly reached. If you still have difficulty reaching the provider, please page the [facility] (Director on Call) for the Hospitalists listed on amion for assistance.

## 2023-05-09 NOTE — Assessment & Plan Note (Signed)
-   Likely related to hyperparathyroidism - Phosphorus 1.9 - 1 packet of potassium phosphate ordered - Trend in the a.m.

## 2023-05-09 NOTE — Progress Notes (Signed)
   05/09/23 1232  TOC Brief Assessment  Insurance and Status Reviewed  Patient has primary care physician Yes  Home environment has been reviewed with family  Prior level of function: independent  Prior/Current Home Services No current home services  Social Determinants of Health Reivew SDOH reviewed no interventions necessary  Readmission risk has been reviewed Yes  Transition of care needs no transition of care needs at this time   Transition of Care Department [facility]) has reviewed patient and no TOC needs have been identified at this time. We will continue to monitor patient advancement through interdisciplinary progression rounds. If new patient transition needs arise, please place a TOC consult.

## 2023-05-09 NOTE — H&P (Signed)
History and Physical    Patient: Sabrina [name] ZOX:[id number] DOB: 1971-05-23 DOA: 05/08/2023 DOS: the patient was seen and examined on 05/09/2023 PCP: Junie Spencer, FNP  Patient coming from: Home  Chief Complaint:  Chief Complaint  Patient presents with   Abnormal Lab   HPI: Sabrina [name] is a 52 y.o. female with medical history significant of GERD, hypertension, presents the ED with a chief complaint of abnormal labs.  Patient reports she has been having symptoms since June.  She has had fatigue and generalized weakness.  Her ankles and feet have been swelling and hurting like a soreness.  Her knees and hips have been hurting in the joints.  She has had chest pain several times in June, but only once in the last week.  She is sedentary when the chest pain happens.  She has had abdominal pain in her left upper and left lower quadrant.  Patient reports that she is also perimenopausal and has been having irregular cycles.  She went to her gynecologist who drew blood work noticed that she had an elevated calcium and sent her to endocrinology.  She saw endocrinology at the end of July and had more blood work done.  She reports over the last 3 weeks her symptoms have become worse.  She has not yet had a follow-up with her endocrinologist and that is scheduled for August 28.  Patient reports that she has had some nausea and vomiting.  She goes to family events she has to just sit there because she feels so drained.  Patient reports she is also had headaches.  She has no personal or family history of endocrine cancer or parathyroid problems.  Patient has no other complaints at this time.  Patient does not smoke, does not drink.  She is full code.  Her husband used to work in the [employer]. Review of Systems: As mentioned in the history of present illness. All other systems reviewed and are negative. Past Medical History:  Diagnosis Date   GERD (gastroesophageal reflux disease)    Hypertension     Past Surgical History:  Procedure Laterality Date   CESAREAN SECTION  06/30/2008   CHOLECYSTECTOMY N/A 12/25/2019   Procedure: LAPAROSCOPIC CHOLECYSTECTOMY WITH INTRAOPERATIVE CHOLANGIOGRAM;  Surgeon: Lucretia Roers, MD;  Location: [facility];  Service: General;  Laterality: N/A;   COLONOSCOPY N/A 12/04/2019    sigmoid diverticulosis.   ECTOPIC PREGNANCY SURGERY     ESOPHAGOGASTRODUODENOSCOPY N/A 12/04/2019   normal esophagus, stomach, and duodenum.    TUBAL LIGATION  06/30/2008   Social History:  reports that she has never smoked. She has never used smokeless tobacco. She reports that she does not drink alcohol and does not use drugs.  No Known Allergies  Family History  Problem Relation Age of Onset   Diabetes Mother    Hypertension Mother    Heart attack Father    Pulmonary embolism Sister    Hypertension Other    Diabetes Other    Breast cancer Neg Hx     Prior to Admission medications   Medication Sig Start Date End Date Taking? Authorizing Provider  amLODipine (NORVASC) 5 MG tablet Take 1 tablet (5 mg total) by mouth daily. 09/05/22   Jannifer Rodney A, FNP  hydrochlorothiazide (HYDRODIURIL) 12.5 MG tablet Take 1 tablet (12.5 mg total) by mouth daily as needed (fluid). 09/05/22   Junie Spencer, FNP  omeprazole (PRILOSEC) 20 MG capsule Take 1 capsule (20 mg total) by mouth in the morning  and at bedtime. Patient taking differently: Take 20 mg by mouth 2 (two) times daily as needed. 09/05/22   Junie Spencer, FNP  ondansetron (ZOFRAN-ODT) 4 MG disintegrating tablet Take 1 tablet (4 mg total) by mouth every 8 (eight) hours as needed for nausea or vomiting. 12/25/19   Lucretia Roers, MD  Probiotic Product (PROBIOTIC PO) Take by mouth.    [provider]  SUMAtriptan (IMITREX) 25 MG tablet TAKE 1 TABLET (25 MG) TOTAL BY MOUTH ONCE FOR 1 DOSE. MAY REPEAT IN 2 HOURS IF HEADACHE PERSISTS OR RECURS 09/05/22   Jannifer Rodney A, FNP  traZODone (DESYREL) 50 MG tablet TAKE  1 TABLET BY MOUTH AT BEDTIME 03/29/23   Adline Potter, NP  Vitamin D, Ergocalciferol, (DRISDOL) 1.25 MG (50000 UNIT) CAPS capsule Take 1 capsule (50,000 Units total) by mouth every 7 (seven) days. 09/05/22   Junie Spencer, FNP    Physical Exam: Vitals:   05/08/23 2215 05/08/23 2230 05/08/23 2247 05/08/23 2302  BP: (!) 155/84 (!) 143/79  (!) 155/87  Pulse: 69 75  76  Resp: 19 18  18   Temp:   97.6 F (36.4 C) 98.3 F (36.8 C)  TempSrc:   Oral Oral  SpO2: 100% 99%  100%  Weight:    97.5 kg  Height:    5\' 6"  (1.676 m)   1.  General: Patient lying supine in bed,  no acute distress   2. Psychiatric: Alert and oriented x 3, mood and behavior normal for situation, pleasant and cooperative with exam   3. Neurologic: Speech and language are normal, face is symmetric, moves all 4 extremities voluntarily, at baseline without acute deficits on limited exam   4. HEENMT:  Head is atraumatic, normocephalic, pupils reactive to light, neck is supple, trachea is midline, mucous membranes are moist   5. Respiratory : Lungs are clear to auscultation bilaterally without wheezing, rhonchi, rales, no cyanosis, no increase in work of breathing or accessory muscle use   6. Cardiovascular : Heart rate normal, rhythm is regular, no murmurs, rubs or gallops, 2+ peripheral edema, peripheral pulses palpated   7. Gastrointestinal:  Abdomen is soft, nondistended, nontender to palpation bowel sounds active, no masses or organomegaly palpated   8. Skin:  Skin is warm, dry and intact without rashes, acute lesions, or ulcers on limited exam   9.Musculoskeletal:  No acute deformities or trauma, no asymmetry in tone, 2+ peripheral edema, peripheral pulses palpated, no tenderness to palpation in the extremities  Data Reviewed: In the ED Patient is afebrile, heart rate is normal, respiratory rate is normal, patient is normotensive, satting at 100% No leukocytosis with white blood cell count of 5.7,  hemoglobin 12.0, platelets 251 Chemistry shows a elevated calcium of 11.4 UA is borderline but patient has no urinary symptoms Patient was given a liter of fluids and admission was requested for hypercalcemia  Assessment and Plan: * Hypercalcemia - Calcium 11.4 - This is actually down from 12.8 drawn at the endocrinologist - 1 L of fluid given in the ED - Patient does have some peripheral edema so a dose of Lasix also given - Calcium trending in the correct direction down to 10.8 - Trend again in the a.m. - Continue to monitor  Hypophosphatemia - Likely related to hyperparathyroidism - Phosphorus 1.9 - 1 packet of potassium phosphate ordered - Trend in the a.m.  Hyperparathyroidism (HCC) - Intact PTH 100 at the end of July - This was drawn by endocrinologist but  patient has not yet followed up - Repeat today - Patient is symptomatic with hypercalcemia, hypophosphatemia, bones groans and moans, consult general surgery - Continue to monitor  GERD (gastroesophageal reflux disease) - Continue PPI  Essential hypertension, benign - Continue Norvasc     Advance Care Planning:   Code Status: Full Code  Consults: General surgery  Family Communication: Husband at bedside  Severity of Illness: The appropriate patient status for this patient is OBSERVATION. Observation status is judged to be reasonable and necessary in order to provide the required intensity of service to ensure the patient's safety. The patient's presenting symptoms, physical exam findings, and initial radiographic and laboratory data in the context of their medical condition is felt to place them at decreased risk for further clinical deterioration. Furthermore, it is anticipated that the patient will be medically stable for discharge from the hospital within 2 midnights of admission.   Author: Lilyan Gilford, DO 05/09/2023 5:16 AM  For on call review [email or web address].

## 2023-05-09 NOTE — Plan of Care (Signed)

## 2023-05-10 ENCOUNTER — Inpatient Hospital Stay (HOSPITAL_COMMUNITY): Payer: 59

## 2023-05-10 DIAGNOSIS — R079 Chest pain, unspecified: Secondary | ICD-10-CM

## 2023-05-10 DIAGNOSIS — E213 Hyperparathyroidism, unspecified: Secondary | ICD-10-CM | POA: Diagnosis not present

## 2023-05-10 DIAGNOSIS — I10 Essential (primary) hypertension: Secondary | ICD-10-CM | POA: Diagnosis not present

## 2023-05-10 LAB — FOLATE: Folate: 4.9 ng/mL — ABNORMAL LOW (ref >5.9)

## 2023-05-10 LAB — ECHOCARDIOGRAM COMPLETE
AR max vel: 1.63 cm2
AV Area VTI: 1.44 cm2
AV Area mean vel: 1.52 cm2
AV Mean grad: 8 mmHg
AV Peak grad: 12.3 mmHg
Ao pk vel: 1.75 m/s
Area-P 1/2: 3.99 cm2
Height: 66 in
MV M vel: 1.43 m/s
MV Peak grad: 8.1 mmHg
S' Lateral: 3.2 cm
Weight: 3439.18 oz

## 2023-05-10 LAB — VITAMIN B12: Vitamin B-12: 219 pg/mL (ref 180–914)

## 2023-05-10 LAB — TROPONIN I (HIGH SENSITIVITY)
Troponin I (High Sensitivity): 2 ng/L (ref <18)
Troponin I (High Sensitivity): 3 ng/L (ref <18)

## 2023-05-10 LAB — LIPASE, BLOOD: Lipase: 26 U/L (ref 11–51)

## 2023-05-10 LAB — CK: Total CK: 50 U/L (ref 38–234)

## 2023-05-10 MED ORDER — ONDANSETRON HCL 4 MG/2ML IJ SOLN
4.0000 mg | Freq: Three times a day (TID) | INTRAMUSCULAR | Status: DC
  Administered 2023-05-10 – 2023-05-11 (×4): 4 mg via INTRAVENOUS
  Filled 2023-05-10 (×4): qty 2

## 2023-05-10 MED ORDER — PANTOPRAZOLE SODIUM 40 MG PO TBEC
40.0000 mg | DELAYED_RELEASE_TABLET | Freq: Two times a day (BID) | ORAL | Status: DC
  Administered 2023-05-10 – 2023-05-11 (×2): 40 mg via ORAL
  Filled 2023-05-10 (×2): qty 1

## 2023-05-10 MED ORDER — METOCLOPRAMIDE HCL 5 MG/ML IJ SOLN
10.0000 mg | Freq: Four times a day (QID) | INTRAMUSCULAR | Status: DC
  Administered 2023-05-10 – 2023-05-11 (×5): 10 mg via INTRAVENOUS
  Filled 2023-05-10 (×5): qty 2

## 2023-05-10 MED ORDER — METOCLOPRAMIDE HCL 5 MG/ML IJ SOLN
10.0000 mg | Freq: Once | INTRAMUSCULAR | Status: AC
  Administered 2023-05-10: 10 mg via INTRAVENOUS
  Filled 2023-05-10: qty 2

## 2023-05-10 NOTE — Hospital Course (Addendum)
52 year old female with a history of GERD, hypertension presenting with 2 to 45-month history of myalgias, arthralgias, generalized fatigue and malaise, and nausea.  She has had intermittent chest discomfort and also.  She states that her arthralgias and myalgias do not have any particular pattern.  They can be constant over a period of 24 hours as well as intermittent.  There is no frank synovitis.  She denies any rashes.  He has not had fevers or chills.  She has had some nausea with intermittent emesis.  She denies headache, abdominal pain, diarrhea, hematochezia, melena.  She has some dyspnea on exertion.  She has had intermittent abdominal pain.  Patient reports that she is also perimenopausal and has been having irregular cycles. She went to her gynecologist who drew blood work noticed that she had an elevated 11.8.  And sent her to endocrinology. She saw endocrinology at the end of July and had more blood work done. She reports over the last 3 weeks her symptoms have become worse.  In the ED, the patient was afebrile and hemodynamically stable.  Sodium 138, potassium 3.4, bicarbonate 25, serum creatinine 0.98.  Corrected calcium 11.8.  The patient was started on IV fluids and calcitonin.  She was given a dose of Zometa. UA was negative for pyuria.   Overall, her myalgias and arthralgias improved slightly.  Her energy level also improved.  However she continues to have some nausea and malaise. Her corrected calcium gradually improved. Dr. Laural Benes discussed her case with endocrine, Dr. Fransico Him.  As such, the patient's outpatient appointment was expedited to 05/12/23.

## 2023-05-10 NOTE — Progress Notes (Signed)
PROGRESS NOTE  Sabrina [name] UJW:[id number] DOB: December 06, 1970 DOA: 05/08/2023 PCP: Junie Spencer, FNP  Brief History:  52 year old female with a history of GERD, hypertension presenting with 2 to 42-month history of myalgias, arthralgias, generalized fatigue and malaise, and nausea.  She has had intermittent chest discomfort and also.  She states that her arthralgias and myalgias do not have any particular pattern.  They can be constant over a period of 24 hours as well as intermittent.  There is no frank synovitis.  She denies any rashes.  He has not had fevers or chills.  She has had some nausea with intermittent emesis.  She denies headache, abdominal pain, diarrhea, hematochezia, melena.  She has some dyspnea on exertion.  She has had intermittent abdominal pain.  Patient reports that she is also perimenopausal and has been having irregular cycles. She went to her gynecologist who drew blood work noticed that she had an elevated 11.8.  And sent her to endocrinology. She saw endocrinology at the end of July and had more blood work done. She reports over the last 3 weeks her symptoms have become worse.  In the ED, the patient was afebrile and hemodynamically stable.  Sodium 138, potassium 3.4, bicarbonate 25, serum creatinine 0.98.  Corrected calcium 11.8.  The patient was started on IV fluids and calcitonin.  She was given a dose of Zometa. UA was negative for pyuria.   Overall, her myalgias and arthralgias improved slightly.  Her energy level also improved.  However she continues to have some nausea and malaise. Her corrected calcium gradually improved. Dr. Laural Benes discussed her case with endocrine, Dr. Fransico Him.  As such, the patient's outpatient appointment was expedited to 05/12/23.   Assessment/Plan: Hypercalcemia -Corrected calcium peaked at 11.8 during the hospitalization -Continue IV fluids -Patient was given calcitonin and Zometa -Gradually improving -Secondary to primary  hyperparathyroidism -Patient has follow-up with endocrine, Dr. Fransico Him on 8/9 -04/26/23 iPTH--100  Primary hyperparathyroidism -Dr. Laural Benes spoke with endocrine, Dr. Fransico Him -Dr. 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  Chest pain -check troponin -Echo EF 60-65%, no WMA, G1DD, trivial MR  Myalgia/Arthalgia -CK 50 -no frank synovitis -normal LFTs -TSH 2.840  Intractable n/v -related to hypercalcemia -ATC zofran -short course reglan -increase pantoprazole to bid  Generalized weakness -multifactorial including deconditioning, primary HPT, low folate -folate 4.9>>replete -B12--219 -TSH 2.840  Hypophosphatemia - Likely related to hyperparathyroidism - Phosphorus 1.9 - 1 packet of potassium phosphate ordered - Trend in the a.m.  Essential HTN -holding amlodipine due to soft BPs         Family Communication:   spouse updated at bedside 8/7  Consultants:  none  Code Status:  FULL   DVT Prophylaxis:  [place] Heparin / [place] Lovenox   Procedures: As Listed in Progress Note Above  Antibiotics: None   Subjective: Patient had an episode of nausea and vomiting.  She remains nauseated.  She denies any chest pain, shortness breath, diarrhea, hematochezia, melena.  There is no dysuria or hematuria.  She denies any headache, fevers, chills  Objective: Vitals:   05/09/23 2011 05/10/23 0421 05/10/23 0748 05/10/23 1455  BP: (!) 126/92 116/72 (!) 97/52 123/79  Pulse: 86 91 93 88  Resp: 18 18 18 19   Temp: 97.8 F (36.6 C) 98.2 F (36.8 C) 98.6 F (37 C) 98.3 F (36.8 C)  TempSrc: Oral  Oral Oral  SpO2: 96% 96% 97% 91%  Weight:  Height:        Intake/Output Summary (Last 24 hours) at 05/10/2023 1724 Last data filed at 05/10/2023 1300 Gross per 24 hour  Intake 991.44 ml  Output --  Net 991.44 ml   Weight change:  Exam:  General:  Pt is alert, follows commands appropriately, not in acute distress HEENT: No icterus, No thrush, No  neck mass, [place]/AT Cardiovascular: RRR, S1/S2, no rubs, no gallops Respiratory: CTA bilaterally, no wheezing, no crackles, no rhonchi Abdomen: Soft/+BS, non tender, non distended, no guarding Extremities: No edema, No lymphangitis, No petechiae, No rashes, no synovitis   Data Reviewed: I have personally reviewed following labs and imaging studies Basic Metabolic Panel: Recent Labs  Lab 05/08/23 1903 05/09/23 0006 05/09/23 0454 05/10/23 0501  NA 133* 135 138 136  K 3.5 3.2* 3.4* 3.7  CL 104 106 106 107  CO2 25 25 25 22   GLUCOSE 100* 89 95 100*  BUN 11 8 10  5*  CREATININE 0.98 0.84 0.83 0.73  CALCIUM 11.4* 10.8* 11.8* 10.2  MG 2.1  --  2.1  --   PHOS 1.9*  --  3.4  --    Liver Function Tests: Recent Labs  Lab 05/08/23 1903 05/09/23 0006 05/09/23 0454 05/10/23 0501  AST 18 17 17   --   ALT 25 25 25   --   ALKPHOS 60 55 61  --   BILITOT 0.5 0.6 0.6  --   PROT 6.8 6.8 6.4*  --   ALBUMIN 3.9 3.8 3.5 3.8   Recent Labs  Lab 05/10/23 1604  LIPASE 26   No results for input(s): "AMMONIA" in the last 168 hours. Coagulation Profile: No results for input(s): "INR", "PROTIME" in the last 168 hours. CBC: Recent Labs  Lab 05/08/23 1903 05/09/23 0454  WBC 5.7 5.3  NEUTROABS 3.2 3.0  HGB 12.0 11.8*  HCT 36.5 35.8*  MCV 94.8 95.0  PLT 251 251   Cardiac Enzymes: Recent Labs  Lab 05/10/23 1604  CKTOTAL 50   BNP: Invalid input(s): "POCBNP" CBG: No results for input(s): "GLUCAP" in the last 168 hours. HbA1C: No results for input(s): "HGBA1C" in the last 72 hours. Urine analysis:    Component Value Date/Time   COLORURINE YELLOW 05/08/2023 1842   APPEARANCEUR HAZY (A) 05/08/2023 1842   APPEARANCEUR Clear 02/22/2017 1027   LABSPEC 1.019 05/08/2023 1842   PHURINE 6.0 05/08/2023 1842   GLUCOSEU NEGATIVE 05/08/2023 1842   HGBUR MODERATE (A) 05/08/2023 1842   BILIRUBINUR NEGATIVE 05/08/2023 1842   BILIRUBINUR Negative 02/22/2017 1027   KETONESUR NEGATIVE 05/08/2023  1842   PROTEINUR NEGATIVE 05/08/2023 1842   UROBILINOGEN 0.2 06/22/2008 1737   NITRITE NEGATIVE 05/08/2023 1842   LEUKOCYTESUR NEGATIVE 05/08/2023 1842   Sepsis Labs: @LABRCNTIP (procalcitonin:4,lacticidven:4) )No results found for this or any previous visit (from the past 240 hour(s)).   Scheduled Meds:  metoCLOPramide (REGLAN) injection  10 mg Intravenous Q6H   ondansetron (ZOFRAN) IV  4 mg Intravenous TID AC & HS   pantoprazole  40 mg Oral Daily   traZODone  50 mg Oral QHS   Continuous Infusions:  sodium chloride 100 mL/hr at 05/10/23 0753    Procedures/Studies: ECHOCARDIOGRAM COMPLETE  Result Date: 05/10/2023    ECHOCARDIOGRAM REPORT   Patient Name:   JAMIRACLE HAUSNER Date of Exam: 05/10/2023 Medical Rec #:  [id number]        Height:       66.0 in Accession #:    [id number]       Weight:  214.9 lb Date of Birth:  05/12/1971        BSA:          2.062 m Patient Age:    52 years         BP:           97/52 mmHg Patient Gender: F                HR:           88 bpm. [facility] Procedure: 2D Echo, 3D Echo, Cardiac Doppler and Color Doppler Indications:    Chest Pain R07.9  History:        Patient has no prior history of Echocardiogram examinations.                 Risk Factors:Hypertension, Dyslipidemia and Non-Smoker.  Sonographer:    Aron Baba Referring Phys: [phone] Eric Morganti IMPRESSIONS  1. Left ventricular ejection fraction, by estimation, is 60 to 65%. The left ventricle has normal function. The left ventricle has no regional wall motion abnormalities. Left ventricular diastolic parameters are consistent with Grade I diastolic dysfunction (impaired relaxation).  2. Right ventricular systolic function is normal. The right ventricular size is normal. There is normal pulmonary artery systolic pressure.  3. The mitral valve is normal in structure. Trivial mitral valve regurgitation. No evidence of mitral stenosis.  4. The aortic valve is tricuspid. Aortic valve regurgitation  is not visualized. Aortic valve sclerosis/calcification is present, without any evidence of aortic stenosis. Aortic valve mean gradient measures 8.0 mmHg. Aortic valve Vmax measures 1.75 m/s.  5. The inferior vena cava is normal in size with <50% respiratory variability, suggesting right atrial pressure of 8 mmHg. Comparison(s): No prior Echocardiogram. FINDINGS  Left Ventricle: Left ventricular ejection fraction, by estimation, is 60 to 65%. The left ventricle has normal function. The left ventricle has no regional wall motion abnormalities. The left ventricular internal cavity size was normal in size. There is  no left ventricular hypertrophy. Left ventricular diastolic parameters are consistent with Grade I diastolic dysfunction (impaired relaxation). Right Ventricle: The right ventricular size is normal. No increase in right ventricular wall thickness. Right ventricular systolic function is normal. There is normal pulmonary artery systolic pressure. The tricuspid regurgitant velocity is 1.09 m/s, and  with an assumed right atrial pressure of 15 mmHg, the estimated right ventricular systolic pressure is 19.8 mmHg. Left Atrium: Left atrial size was normal in size. Right Atrium: Right atrial size was normal in size. Pericardium: There is no evidence of pericardial effusion. Mitral Valve: The mitral valve is normal in structure. Trivial mitral valve regurgitation. No evidence of mitral valve stenosis. Tricuspid Valve: The tricuspid valve is grossly normal. Tricuspid valve regurgitation is trivial. No evidence of tricuspid stenosis. Aortic Valve: The aortic valve is tricuspid. Aortic valve regurgitation is not visualized. Aortic valve sclerosis/calcification is present, without any evidence of aortic stenosis. Aortic valve mean gradient measures 8.0 mmHg. Aortic valve peak gradient measures 12.2 mmHg. Aortic valve area, by VTI measures 1.44 cm. Pulmonic Valve: The pulmonic valve was not well visualized. Pulmonic  valve regurgitation is not visualized. No evidence of pulmonic stenosis. Aorta: The aortic root and ascending aorta are structurally normal, with no evidence of dilitation. Venous: The inferior vena cava is normal in size with less than 50% respiratory variability, suggesting right atrial pressure of 8 mmHg. IAS/Shunts: The interatrial septum was not well visualized.  LEFT VENTRICLE PLAX 2D LVIDd:  4.70 cm   Diastology LVIDs:         3.20 cm   LV e' medial:    10.20 cm/s LV PW:         1.00 cm   LV E/e' medial:  8.0 LV IVS:        0.70 cm   LV e' lateral:   8.38 cm/s LVOT diam:     1.80 cm   LV E/e' lateral: 9.7 LV SV:         46 LV SV Index:   22 LVOT Area:     2.54 cm                           3D Volume EF:                          3D EF:        61 %                          LV EDV:       109 ml                          LV ESV:       42 ml                          LV SV:        67 ml RIGHT VENTRICLE RV S prime:     15.80 cm/s TAPSE (M-mode): 2.2 cm LEFT ATRIUM             Index        RIGHT ATRIUM           Index LA diam:        2.60 cm 1.26 cm/m   RA Area:     18.10 cm LA Vol (A2C):   54.0 ml 26.19 ml/m  RA Volume:   40.90 ml  19.83 ml/m LA Vol (A4C):   38.9 ml 18.87 ml/m LA Biplane Vol: 46.1 ml 22.36 ml/m  AORTIC VALVE AV Area (Vmax):    1.63 cm AV Area (Vmean):   1.52 cm AV Area (VTI):     1.44 cm AV Vmax:           175.00 cm/s AV Vmean:          118.000 cm/s AV VTI:            0.318 m AV Peak Grad:      12.2 mmHg AV Mean Grad:      8.0 mmHg LVOT Vmax:         112.00 cm/s LVOT Vmean:        70.600 cm/s LVOT VTI:          0.180 m LVOT/AV VTI ratio: 0.57  AORTA Ao Root diam: 3.30 cm Ao Asc diam:  3.00 cm MITRAL VALVE                TRICUSPID VALVE MV Area (PHT): 3.99 cm     TR Peak grad:   4.8 mmHg MV Decel Time: 190 msec     TR Vmax:        109.00 cm/s MR Peak grad: 8.1 mmHg MR Vmax:      142.50 cm/s   SHUNTS MV E velocity:  81.50 cm/s   Systemic VTI:  0.18 m MV A velocity: 103.00 cm/s  Systemic  Diam: 1.80 cm MV E/A ratio:  0.79 Vishnu Priya Mallipeddi Electronically signed by Winfield Rast Mallipeddi Signature Date/Time: 05/10/2023/3:08:26 PM    Final    DG Bone Density  Result Date: 04/26/2023 EXAM: DUAL X-RAY ABSORPTIOMETRY (DXA) FOR BONE MINERAL DENSITY IMPRESSION: Your patient Ercelle Contessa completed a BMD test on 04/26/2023 using the [employer] DXA System (software version: 14.10) manufactured by [employer]. The following summarizes the results of our evaluation. Technologist:TNB PATIENT BIOGRAPHICAL: Name: Zaniah, Dou Patient ID: [id number] Birth Date: [birth year] Height: 66.0 in. Gender: Female Exam Date: 04/26/2023 Weight: 212.4 lbs. Indications: Low Calcium Intake, Pre Menopausal Fractures: Treatments: Vitamin D DENSITOMETRY RESULTS: Site         Region     Measured Date Measured Age Age Matched Z Score BMD         %Change vs. Previous Significant Change (*) [facility] Spine L1-L4 (L3) 04/26/2023 50.1 1.4 1.372 g/cm2 DualFemur Neck Right 04/26/2023 50.1 -0.5 0.990 g/cm2 Left Forearm Radius 33% 04/26/2023 50.1 -0.9 0.697 g/cm2 ASSESSMENT: The BMD measured at Femur Neck Right is 0.990 g/cm2 with a T-score of -0.5. This patient is considered normal according to World Health Organization [facility]) criteria. The scan quality is good. L was excluded due to advanced degenerative changes. Patient is not a candidate for FRAX assessment due to patient is premenopausal. World Health Organization [facility]) criteria for post-menopausal, Caucasian Women: Normal:       T-score at or above -1 SD Osteopenia:   T-score between -1 and -2.5 SD Osteoporosis: T-score at or below -2.5 SD RECOMMENDATIONS: All patients should optimize calcium and vitamin D intake. FOLLOW-UP: Patients with diagnosis of osteoporsis or at high risk for fracture should have regular bone mineral density tests. For patients eligible for Medicare, routine testing is allowed once every 2 years. The testing frequency can be increased to  one year for patients who have rapidly progressing disease, those who are receiving or discontinuing medical therapy to restore bone mass, or have additional risk factors. I have reviewed this report, and agreee with the above findings. [facility] Electronically Signed   By: Baird Lyons M.D.   On: 04/26/2023 09:41    Catarina Hartshorn, DO  [facility]  If 7PM-7AM, please contact night-coverage [email or web address] Password [email or web address] 05/10/2023, 5:24 PM   LOS: 1 day

## 2023-05-10 NOTE — Progress Notes (Signed)
Patient presented with an episode of emesis and stated it was yellow in color, patient stated she has been having left sided pain and nausea, stated the Zofran helped a little bit, Dr. Carren Rang made aware awaiting orders at this time.

## 2023-05-10 NOTE — Progress Notes (Signed)
  Echocardiogram 2D Echocardiogram has been performed.  Maren Reamer 05/10/2023, 2:54 PM

## 2023-05-11 DIAGNOSIS — I10 Essential (primary) hypertension: Secondary | ICD-10-CM | POA: Diagnosis not present

## 2023-05-11 MED ORDER — ONDANSETRON 8 MG PO TBDP: 8.0000 mg | ORAL_TABLET | Freq: Three times a day (TID) | ORAL | 1 refills | Status: AC

## 2023-05-11 MED ORDER — PANTOPRAZOLE SODIUM 40 MG PO TBEC: 40.0000 mg | DELAYED_RELEASE_TABLET | Freq: Two times a day (BID) | ORAL | 1 refills | Status: DC

## 2023-05-11 MED ORDER — FOLIC ACID 1 MG PO TABS: 1.0000 mg | ORAL_TABLET | Freq: Every day | ORAL | Status: DC

## 2023-05-11 MED ORDER — POTASSIUM CHLORIDE CRYS ER 20 MEQ PO TBCR
40.0000 meq | EXTENDED_RELEASE_TABLET | Freq: Once | ORAL | Status: AC
  Administered 2023-05-11: 40 meq via ORAL
  Filled 2023-05-11: qty 2

## 2023-05-11 MED ORDER — FOLIC ACID 1 MG PO TABS
1.0000 mg | ORAL_TABLET | Freq: Every day | ORAL | Status: DC
  Administered 2023-05-11: 1 mg via ORAL
  Filled 2023-05-11: qty 1

## 2023-05-11 MED ORDER — ONDANSETRON 4 MG PO TBDP: 8.0000 mg | ORAL_TABLET | Freq: Three times a day (TID) | ORAL | Status: DC | PRN

## 2023-05-11 NOTE — Discharge Summary (Addendum)
Physician Discharge Summary   Patient: Sabrina [name] MRN: [id number] DOB: 1971/06/14  Admit date:     05/08/2023  Discharge date: 05/11/23  Discharge Physician: Onalee Hua [place]aceland Wachter   PCP: Junie Spencer, FNP   Recommendations at discharge:   Please follow up with primary care provider within 1-2 weeks  Please repeat CMP in one week Follow up with endocrine, Dr. Fransico Him on 05/12/23     Hospital Course: 52 year old female with a history of GERD, hypertension presenting with 2 to 90-month history of myalgias, arthralgias, generalized fatigue and malaise, and nausea.  She has had intermittent chest discomfort and also.  She states that her arthralgias and myalgias do not have any particular pattern.  They can be constant over a period of 24 hours as well as intermittent.  There is no frank synovitis.  She denies any rashes.  He has not had fevers or chills.  She has had some nausea with intermittent emesis.  She denies headache, abdominal pain, diarrhea, hematochezia, melena.  She has some dyspnea on exertion.  She has had intermittent abdominal pain.  Patient reports that she is also perimenopausal and has been having irregular cycles. She went to her gynecologist who drew blood work noticed that she had an elevated 11.8.  And sent her to endocrinology. She saw endocrinology at the end of July and had more blood work done. She reports over the last 3 weeks her symptoms have become worse.  In the ED, the patient was afebrile and hemodynamically stable.  Sodium 138, potassium 3.4, bicarbonate 25, serum creatinine 0.98.  Corrected calcium 11.8.  The patient was started on IV fluids and calcitonin.  She was given a dose of Zometa. UA was negative for pyuria.   Overall, her myalgias and arthralgias improved slightly.  Her energy level also improved.  However she continues to have some nausea and malaise. Her corrected calcium gradually improved. Dr. Laural Benes discussed her case with endocrine, Dr. Fransico Him.  As such, the  patient's outpatient appointment was expedited to 05/12/23.  Assessment and Plan: Hypercalcemia -Corrected calcium peaked at 11.8 during the hospitalization -Continued IV fluids -Patient was given calcitonin x 4 and Zometa x 1 -Gradually improving -Secondary to primary hyperparathyroidism -Patient has follow-up with endocrine, Dr. Fransico Him on 8/9 -04/26/23 iPTH--100 -corrected calcium 10.0 on day of d/c   Primary hyperparathyroidism -Dr. Laural Benes spoke with endocrine, Dr. Fransico Him -Dr. 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   Chest pain -check troponin 2>>3 -Echo EF 60-65%, no WMA, G1DD, trivial MR   Myalgia/Arthalgia -CK 50 -no frank synovitis -normal LFTs -TSH 2.840 -ESR 8 - CRP 2.4    Intractable n/v -related to hypercalcemia -ATC zofran -short course reglan -increase pantoprazole to bid -overall improved with improving calcium and symptomatic treatment -able to tolerate diet at time of d/c -d/c home with zofran 8 mg ODT q AC   Generalized weakness -multifactorial including deconditioning, primary HPT, low folate -folate 4.9>>replete -B12--219 -TSH 2.840   Hypophosphatemia - Likely related to hyperparathyroidism - Phosphorus 1.9 - 1 packet of potassium phosphate ordered   Essential HTN -holding amlodipine and HCTZ due to soft BPs -will not restart hydrochlorothiazide due to elevation of Ca -will not restart amlodipine as BP well controlled  Hypokalemia -repleted           Consultants: none Procedures performed: none  Disposition: Home Diet recommendation:  Cardiac diet DISCHARGE MEDICATION: Allergies as of 05/11/2023   No Known Allergies      Medication  List     STOP taking these medications    amLODipine 5 MG tablet Commonly known as: NORVASC   hydrochlorothiazide 12.5 MG tablet Commonly known as: HYDRODIURIL   omeprazole 20 MG capsule Commonly known as: PRILOSEC Replaced by: pantoprazole 40 MG tablet        TAKE these medications    folic acid 1 MG tablet Commonly known as: FOLVITE Take 1 tablet (1 mg total) by mouth daily.   ondansetron 8 MG disintegrating tablet Commonly known as: ZOFRAN-ODT Take 1 tablet (8 mg total) by mouth 3 (three) times daily before meals. What changed:  medication strength how much to take when to take this reasons to take this   pantoprazole 40 MG tablet Commonly known as: PROTONIX Take 1 tablet (40 mg total) by mouth 2 (two) times daily. Replaces: omeprazole 20 MG capsule   SUMAtriptan 25 MG tablet Commonly known as: IMITREX TAKE 1 TABLET (25 MG) TOTAL BY MOUTH ONCE FOR 1 DOSE. MAY REPEAT IN 2 HOURS IF HEADACHE PERSISTS OR RECURS   traZODone 50 MG tablet Commonly known as: DESYREL TAKE 1 TABLET BY MOUTH AT BEDTIME What changed:  when to take this reasons to take this   Vitamin D (Ergocalciferol) 1.25 MG (50000 UNIT) Caps capsule Commonly known as: DRISDOL Take 1 capsule (50,000 Units total) by mouth every 7 (seven) days. What changed: when to take this        Discharge Exam: Filed Weights   05/08/23 1834 05/08/23 2302  Weight: 96.3 kg 97.5 kg   HEENT:  [place]/AT, No thrush, no icterus CV:  RRR, no rub, no S3, no S4 Lung:  CTA, no wheeze, no rhonchi Abd:  soft/+BS, NT Ext:  No edema, no lymphangitis, no synovitis, no rash   Condition at discharge: stable  The results of significant diagnostics from this hospitalization (including imaging, microbiology, ancillary and laboratory) are listed below for reference.   Imaging Studies: ECHOCARDIOGRAM COMPLETE  Result Date: 05/10/2023    ECHOCARDIOGRAM REPORT   Patient Name:   [name] Date of Exam: 05/10/2023 Medical Rec #:  [id number]        Height:       66.0 in Accession #:    [id number]       Weight:       214.9 lb Date of Birth:  1971/06/27        BSA:          2.062 m Patient Age:    52 years         BP:           97/52 mmHg Patient Gender: F                HR:           88  bpm. [facility] Procedure: 2D Echo, 3D Echo, Cardiac Doppler and Color Doppler Indications:    Chest Pain R07.9  History:        Patient has no prior history of Echocardiogram examinations.                 Risk Factors:Hypertension, Dyslipidemia and Non-Smoker.  Sonographer:    Aron Baba Referring Phys: [phone] Debralee Braaksma IMPRESSIONS  1. Left ventricular ejection fraction, by estimation, is 60 to 65%. The left ventricle has normal function. The left ventricle has no regional wall motion abnormalities. Left ventricular diastolic parameters are consistent with Grade I diastolic dysfunction (impaired relaxation).  2. Right ventricular systolic function is  normal. The right ventricular size is normal. There is normal pulmonary artery systolic pressure.  3. The mitral valve is normal in structure. Trivial mitral valve regurgitation. No evidence of mitral stenosis.  4. The aortic valve is tricuspid. Aortic valve regurgitation is not visualized. Aortic valve sclerosis/calcification is present, without any evidence of aortic stenosis. Aortic valve mean gradient measures 8.0 mmHg. Aortic valve Vmax measures 1.75 m/s.  5. The inferior vena cava is normal in size with <50% respiratory variability, suggesting right atrial pressure of 8 mmHg. Comparison(s): No prior Echocardiogram. FINDINGS  Left Ventricle: Left ventricular ejection fraction, by estimation, is 60 to 65%. The left ventricle has normal function. The left ventricle has no regional wall motion abnormalities. The left ventricular internal cavity size was normal in size. There is  no left ventricular hypertrophy. Left ventricular diastolic parameters are consistent with Grade I diastolic dysfunction (impaired relaxation). Right Ventricle: The right ventricular size is normal. No increase in right ventricular wall thickness. Right ventricular systolic function is normal. There is normal pulmonary artery systolic pressure. The tricuspid regurgitant  velocity is 1.09 m/s, and  with an assumed right atrial pressure of 15 mmHg, the estimated right ventricular systolic pressure is 19.8 mmHg. Left Atrium: Left atrial size was normal in size. Right Atrium: Right atrial size was normal in size. Pericardium: There is no evidence of pericardial effusion. Mitral Valve: The mitral valve is normal in structure. Trivial mitral valve regurgitation. No evidence of mitral valve stenosis. Tricuspid Valve: The tricuspid valve is grossly normal. Tricuspid valve regurgitation is trivial. No evidence of tricuspid stenosis. Aortic Valve: The aortic valve is tricuspid. Aortic valve regurgitation is not visualized. Aortic valve sclerosis/calcification is present, without any evidence of aortic stenosis. Aortic valve mean gradient measures 8.0 mmHg. Aortic valve peak gradient measures 12.2 mmHg. Aortic valve area, by VTI measures 1.44 cm. Pulmonic Valve: The pulmonic valve was not well visualized. Pulmonic valve regurgitation is not visualized. No evidence of pulmonic stenosis. Aorta: The aortic root and ascending aorta are structurally normal, with no evidence of dilitation. Venous: The inferior vena cava is normal in size with less than 50% respiratory variability, suggesting right atrial pressure of 8 mmHg. IAS/Shunts: The interatrial septum was not well visualized.  LEFT VENTRICLE PLAX 2D LVIDd:         4.70 cm   Diastology LVIDs:         3.20 cm   LV e' medial:    10.20 cm/s LV PW:         1.00 cm   LV E/e' medial:  8.0 LV IVS:        0.70 cm   LV e' lateral:   8.38 cm/s LVOT diam:     1.80 cm   LV E/e' lateral: 9.7 LV SV:         46 LV SV Index:   22 LVOT Area:     2.54 cm                           3D Volume EF:                          3D EF:        61 %                          LV EDV:       109  ml                          LV ESV:       42 ml                          LV SV:        67 ml RIGHT VENTRICLE RV S prime:     15.80 cm/s TAPSE (M-mode): 2.2 cm LEFT ATRIUM              Index        RIGHT ATRIUM           Index LA diam:        2.60 cm 1.26 cm/m   RA Area:     18.10 cm LA Vol (A2C):   54.0 ml 26.19 ml/m  RA Volume:   40.90 ml  19.83 ml/m LA Vol (A4C):   38.9 ml 18.87 ml/m LA Biplane Vol: 46.1 ml 22.36 ml/m  AORTIC VALVE AV Area (Vmax):    1.63 cm AV Area (Vmean):   1.52 cm AV Area (VTI):     1.44 cm AV Vmax:           175.00 cm/s AV Vmean:          118.000 cm/s AV VTI:            0.318 m AV Peak Grad:      12.2 mmHg AV Mean Grad:      8.0 mmHg LVOT Vmax:         112.00 cm/s LVOT Vmean:        70.600 cm/s LVOT VTI:          0.180 m LVOT/AV VTI ratio: 0.57  AORTA Ao Root diam: 3.30 cm Ao Asc diam:  3.00 cm MITRAL VALVE                TRICUSPID VALVE MV Area (PHT): 3.99 cm     TR Peak grad:   4.8 mmHg MV Decel Time: 190 msec     TR Vmax:        109.00 cm/s MR Peak grad: 8.1 mmHg MR Vmax:      142.50 cm/s   SHUNTS MV E velocity: 81.50 cm/s   Systemic VTI:  0.18 m MV A velocity: 103.00 cm/s  Systemic Diam: 1.80 cm MV E/A ratio:  0.79 Vishnu Priya Mallipeddi Electronically signed by Winfield Rast Mallipeddi Signature Date/Time: 05/10/2023/3:08:26 PM    Final    DG Bone Density  Result Date: 04/26/2023 EXAM: DUAL X-RAY ABSORPTIOMETRY (DXA) FOR BONE MINERAL DENSITY IMPRESSION: Your patient Ishana Fosnaugh completed a BMD test on 04/26/2023 using the [employer] DXA System (software version: 14.10) manufactured by [employer]. The following summarizes the results of our evaluation. Technologist:TNB PATIENT BIOGRAPHICAL: Name: Tacarra, Jann Patient ID: [id number] Birth Date: 08-13-1971 Height: 66.0 in. Gender: Female Exam Date: 04/26/2023 Weight: 212.4 lbs. Indications: Low Calcium Intake, Pre Menopausal Fractures: Treatments: Vitamin D DENSITOMETRY RESULTS: Site         Region     Measured Date Measured Age Age Matched Z Score BMD         %Change vs. Previous Significant Change (*) AP Spine L1-L4 (L3) 04/26/2023 50.1 1.6 1.372 g/cm2 DualFemur Neck Right  04/26/2023 50.1 -0.5 0.990 g/cm2 Left Forearm Radius 33% 04/26/2023 50.1 -0.9 0.697 g/cm2 ASSESSMENT: The BMD measured at Femur Neck Right is  0.990 g/cm2 with a T-score of -0.5. This patient is considered normal according to World Health Organization [facility]) criteria. The scan quality is good. L was excluded due to advanced degenerative changes. Patient is not a candidate for FRAX assessment due to patient is premenopausal. World Health Organization [facility]) criteria for post-menopausal, Caucasian Women: Normal:       T-score at or above -1 SD Osteopenia:   T-score between -1 and -2.5 SD Osteoporosis: T-score at or below -2.5 SD RECOMMENDATIONS: All patients should optimize calcium and vitamin D intake. FOLLOW-UP: Patients with diagnosis of osteoporsis or at high risk for fracture should have regular bone mineral density tests. For patients eligible for Medicare, routine testing is allowed once every 2 years. The testing frequency can be increased to one year for patients who have rapidly progressing disease, those who are receiving or discontinuing medical therapy to restore bone mass, or have additional risk factors. I have reviewed this report, and agreee with the above findings. [facility] Electronically Signed   By: Baird Lyons M.D.   On: 04/26/2023 09:41    Microbiology: Results for orders placed or performed during the hospital encounter of 10/21/21  Resp Panel by RT-PCR (Flu A&B, Covid) Nasopharyngeal Swab     Status: None   Collection Time: 10/21/21  8:37 AM   Specimen: Nasopharyngeal Swab; Nasopharyngeal(NP) swabs in vial transport medium  Result Value Ref Range Status   SARS Coronavirus 2 by RT PCR NEGATIVE NEGATIVE Final    Comment: (NOTE) SARS-CoV-2 target nucleic acids are NOT DETECTED.  The SARS-CoV-2 RNA is generally detectable in upper respiratory specimens during the acute phase of infection. The lowest concentration of SARS-CoV-2 viral copies this assay can detect is 138  copies/mL. A negative result does not preclude SARS-Cov-2 infection and should not be used as the sole basis for treatment or other patient management decisions. A negative result may occur with  improper specimen collection/handling, submission of specimen other than nasopharyngeal swab, presence of viral mutation(s) within the areas targeted by this assay, and inadequate number of viral copies(<138 copies/mL). A negative result must be combined with clinical observations, patient history, and epidemiological information. The expected result is Negative.  Fact Sheet for Patients:  BloggerCourse.com  Fact Sheet for Healthcare Providers:  SeriousBroker.it  This test is no t yet approved or cleared by the [place] FDA and  has been authorized for detection and/or diagnosis of SARS-CoV-2 by FDA under an Emergency Use Authorization (EUA). This EUA will remain  in effect (meaning this test can be used) for the duration of the COVID-19 declaration under Section 564(b)(1) of the Act, 21 U.S.C.section 360bbb-3(b)(1), unless the authorization is terminated  or revoked sooner.       Influenza A by PCR NEGATIVE NEGATIVE Final   Influenza B by PCR NEGATIVE NEGATIVE Final    Comment: (NOTE) The Xpert Xpress SARS-CoV-2/FLU/RSV plus assay is intended as an aid in the diagnosis of influenza from Nasopharyngeal swab specimens and should not be used as a sole basis for treatment. Nasal washings and aspirates are unacceptable for Xpert Xpress SARS-CoV-2/FLU/RSV testing.  Fact Sheet for Patients: BloggerCourse.com  Fact Sheet for Healthcare Providers: SeriousBroker.it  This test is not yet approved or cleared by the [place] FDA and has been authorized for detection and/or diagnosis of SARS-CoV-2 by FDA under an Emergency Use Authorization (EUA). This EUA will remain in effect (meaning  this test can be used) for the duration of the COVID-19 declaration under Section 564(b)(1)  of the Act, 21 U.S.C. section 360bbb-3(b)(1), unless the authorization is terminated or revoked.  Performed at [facility], [address], [place], [place] [address]   Urine Culture     Status: Abnormal   Collection Time: 10/21/21 11:35 AM   Specimen: Urine, Clean Catch  Result Value Ref Range Status   Specimen Description   Final    URINE, CLEAN CATCH Performed at [facility], [address], [place], [place] [address]    Special Requests   Final    NONE Performed at [facility], [address] [place], [place] [address]    Culture (A)  Final    <10,000 COLONIES/mL INSIGNIFICANT GROWTH Performed at [facility], [address] [place], [place] [address]    Report Status 10/22/2021 FINAL  Final    Labs: CBC: Recent Labs  Lab 05/08/23 1903 05/09/23 0454  WBC 5.7 5.3  NEUTROABS 3.2 3.0  HGB 12.0 11.8*  HCT 36.5 35.8*  MCV 94.8 95.0  PLT 251 251   Basic Metabolic Panel: Recent Labs  Lab 05/08/23 1903 05/09/23 0006 05/09/23 0454 05/10/23 0501 05/11/23 0437  NA 133* 135 138 136 134*  K 3.5 3.2* 3.4* 3.7 3.1*  CL 104 106 106 107 110  CO2 25 25 25 22  21*  GLUCOSE 100* 89 95 100* 91  BUN 11 8 10  5* <5*  CREATININE 0.98 0.84 0.83 0.73 0.74  CALCIUM 11.4* 10.8* 11.8* 10.2 9.2  MG 2.1  --  2.1  --   --   PHOS 1.9*  --  3.4  --   --    Liver Function Tests: Recent Labs  Lab 05/08/23 1903 05/09/23 0006 05/09/23 0454 05/10/23 0501 05/11/23 0437  AST 18 17 17   --  36  ALT 25 25 25   --  50*  ALKPHOS 60 55 61  --  45  BILITOT 0.5 0.6 0.6  --  0.5  PROT 6.8 6.8 6.4*  --  5.7*  ALBUMIN 3.9 3.8 3.5 3.8 3.0*   CBG: No results for input(s): "GLUCAP" in the last 168 hours.  Discharge time spent: greater than 30 minutes.  Signed: Catarina Hartshorn, MD [facility] 05/11/2023

## 2023-05-11 NOTE — Progress Notes (Signed)
Patient discharged with instructions given on medications a follow up visits verbalized understanding .Prescriptions sent to Pharmacy of choice documented on AVS. Iv discontinued, catheter intact. Accompanied by staff to an awaiting vehicle.

## 2023-05-12 ENCOUNTER — Ambulatory Visit: Payer: 59 | Admitting: "Endocrinology

## 2023-05-12 ENCOUNTER — Telehealth: Payer: Self-pay | Admitting: "Endocrinology

## 2023-05-12 ENCOUNTER — Encounter: Payer: Self-pay | Admitting: "Endocrinology

## 2023-05-12 ENCOUNTER — Telehealth: Payer: Self-pay

## 2023-05-12 DIAGNOSIS — E559 Vitamin D deficiency, unspecified: Secondary | ICD-10-CM

## 2023-05-12 DIAGNOSIS — E210 Primary hyperparathyroidism: Secondary | ICD-10-CM | POA: Diagnosis not present

## 2023-05-12 NOTE — Progress Notes (Signed)
05/12/2023, 11:15 AM   Endocrinology follow-up note  Subjective:    Patient ID: Sabrina [name], female    DOB: 1970/11/27, PCP Junie Spencer, FNP   Past Medical History:  Diagnosis Date   GERD (gastroesophageal reflux disease)    Hypertension    Past Surgical History:  Procedure Laterality Date   CESAREAN SECTION  06/30/2008   CHOLECYSTECTOMY N/A 12/25/2019   Procedure: LAPAROSCOPIC CHOLECYSTECTOMY WITH INTRAOPERATIVE CHOLANGIOGRAM;  Surgeon: Lucretia Roers, MD;  Location: [facility];  Service: General;  Laterality: N/A;   COLONOSCOPY N/A 12/04/2019    sigmoid diverticulosis.   ECTOPIC PREGNANCY SURGERY     ESOPHAGOGASTRODUODENOSCOPY N/A 12/04/2019   normal esophagus, stomach, and duodenum.    TUBAL LIGATION  06/30/2008   Social History   Socioeconomic History   Marital status: Married    Spouse name: Not on file   Number of children: Not on file   Years of education: Not on file   Highest education level: Not on file  Occupational History   Not on file  Tobacco Use   Smoking status: Never   Smokeless tobacco: Never  Vaping Use   Vaping status: Never Used  Substance and Sexual Activity   Alcohol use: No   Drug use: No   Sexual activity: Yes    Birth control/protection: Surgical    Comment: tubal  Other Topics Concern   Not on file  Social History Narrative   Not on file   Social Determinants of Health   Financial Resource Strain: Medium Risk (11/15/2022)   Overall Financial Resource Strain (CARDIA)    Difficulty of Paying Living Expenses: Somewhat hard  Food Insecurity: No Food Insecurity (05/08/2023)   Hunger Vital Sign    Worried About Running Out of Food in the Last Year: Never true    Ran Out of Food in the Last Year: Never true  Transportation Needs: No Transportation Needs (05/08/2023)   PRAPARE - Administrator, Civil Service (Medical): No    Lack of Transportation (Non-Medical): No   Physical Activity: Insufficiently Active (11/15/2022)   Exercise Vital Sign    Days of Exercise per Week: 1 day    Minutes of Exercise per Session: 20 min  Stress: Stress Concern Present (11/15/2022)   [employer] of Occupational Health - Occupational Stress Questionnaire    Feeling of Stress : Rather much  Social Connections: Socially Integrated (11/15/2022)   Social Connection and Isolation Panel [NHANES]    Frequency of Communication with Friends and Family: More than three times a week    Frequency of Social Gatherings with Friends and Family: Once a week    Attends Religious Services: More than 4 times per year    Active Member of [employer] or Organizations: Yes    Attends Engineer, structural: More than 4 times per year    Marital Status: Married   Family History  Problem Relation Age of Onset   Diabetes Mother    Hypertension Mother    Heart attack Father    Pulmonary embolism Sister    Hypertension Other    Diabetes Other    Breast cancer Neg Hx    Outpatient Encounter Medications as of 05/12/2023  Medication Sig  Cholecalciferol (VITAMIN D) 50 MCG (2000 UT) CAPS Take 2,000 Units by mouth daily with breakfast.   folic acid (FOLVITE) 1 MG tablet Take 1 tablet (1 mg total) by mouth daily.   ondansetron (ZOFRAN-ODT) 8 MG disintegrating tablet Take 1 tablet (8 mg total) by mouth 3 (three) times daily before meals.   pantoprazole (PROTONIX) 40 MG tablet Take 1 tablet (40 mg total) by mouth 2 (two) times daily.   SUMAtriptan (IMITREX) 25 MG tablet TAKE 1 TABLET (25 MG) TOTAL BY MOUTH ONCE FOR 1 DOSE. MAY REPEAT IN 2 HOURS IF HEADACHE PERSISTS OR RECURS   traZODone (DESYREL) 50 MG tablet TAKE 1 TABLET BY MOUTH AT BEDTIME (Patient taking differently: Take 50 mg by mouth at bedtime as needed for sleep.)   [DISCONTINUED] Vitamin D, Ergocalciferol, (DRISDOL) 1.25 MG (50000 UNIT) CAPS capsule Take 1 capsule (50,000 Units total) by mouth every 7 (seven) days. (Patient taking  differently: Take 50,000 Units by mouth every Saturday.)   No facility-administered encounter medications on file as of 05/12/2023.   ALLERGIES: No Known Allergies  VACCINATION STATUS: Immunization History  Administered Date(s) Administered   Moderna Sars-Covid-2 Vaccination 04/10/2020   PFIZER(Purple Top)SARS-COV-2 Vaccination 03/03/2020   Td 10/04/2011   Tdap 09/05/2022    HPI Sabrina [name] is 52 y.o. female who presents today with a medical history as above. she is being seen in follow-up after she was seen in consultation for hypercalcemia requested by Junie Spencer, FNP.   See notes from previous visit.  Her previsit labs show increasing hypercalcemia which was symptomatic and required hospital treatment with a dose of Zometa to which she has responded to normocalcemia.  Her 24-hour urine calcium is elevated- see below.   She has no prior history of parathyroid disorder nor such history of disorders in her family. She was found to have hypercalcemia at least on 2 separate occasions between December 2023 in February 2024.  She denies any history of nephrolithiasis, tachyarrhythmias, seizure disorders.  Her recent bone density did not show loss of bone mass.   The highest calcium she was found to have was 12.8, associated with high PTH.   She has vitamin D deficiency on treatment currently.  She denies to ever take calcium supplements.  Review of Systems  Constitutional: + Mildly fluctuating body weight , no fatigue, no subjective hyperthermia, no subjective hypothermia Eyes: no blurry vision, no xerophthalmia   Objective:       05/12/2023    9:04 AM 05/11/2023    2:45 PM 05/11/2023    9:05 AM  Vitals with BMI  Height 5\' 6"     Weight 210 lbs    BMI 33.91    Systolic 110 124 604  Diastolic 66 65 82  Pulse 72 79 91    BP 110/66   Pulse 72   Ht 5\' 6"  (1.676 m)   Wt 210 lb (95.3 kg)   LMP 04/17/2023   BMI 33.89 kg/m   Wt Readings from Last 3 Encounters:  05/12/23  210 lb (95.3 kg)  05/08/23 214 lb 15.2 oz (97.5 kg)  04/17/23 212 lb 6.4 oz (96.3 kg)    Physical Exam  Constitutional:  Body mass index is 33.89 kg/m.,  not in acute distress, normal state of mind Eyes: PERRLA, EOMI, no exophthalmos ENT: moist mucous membranes, no gross thyromegaly, no gross cervical lymphadenopathy   CMP ( most recent) CMP     Component Value Date/Time   NA 134 (L) 05/11/2023 5409  NA 138 11/15/2022 1439   K 3.1 (L) 05/11/2023 0437   CL 110 05/11/2023 0437   CO2 21 (L) 05/11/2023 0437   GLUCOSE 91 05/11/2023 0437   BUN <5 (L) 05/11/2023 0437   BUN 10 11/15/2022 1439   CREATININE 0.74 05/11/2023 0437   CREATININE 0.91 12/17/2014 1226   CALCIUM 9.2 05/11/2023 0437   PROT 5.7 (L) 05/11/2023 0437   PROT 6.6 11/15/2022 1439   ALBUMIN 3.0 (L) 05/11/2023 0437   ALBUMIN 4.2 11/15/2022 1439   AST 36 05/11/2023 0437   ALT 50 (H) 05/11/2023 0437   ALKPHOS 45 05/11/2023 0437   BILITOT 0.5 05/11/2023 0437   BILITOT 0.2 11/15/2022 1439   EGFR 67 11/15/2022 1439   GFRNONAA >60 05/11/2023 0437   GFRNONAA 76 01/25/2013 1614     Diabetic Labs (most recent): Lab Results  Component Value Date   HGBA1C 4.7 12/17/2014     Lipid Panel ( most recent) Lipid Panel     Component Value Date/Time   CHOL 186 09/05/2022 1440   TRIG 127 09/05/2022 1440   HDL 51 09/05/2022 1440   CHOLHDL 3.6 09/05/2022 1440   CHOLHDL 3.6 12/17/2014 1226   VLDL 25 12/17/2014 1226   LDLCALC 112 (H) 09/05/2022 1440   LABVLDL 23 09/05/2022 1440      Lab Results  Component Value Date   TSH 2.840 04/26/2023   TSH 1.280 09/05/2022   TSH 2.820 08/16/2021   TSH 1.630 08/13/2020   TSH 2.310 05/30/2019   FREET4 1.23 04/26/2023     May 09, 2023 PTH 87, calcium 10.8; April 26, 2023 PTH 100 calcium 12.8  Patient is status post a dose of Zometa in the hospital.  Assessment & Plan:   1. Hypercalcemia, hypercalciuria 2.  Primary hyperparathyroidism 3. Vitamin D deficiency  - I  have reviewed her new and available  records and clinically evaluated the patient. - Based on these reviews, she has worsening hypercalcemia now determined to be secondary to primary hyperparathyroidism which required hospitalization for IV Zometa.   Complications of an treated hyperparathyroidism was discussed with her. Her previsit labs show hypercalciuria, normal bone mineral density.  This patient will benefit from definitive treatment with parathyroidectomy.  I discussed and arrange referral for her to see Dr.  Darnell Level. She is made aware of the fact that she may need sestamibi scan before her surgery.   -She is encouraged to finish her current supplements of vitamin D2 50,000 units weekly, and maintain with vitamin D3 2000 daily.    She will return with repeat labs after her surgery for follow-up in 6 weeks.  - she is advised to maintain close follow up with Junie Spencer, FNP for primary care needs.    I spent  22  minutes in the care of the patient today including review of labs from Thyroid Function, CMP, and other relevant labs ; imaging/biopsy records (current and previous including abstractions from other facilities); face-to-face time discussing  her lab results and symptoms, medications doses, her options of short and long term treatment based on the latest standards of care / guidelines;   and documenting the encounter.  Sabrina [name]  participated in the discussions, expressed understanding, and voiced agreement with the above plans.  All questions were answered to her satisfaction. she is encouraged to contact clinic should she have any questions or concerns prior to her return visit.   Follow up plan: Return in about 6 weeks (around 06/23/2023) for F/U with  Labs after Surgery.   Marquis Lunch, MD [facility] [address] [place], [place] [address] Phone: [phone]  Fax: [phone]     05/12/2023, 11:15  AM  This note was partially dictated with voice recognition software. Similar sounding words can be transcribed inadequately or may not  be corrected upon review.

## 2023-05-12 NOTE — Telephone Encounter (Signed)
Pt will call once she gets her surgery date from Dr Gerrit Friends

## 2023-05-12 NOTE — Transitions of Care (Post Inpatient/ED Visit) (Signed)
   05/12/2023  Name: Sabrina [name] MRN: [id number] DOB: [birth year]  Today's TOC FU Call Status: Today's TOC FU Call Status:: Successful TOC FU Call Completed TOC FU Call Complete Date: 05/12/23  Transition Care Management Follow-up Telephone Call Date of Discharge: 05/11/23 [facility] Type of Discharge: Inpatient Admission Primary Inpatient Discharge Diagnosis:: hypercalcemia How have you been since you were released from the hospital?: Better Any questions or concerns?: No  Items Reviewed: Did you receive and understand the discharge instructions provided?: No Medications obtained,verified, and reconciled?: Yes (Medications Reviewed) Any new allergies since your discharge?: No Dietary orders reviewed?: Yes Do you have support at home?: Yes People in Home: spouse  Medications Reviewed Today: Medications Reviewed Today     Reviewed by Karena Addison, LPN (Licensed Practical Nurse) on 05/12/23 at 1002  Med List Status: <None>   Medication Order Taking? Sig Documenting Provider Last Dose Status Informant  Cholecalciferol (VITAMIN D) 50 MCG (2000 UT) CAPS [id number]  Take 2,000 Units by mouth daily with breakfast. [provider]  Active   folic acid (FOLVITE) 1 MG tablet [id number]  Take 1 tablet (1 mg total) by mouth daily. Catarina Hartshorn, MD  Active   ondansetron (ZOFRAN-ODT) 8 MG disintegrating tablet [id number]  Take 1 tablet (8 mg total) by mouth 3 (three) times daily before meals. Catarina Hartshorn, MD  Active   pantoprazole (PROTONIX) 40 MG tablet [id number]  Take 1 tablet (40 mg total) by mouth 2 (two) times daily. Catarina Hartshorn, MD  Active   SUMAtriptan (IMITREX) 25 MG tablet [id number] No TAKE 1 TABLET (25 MG) TOTAL BY MOUTH ONCE FOR 1 DOSE. MAY REPEAT IN 2 HOURS IF HEADACHE PERSISTS OR RECURS Hawks, Edilia Bo, FNP UNK Active Self, Pharmacy Records  traZODone (DESYREL) 50 MG tablet [id number] No TAKE 1 TABLET BY MOUTH AT BEDTIME  Patient taking differently:  Take 50 mg by mouth at bedtime as needed for sleep.   Adline Potter, NP Past Week Active Self, Pharmacy Records            Home Care and Equipment/Supplies: Were Home Health Services Ordered?: NA Any new equipment or medical supplies ordered?: NA  Functional Questionnaire: Do you need assistance with bathing/showering or dressing?: No Do you need assistance with meal preparation?: No Do you need assistance with eating?: No Do you have difficulty maintaining continence: No Do you need assistance with getting out of bed/getting out of a chair/moving?: No Do you have difficulty managing or taking your medications?: No  Follow up appointments reviewed: PCP Follow-up appointment confirmed?: NA Specialist Hospital Follow-up appointment confirmed?: Yes Date of Specialist follow-up appointment?: 05/12/23 Follow-Up Specialty Provider:: Endo Do you need transportation to your follow-up appointment?: No Do you understand care options if your condition(s) worsen?: Yes-patient verbalized understanding    SIGNATURE Karena Addison, LPN [facility] Nurse Health Advisor Direct Dial [phone]

## 2023-05-16 ENCOUNTER — Ambulatory Visit (INDEPENDENT_AMBULATORY_CARE_PROVIDER_SITE_OTHER): Payer: 59

## 2023-05-16 ENCOUNTER — Ambulatory Visit (INDEPENDENT_AMBULATORY_CARE_PROVIDER_SITE_OTHER): Payer: 59 | Admitting: Family

## 2023-05-16 ENCOUNTER — Encounter: Payer: Self-pay | Admitting: Family

## 2023-05-16 ENCOUNTER — Telehealth: Payer: Self-pay | Admitting: Family

## 2023-05-16 VITALS — BP 131/83 | HR 61 | Temp 98.1°F | Ht 66.0 in | Wt 214.0 lb

## 2023-05-16 DIAGNOSIS — Z09 Encounter for follow-up examination after completed treatment for conditions other than malignant neoplasm: Secondary | ICD-10-CM

## 2023-05-16 DIAGNOSIS — M25562 Pain in left knee: Secondary | ICD-10-CM | POA: Diagnosis not present

## 2023-05-16 MED ORDER — DICLOFENAC SODIUM 75 MG PO TBEC: 75.0000 mg | DELAYED_RELEASE_TABLET | Freq: Two times a day (BID) | ORAL | 2 refills | Status: DC

## 2023-05-16 NOTE — Telephone Encounter (Signed)
Pt says she recently got out of the hospital for elevated calcium levels. Says today she is having pain in both legs. Needs advise on what this could mean? Offered her an appt for today but she declined.

## 2023-05-16 NOTE — Progress Notes (Signed)
Subjective:    Patient ID: Sabrina [name], female    DOB: 02/17/1971, 52 y.o.   MRN: [id number]  Chief Complaint  Patient presents with   Knee Pain    And swollen elevated calcium level random pain started wrose yesterday pain past weekend was in rest. Was in hospital last week    Pt presents to the office today with bilateral knee pain that is worse in left that started Friday.   She was hospitalized for hypercalcemia. She is followed by Endo and will have parathyroid surgery.  Knee Pain  The incident occurred 5 to 7 days ago. There was no injury mechanism. The pain is present in the right knee and left knee. The quality of the pain is described as aching. The pain is at a severity of 8/10. The pain is moderate. The pain has been Intermittent since onset. Pertinent negatives include no numbness or tingling. She reports no foreign bodies present. The symptoms are aggravated by movement and weight bearing. She has tried acetaminophen, rest, non-weight bearing and NSAIDs for the symptoms. The treatment provided mild relief.      Review of Systems  Neurological:  Negative for tingling and numbness.  All other systems reviewed and are negative.      Objective:   Physical Exam Vitals reviewed.  Constitutional:      General: She is not in acute distress.    Appearance: She is well-developed.  HENT:     Head: Normocephalic and atraumatic.     Right Ear: Tympanic membrane normal.     Left Ear: Tympanic membrane normal.  Eyes:     Pupils: Pupils are equal, round, and reactive to light.  Neck:     Thyroid: No thyromegaly.  Cardiovascular:     Rate and Rhythm: Normal rate and regular rhythm.     Heart sounds: Normal heart sounds. No murmur heard. Pulmonary:     Effort: Pulmonary effort is normal. No respiratory distress.     Breath sounds: Normal breath sounds. No wheezing.  Abdominal:     General: Bowel sounds are normal. There is no distension.     Palpations: Abdomen is  soft.     Tenderness: There is no abdominal tenderness.  Musculoskeletal:        General: No tenderness.     Cervical back: Normal range of motion and neck supple.     Comments: Full ROM of bilateral knee, trace swelling of left upper thigh  Skin:    General: Skin is warm and dry.  Neurological:     Mental Status: She is alert and oriented to person, place, and time.     Cranial Nerves: No cranial nerve deficit.     Deep Tendon Reflexes: Reflexes are normal and symmetric.  Psychiatric:        Behavior: Behavior normal.        Thought Content: Thought content normal.        Judgment: Judgment normal.      BP 131/83   Pulse 61   Temp 98.1 F (36.7 C) (Temporal)   Ht 5\' 6"  (1.676 m)   Wt 214 lb (97.1 kg)   LMP 04/17/2023   SpO2 94%   BMI 34.54 kg/m      Assessment & Plan:  Sabrina [name] comes in today with chief complaint of Knee Pain (And swollen elevated calcium level random pain started wrose yesterday pain past weekend was in rest. Was in hospital last week )   Diagnosis and  orders addressed:  1. Left knee pain, unspecified chronicity Rest Diclofenac BID with food No other NSAID's Labs pending  - DG Knee 1-2 Views Left; Future - CBC with Differential/Platelet - CMP14+EGFR - Uric acid - diclofenac (VOLTAREN) 75 MG EC tablet; Take 1 tablet (75 mg total) by mouth 2 (two) times daily.  Dispense: 60 tablet; Refill: 2  2. Hypercalcemia - CBC with Differential/Platelet - CMP14+EGFR  3. Morbid obesity (HCC) - CBC with Differential/Platelet - CMP14+EGFR  4. Hospital discharge follow-up - CBC with Differential/Platelet - CMP14+EGFR   Labs pending Health Maintenance reviewed Diet and exercise encouraged  Follow up plan: Keep chronic follow up   Jannifer Rodney, FNP

## 2023-05-16 NOTE — Telephone Encounter (Signed)
Appt made

## 2023-05-16 NOTE — Patient Instructions (Signed)
Hypokalemia Hypokalemia means that the amount of potassium in the blood is lower than normal. Potassium is a mineral (electrolyte) that helps regulate the amount of fluid in the body. It also stimulates muscle tightening (contraction) and helps nerves work properly. Normally, most of the body's potassium is inside cells, and only a very small amount is in the blood. Because the amount in the blood is so small, minor changes to potassium levels in the blood can be life-threatening. What are the causes? This condition may be caused by: Antibiotic medicine. Diarrhea or vomiting. Taking too much of a medicine that helps you have a bowel movement (laxative) can cause diarrhea and lead to hypokalemia. Chronic kidney disease (CKD). Medicines that help the body get rid of excess fluid (diuretics). Eating disorders, such as anorexia or bulimia. Low magnesium levels in the body. Sweating a lot. What are the signs or symptoms? Symptoms of this condition include: Weakness. Constipation. Fatigue. Muscle cramps. Mental confusion. Skipped heartbeats or irregular heartbeat (palpitations). Tingling or numbness. How is this diagnosed? This condition is diagnosed with a blood test. How is this treated? This condition may be treated by: Taking potassium supplements. Adjusting the medicines that you take. Eating more foods that contain a lot of potassium. If your potassium level is very low, you may need to get potassium through an IV and be monitored in the hospital. Follow these instructions at home: Eating and drinking  Eat a healthy diet. A healthy diet includes fresh fruits and vegetables, whole grains, healthy fats, and lean proteins. If told, eat more foods that contain a lot of potassium. These include: Nuts, such as peanuts and pistachios. Seeds, such as sunflower seeds and pumpkin seeds. Peas, lentils, and lima beans. Whole grain and bran cereals and breads. Fresh fruits and vegetables,  such as apricots, avocado, bananas, cantaloupe, kiwi, oranges, tomatoes, asparagus, and potatoes. Juices, such as orange, tomato, and prune. Lean meats, including fish. Milk and milk products, such as yogurt. General instructions Take over-the-counter and prescription medicines only as told by your health care provider. This includes vitamins, natural food products, and supplements. Keep all follow-up visits. This is important. Contact a health care provider if: You have weakness that gets worse. You feel your heart pounding or racing. You vomit. You have diarrhea. You have diabetes and you have trouble keeping your blood sugar in your target range. Get help right away if: You have chest pain. You have shortness of breath. You have vomiting or diarrhea that lasts for more than 2 days. You faint. These symptoms may be an emergency. Get help right away. Call 911. Do not wait to see if the symptoms will go away. Do not drive yourself to the hospital. Summary Hypokalemia means that the amount of potassium in the blood is lower than normal. This condition is diagnosed with a blood test. Hypokalemia may be treated by taking potassium supplements, adjusting the medicines that you take, or eating more foods that are high in potassium. If your potassium level is very low, you may need to get potassium through an IV and be monitored in the hospital. This information is not intended to replace advice given to you by your health care provider. Make sure you discuss any questions you have with your health care provider. Document Revised: 06/03/2021 Document Reviewed: 06/03/2021 Elsevier Patient Education  2024 Elsevier Inc.  

## 2023-05-17 ENCOUNTER — Telehealth: Payer: Self-pay | Admitting: Family

## 2023-05-17 NOTE — Telephone Encounter (Signed)
Patient was seen on 8/13 and stated that on her AVS it said she was seen for her potasium and when she received the lab results on MyChart it looked like there were no issues with potassium. Explained to patient that the AVS explains what she was seen for, not saying that is what the problem is. She was made aware that the labs are sent to her MyChart at the same time as they are sent to the provider and that the provider is off on Wednesdays so once they are reviewed, a nurse will call her.   She wanted a message to be sent to PCP to make sure she did get a call back to discuss what else could be the issue.

## 2023-05-17 NOTE — Telephone Encounter (Signed)
Will make Christy aware of pts call. Will call once labs are reviewed.

## 2023-05-18 NOTE — Telephone Encounter (Signed)
Please see result note.   Jannifer Rodney, FNP

## 2023-05-31 ENCOUNTER — Ambulatory Visit: Payer: 59 | Admitting: "Endocrinology

## 2023-06-26 ENCOUNTER — Other Ambulatory Visit (HOSPITAL_COMMUNITY): Payer: Self-pay | Admitting: Surgery

## 2023-06-26 DIAGNOSIS — E213 Hyperparathyroidism, unspecified: Secondary | ICD-10-CM

## 2023-06-27 ENCOUNTER — Other Ambulatory Visit (HOSPITAL_COMMUNITY): Payer: Self-pay | Admitting: Surgery

## 2023-06-27 DIAGNOSIS — E210 Primary hyperparathyroidism: Secondary | ICD-10-CM

## 2023-06-28 ENCOUNTER — Ambulatory Visit (HOSPITAL_COMMUNITY)
Admission: RE | Admit: 2023-06-28 | Discharge: 2023-06-28 | Disposition: A | Discharge status: Home / Self Care | Payer: 59 | Source: Ambulatory Visit | Attending: Surgery | Admitting: Surgery

## 2023-06-28 ENCOUNTER — Encounter (HOSPITAL_COMMUNITY)
Admission: RE | Admit: 2023-06-28 | Discharge: 2023-06-28 | Disposition: A | Discharge status: Home / Self Care | Payer: 59 | Source: Ambulatory Visit | Attending: Surgery | Admitting: Surgery

## 2023-06-28 ENCOUNTER — Encounter (HOSPITAL_COMMUNITY): Payer: Self-pay

## 2023-06-28 DIAGNOSIS — E213 Hyperparathyroidism, unspecified: Secondary | ICD-10-CM | POA: Insufficient documentation

## 2023-06-28 DIAGNOSIS — E210 Primary hyperparathyroidism: Secondary | ICD-10-CM | POA: Insufficient documentation

## 2023-06-28 MED ORDER — TECHNETIUM TC 99M SESTAMIBI - CARDIOLITE
25.0000 | Freq: Once | INTRAVENOUS | Status: AC | PRN
  Administered 2023-06-28: 24 via INTRAVENOUS

## 2023-07-06 ENCOUNTER — Telehealth: Payer: Self-pay

## 2023-07-06 NOTE — Telephone Encounter (Signed)
Left a message requesting pt return call to the office. 

## 2023-07-17 NOTE — Progress Notes (Signed)
USN does not identify a parathyroid adenoma.  Await reading from nuclear scan performed 9/25.  [facility] Endo - please contact radiology about a final report.  Looks like there may be a left sided adenoma but really want to see the radiologist's opinion.  Darnell Level, MD [facility] practice Office: [phone]

## 2023-07-18 ENCOUNTER — Ambulatory Visit: Payer: Self-pay | Admitting: Surgery

## 2023-07-18 NOTE — Progress Notes (Signed)
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  Will enter orders for parathyroidectomy and send to schedulers to contact the patient.  Darnell Level, MD [facility] practice Office: [phone]

## 2023-07-21 NOTE — Patient Instructions (Signed)
DUE TO COVID-19 ONLY TWO VISITORS  (aged 50 and older)  ARE ALLOWED TO COME WITH YOU AND STAY IN THE WAITING ROOM ONLY DURING PRE OP AND PROCEDURE.   **NO VISITORS ARE ALLOWED IN THE SHORT STAY AREA OR RECOVERY ROOM!!**  IF YOU WILL BE ADMITTED INTO THE HOSPITAL YOU ARE ALLOWED ONLY FOUR SUPPORT PEOPLE DURING VISITATION HOURS ONLY (7 AM -8PM)   The support person(s) must pass our screening, gel in and out, and wear a mask at all times, including in the patient's room. Patients must also wear a mask when staff or their support person are in the room. Visitors GUEST BADGE MUST BE WORN VISIBLY  One adult visitor may remain with you overnight and MUST be in the room by 8 P.M.     Your procedure is scheduled on: 08/07/23   Report to [facility] Main Entrance    Report to admitting at : 8/45 AM   Call this number if you have problems the morning of surgery [phone]   Do not eat food :After Midnight.   After Midnight you may have the following liquids until : 8:00 AM DAY OF SURGERY  Water Black Coffee (sugar ok, NO MILK/CREAM OR CREAMERS)  Tea (sugar ok, NO MILK/CREAM OR CREAMERS) regular and decaf                             Plain Jell-O (NO RED)                                           Fruit ices (not with fruit pulp, NO RED)                                     Popsicles (NO RED)                                                                  Juice: apple, WHITE grape, WHITE cranberry Sports drinks like Gatorade (NO RED)             FOLLOW ANY ADDITIONAL PRE OP INSTRUCTIONS YOU RECEIVED FROM YOUR SURGEON'S OFFICE!!!   Oral Hygiene is also important to reduce your risk of infection.                                    Remember - BRUSH YOUR TEETH THE MORNING OF SURGERY WITH YOUR REGULAR TOOTHPASTE  DENTURES WILL BE REMOVED PRIOR TO SURGERY PLEASE DO NOT APPLY "Poly grip" OR ADHESIVES!!!   Do NOT smoke after Midnight   Take these medicines the morning of surgery with A  SIP OF WATER: pantoprazole.Sumatriptan as needed.                              You may not have any metal on your body including hair pins, jewelry, and body piercing  Do not wear make-up, lotions, powders, perfumes/cologne, or deodorant  Do not wear nail polish including gel and S&S, artificial/acrylic nails, or any other type of covering on natural nails including finger and toenails. If you have artificial nails, gel coating, etc. that needs to be removed by a nail salon please have this removed prior to surgery or surgery may need to be canceled/ delayed if the surgeon/ anesthesia feels like they are unable to be safely monitored.   Do not shave  48 hours prior to surgery.    Do not bring valuables to the hospital. [facility] IS NOT             RESPONSIBLE   FOR VALUABLES.   Contacts, glasses, or bridgework may not be worn into surgery.   Bring small overnight bag day of surgery.   DO NOT BRING YOUR HOME MEDICATIONS TO THE HOSPITAL. PHARMACY WILL DISPENSE MEDICATIONS LISTED ON YOUR MEDICATION LIST TO YOU DURING YOUR ADMISSION IN THE HOSPITAL!    Patients discharged on the day of surgery will not be allowed to drive home.  Someone NEEDS to stay with you for the first 24 hours after anesthesia.   Special Instructions: Bring a copy of your healthcare power of attorney and living will documents         the day of surgery if you haven't scanned them before.              Please read over the following fact sheets you were given: IF YOU HAVE QUESTIONS ABOUT YOUR PRE-OP INSTRUCTIONS PLEASE CALL [phone]    [facility] - Preparing for Surgery Before surgery, you can play an important role.  Because skin is not sterile, your skin needs to be as free of germs as possible.  You can reduce the number of germs on your skin by washing with CHG (chlorahexidine gluconate) soap before surgery.  CHG is an antiseptic cleaner which kills germs and bonds with the skin to continue killing  germs even after washing. Please DO NOT use if you have an allergy to CHG or antibacterial soaps.  If your skin becomes reddened/irritated stop using the CHG and inform your nurse when you arrive at Short Stay. Do not shave (including legs and underarms) for at least 48 hours prior to the first CHG shower.  You may shave your face/neck. Please follow these instructions carefully:  1.  Shower with CHG Soap the night before surgery and the  morning of Surgery.  2.  If you choose to wash your hair, wash your hair first as usual with your  normal  shampoo.  3.  After you shampoo, rinse your hair and body thoroughly to remove the  shampoo.                           4.  Use CHG as you would any other liquid soap.  You can apply chg directly  to the skin and wash                       Gently with a scrungie or clean washcloth.  5.  Apply the CHG Soap to your body ONLY FROM THE NECK DOWN.   Do not use on face/ open                           Wound or open sores. Avoid contact with eyes,  ears mouth and genitals (private parts).                       Wash face,  Genitals (private parts) with your normal soap.             6.  Wash thoroughly, paying special attention to the area where your surgery  will be performed.  7.  Thoroughly rinse your body with warm water from the neck down.  8.  DO NOT shower/wash with your normal soap after using and rinsing off  the CHG Soap.                9.  Pat yourself dry with a clean towel.            10.  Wear clean pajamas.            11.  Place clean sheets on your bed the night of your first shower and do not  sleep with pets. Day of Surgery : Do not apply any lotions/deodorants the morning of surgery.  Please wear clean clothes to the hospital/surgery center.  FAILURE TO FOLLOW THESE INSTRUCTIONS MAY RESULT IN THE CANCELLATION OF YOUR SURGERY PATIENT SIGNATURE_________________________________  NURSE  SIGNATURE__________________________________  ________________________________________________________________________

## 2023-07-25 ENCOUNTER — Encounter (HOSPITAL_COMMUNITY)
Admission: RE | Admit: 2023-07-25 | Discharge: 2023-07-25 | Disposition: A | Discharge status: Home / Self Care | Payer: 59 | Source: Ambulatory Visit | Attending: Surgery | Admitting: Surgery

## 2023-07-25 ENCOUNTER — Other Ambulatory Visit: Payer: Self-pay

## 2023-07-25 ENCOUNTER — Encounter (HOSPITAL_COMMUNITY): Payer: Self-pay

## 2023-07-25 VITALS — BP 160/99 | HR 75 | Temp 98.1°F | Ht 64.0 in | Wt 213.0 lb

## 2023-07-25 DIAGNOSIS — I10 Essential (primary) hypertension: Secondary | ICD-10-CM | POA: Diagnosis not present

## 2023-07-25 DIAGNOSIS — Z01818 Encounter for other preprocedural examination: Secondary | ICD-10-CM

## 2023-07-25 DIAGNOSIS — Z01812 Encounter for preprocedural laboratory examination: Secondary | ICD-10-CM | POA: Diagnosis present

## 2023-07-25 HISTORY — DX: Abnormal levels of other serum enzymes: R74.8

## 2023-07-25 LAB — CBC
HCT: 39.4 % (ref 36.0–46.0)
Hemoglobin: 12.5 g/dL (ref 12.0–15.0)
MCH: 30.6 pg (ref 26.0–34.0)
MCHC: 31.7 g/dL (ref 30.0–36.0)
MCV: 96.3 fL (ref 80.0–100.0)
Platelets: 255 10*3/uL (ref 150–400)
RBC: 4.09 MIL/uL (ref 3.87–5.11)
RDW: 11.5 % (ref 11.5–15.5)
WBC: 5 10*3/uL (ref 4.0–10.5)
nRBC: 0 % (ref 0.0–0.2)

## 2023-07-25 LAB — BASIC METABOLIC PANEL
Anion gap: 6 (ref 5–15)
BUN: 13 mg/dL (ref 6–20)
CO2: 23 mmol/L (ref 22–32)
Calcium: 9.9 mg/dL (ref 8.9–10.3)
Chloride: 108 mmol/L (ref 98–111)
Creatinine, Ser: 0.93 mg/dL (ref 0.44–1.00)
GFR, Estimated: >60 mL/min (ref >60)
Glucose, Bld: 91 mg/dL (ref 70–99)
Potassium: 4.1 mmol/L (ref 3.5–5.1)
Sodium: 137 mmol/L (ref 135–145)

## 2023-07-25 NOTE — Progress Notes (Addendum)
For Short Stay: COVID SWAB appointment date:  Bowel Prep reminder:   For Anesthesia: PCP - Junie Spencer, FNP  Cardiologist - N/A  Chest x-ray -  EKG - 05/08/23 Stress Test -  ECHO - 05/10/23 Cardiac Cath -  Pacemaker/ICD device last checked: Pacemaker orders received: Device Rep notified:  Spinal Cord Stimulator: N/A  Sleep Study - N/A CPAP -   Fasting Blood Sugar - N/A Checks Blood Sugar _____ times a day Date and result of last Hgb A1c-  Last dose of GLP1 agonist- N/A GLP1 instructions:   Last dose of SGLT-2 inhibitors- N/A SGLT-2 instructions:   Blood Thinner Instructions:N/A Aspirin Instructions: Last Dose:  Activity level: Can go up a flight of stairs and activities of daily living without stopping and without chest pain and/or shortness of breath   Able to exercise without chest pain and/or shortness of breath    Anesthesia review: Hx: HTN. BP was elevated at PST appointment.: 163/107;160/99. Pt. Was advised to monitor BP at home for at least three days,and to notify PCP, if BP continue elevated.Pt. was informed about of chance for cancellation if BP is elevated the day of surgery.She verbalized her understanding,and will call back the RN, if PCP starts her on BP medicines again.  Patient denies shortness of breath, fever, cough and chest pain at PAT appointment   Patient verbalized understanding of instructions that were given to them at the PAT appointment. Patient was also instructed that they will need to review over the PAT instructions again at home before surgery.

## 2023-08-06 ENCOUNTER — Encounter (HOSPITAL_COMMUNITY): Payer: Self-pay | Admitting: Surgery

## 2023-08-06 NOTE — H&P (Signed)
REFERRING PHYSICIAN: Purcell Nails  PROVIDER: Jashanti Clinkscale Myra Rude, MD   Chief Complaint: New Consultation (Primary hyperparathyroidism)  History of Present Illness:  Patient is furred by Dr. Purcell Nails for surgical evaluation and management of newly diagnosed primary hyperparathyroidism. Patient had been noted to have elevated serum calcium levels. Patient had an episode where she became more symptomatic with fatigue and leg swelling. She noted confusion. Laboratory studies showed an elevated calcium as high as 12.8. She was admitted to the hospital and treated with IV Zometa. Patient has had calcium levels ranging from 10.2-11.8. Intact PTH level has ranged from 87-100. 24-hour urine collection for calcium was performed and was elevated at 438. Patient has been treated for vitamin D insufficiency. Her most recent 25-hydroxy vitamin D level is normal at 49.37. Patient denies nephrolithiasis. She has had a bone density scan which did not show evidence of osteopenia or osteoporosis. Patient has had no prior head or neck surgery. There is a family history of thyroid disease in multiple remote family members but not in any immediate family members. Patient has not had any imaging studies performed. She works Retail banker for [employer].  Review of Systems: A complete review of systems was obtained from the patient. I have reviewed this information and discussed as appropriate with the patient. See HPI as well for other ROS.  Review of Systems  Constitutional: Positive for malaise/fatigue.  HENT: Negative.  Eyes: Negative.  Respiratory: Negative.  Cardiovascular: Positive for leg swelling.  Gastrointestinal: Negative.  Genitourinary: Negative.  Musculoskeletal: Positive for joint pain.  Skin: Negative.  Neurological: Negative.  Endo/Heme/Allergies: Negative.  Psychiatric/Behavioral: Positive for memory loss.    Medical History: Past Medical History:   Diagnosis Date  GERD (gastroesophageal reflux disease)  Hypertension   Patient Active Problem List  Diagnosis  GERD (gastroesophageal reflux disease)  Hypercalcemia  Hyperlipemia  Hyperparathyroidism (CMS/HHS-HCC)  Morbid obesity (CMS/HHS-HCC)  Vitamin D deficiency   Past Surgical History:  Procedure Laterality Date  CESAREAN SECTION 06/30/2008  ESSURE TUBAL LIGATION 06/30/2008  CHOLECYSTECTOMY 12/25/2019    No Known Allergies  Current Outpatient Medications on File Prior to Visit  Medication Sig Dispense Refill  cholecalciferol (VITAMIN D3) 2,000 unit capsule Take by mouth  diclofenac (VOLTAREN) 75 MG EC tablet Take 75 mg by mouth 2 (two) times daily  folic acid (FOLVITE) 1 MG tablet Take 1 mg by mouth once daily  ondansetron (ZOFRAN-ODT) 8 MG disintegrating tablet Take by mouth  pantoprazole (PROTONIX) 40 MG DR tablet Take 1 tablet by mouth 2 (two) times daily  traZODone (DESYREL) 50 MG tablet Take 1 tablet by mouth at bedtime  SUMAtriptan (IMITREX) 25 MG tablet TAKE 1 TABLET (25 MG) TOTAL BY MOUTH ONCE FOR 1 DOSE. MAY REPEAT IN 2 HOURS IF HEADACHE PERSISTS OR RECURS (Patient not taking: Reported on 06/22/2023)   No current facility-administered medications on file prior to visit.   Family History  Problem Relation Age of Onset  High blood pressure (Hypertension) Mother  Diabetes Mother  Myocardial Infarction (Heart attack) Father  Pulmonary embolism Sister    Social History   Tobacco Use  Smoking Status Never  Smokeless Tobacco Never    Social History   Socioeconomic History  Marital status: Married  Tobacco Use  Smoking status: Never  Smokeless tobacco: Never  Substance and Sexual Activity  Alcohol use: Never  Drug use: Never   Social Determinants of Health   Financial Resource Strain: Medium Risk (11/15/2022)  Received from [facility]  Overall Financial Resource Strain (CARDIA)  Difficulty of Paying Living Expenses: Somewhat hard  Food  Insecurity: No Food Insecurity (05/08/2023)  Received from [facility]  Hunger Vital Sign  Worried About Running Out of Food in the Last Year: Never true  Ran Out of Food in the Last Year: Never true  Transportation Needs: No Transportation Needs (05/08/2023)  Received from [facility] - Transportation  Lack of Transportation (Medical): No  Lack of Transportation (Non-Medical): No  Physical Activity: Insufficiently Active (11/15/2022)  Received from [facility]  Exercise Vital Sign  Days of Exercise per Week: 1 day  Minutes of Exercise per Session: 20 min  Stress: Stress Concern Present (11/15/2022)  Received from [facility] - Occupational Stress Questionnaire  Feeling of Stress : Rather much  Social Connections: Socially Integrated (11/15/2022)  Received from [facility]  Social Connection and Isolation Panel [NHANES]  Frequency of Communication with Friends and Family: More than three times a week  Frequency of Social Gatherings with Friends and Family: Once a week  Attends Religious Services: More than 4 times per year  Active Member of [employer] or Organizations: Yes  Attends Engineer, structural: More than 4 times per year  Marital Status: Married   Objective:   Vitals:  BP: 132/88  Pulse: 92  Temp: 36.8 C (98.3 F)  SpO2: 96%  Weight: 98.2 kg (216 lb 6.4 oz)  Height: 162.6 cm (5\' 4" )  PainSc: 0-No pain   Body mass index is 37.14 kg/m.  Physical Exam   GENERAL APPEARANCE Comfortable, no acute issues Development: normal Gross deformities: none  SKIN Rash, lesions, ulcers: none Induration, erythema: none Nodules: none palpable  EYES Conjunctiva and lids: normal Pupils: equal and reactive  EARS, NOSE, MOUTH, THROAT External ears: no lesion or deformity External nose: no lesion or deformity Hearing: grossly normal  NECK Symmetric: yes Trachea: midline Thyroid: no palpable nodules in the thyroid  bed  ABDOMEN Not assessed  GENITOURINARY/RECTAL Not assessed  MUSCULOSKELETAL Station and gait: normal Digits and nails: no clubbing or cyanosis Muscle strength: grossly normal all extremities Range of motion: grossly normal all extremities Deformity: none  LYMPHATIC Cervical: none palpable Supraclavicular: none palpable  PSYCHIATRIC Oriented to person, place, and time: yes Mood and affect: normal for situation Judgment and insight: appropriate for situation   Assessment and Plan:   Primary hyperparathyroidism (CMS/HHS-HCC)  Patient is referred by her endocrinologist for surgical evaluation and management of newly diagnosed primary hyperparathyroidism.  Patient provided with a copy of "Parathyroid Surgery: Treatment for Your Parathyroid Gland Problem", published by [employer], 12 pages. Book reviewed and explained to patient during visit today.  Today we reviewed her clinical history. We reviewed her laboratory studies. I would like to proceed with imaging studies to include an ultrasound examination of the neck as well as a nuclear medicine parathyroid scan with sestamibi. We discussed these studies. If these indicate the presence of a parathyroid adenoma, then I believe she will be a good candidate for minimally invasive surgery. We discussed the procedure. We discussed the size and location of the surgical incision. We discussed doing this as an outpatient procedure. We discussed potential complications including recurrent laryngeal nerve injury. We discussed her postoperative recovery and returned to work and activities. If the studies failed to identify the adenoma, then we will obtain a 4D CT scan with parathyroid protocol in hopes of localizing the adenoma and confirming her diagnosis.  Patient understands and agrees  to proceed with the above imaging studies. I will be in contact about the results and we will make plans for further management at that time.  Darnell Level,  MD [facility] practice Office: [phone]

## 2023-08-07 ENCOUNTER — Ambulatory Visit (HOSPITAL_COMMUNITY)
Admission: RE | Admit: 2023-08-07 | Discharge: 2023-08-07 | Disposition: A | Discharge status: Home / Self Care | Payer: 59 | Source: Ambulatory Visit | Attending: Surgery | Admitting: Surgery

## 2023-08-07 ENCOUNTER — Ambulatory Visit (HOSPITAL_BASED_OUTPATIENT_CLINIC_OR_DEPARTMENT_OTHER): Payer: 59 | Admitting: Certified Registered"

## 2023-08-07 ENCOUNTER — Ambulatory Visit (HOSPITAL_COMMUNITY): Payer: 59 | Admitting: Certified Registered"

## 2023-08-07 ENCOUNTER — Encounter (HOSPITAL_COMMUNITY)
Admission: RE | Disposition: A | Discharge status: Home / Self Care | Payer: Self-pay | Source: Ambulatory Visit | Attending: Surgery

## 2023-08-07 ENCOUNTER — Other Ambulatory Visit (HOSPITAL_COMMUNITY): Payer: Self-pay

## 2023-08-07 ENCOUNTER — Other Ambulatory Visit: Payer: Self-pay

## 2023-08-07 ENCOUNTER — Encounter (HOSPITAL_COMMUNITY): Payer: Self-pay | Admitting: Surgery

## 2023-08-07 DIAGNOSIS — Z5986 Financial insecurity: Secondary | ICD-10-CM | POA: Diagnosis not present

## 2023-08-07 DIAGNOSIS — E210 Primary hyperparathyroidism: Secondary | ICD-10-CM

## 2023-08-07 DIAGNOSIS — K219 Gastro-esophageal reflux disease without esophagitis: Secondary | ICD-10-CM | POA: Diagnosis not present

## 2023-08-07 DIAGNOSIS — Z6837 Body mass index (BMI) 37.0-37.9, adult: Secondary | ICD-10-CM | POA: Insufficient documentation

## 2023-08-07 DIAGNOSIS — Z79899 Other long term (current) drug therapy: Secondary | ICD-10-CM | POA: Insufficient documentation

## 2023-08-07 DIAGNOSIS — D351 Benign neoplasm of parathyroid gland: Secondary | ICD-10-CM | POA: Diagnosis not present

## 2023-08-07 DIAGNOSIS — Z01818 Encounter for other preprocedural examination: Secondary | ICD-10-CM

## 2023-08-07 DIAGNOSIS — I10 Essential (primary) hypertension: Secondary | ICD-10-CM | POA: Diagnosis not present

## 2023-08-07 DIAGNOSIS — Z8249 Family history of ischemic heart disease and other diseases of the circulatory system: Secondary | ICD-10-CM | POA: Diagnosis not present

## 2023-08-07 HISTORY — PX: PARATHYROIDECTOMY: SHX19

## 2023-08-07 LAB — POCT PREGNANCY, URINE: Preg Test, Ur: NEGATIVE

## 2023-08-07 SURGERY — PARATHYROIDECTOMY
Anesthesia: General | Laterality: Left

## 2023-08-07 MED ORDER — BUPIVACAINE HCL 0.25 % IJ SOLN
INTRAMUSCULAR | Status: AC
  Filled 2023-08-07: qty 1

## 2023-08-07 MED ORDER — CHLORHEXIDINE GLUCONATE CLOTH 2 % EX PADS: 6.0000 | MEDICATED_PAD | Freq: Once | CUTANEOUS | Status: DC

## 2023-08-07 MED ORDER — TRAMADOL HCL 50 MG PO TABS
50.0000 mg | ORAL_TABLET | Freq: Four times a day (QID) | ORAL | 0 refills | Status: DC | PRN
  Filled 2023-08-07: qty 12, 3d supply, fill #0

## 2023-08-07 MED ORDER — FENTANYL CITRATE (PF) 100 MCG/2ML IJ SOLN
INTRAMUSCULAR | Status: AC
  Filled 2023-08-07: qty 2

## 2023-08-07 MED ORDER — LIDOCAINE 2% (20 MG/ML) 5 ML SYRINGE
INTRAMUSCULAR | Status: DC | PRN
  Administered 2023-08-07: 60 mg via INTRAVENOUS

## 2023-08-07 MED ORDER — MIDAZOLAM HCL 2 MG/2ML IJ SOLN
INTRAMUSCULAR | Status: AC
  Filled 2023-08-07: qty 2

## 2023-08-07 MED ORDER — CEFAZOLIN SODIUM-DEXTROSE 2-4 GM/100ML-% IV SOLN
2.0000 g | INTRAVENOUS | Status: AC
  Administered 2023-08-07: 2 g via INTRAVENOUS
  Filled 2023-08-07: qty 100

## 2023-08-07 MED ORDER — HYDROMORPHONE HCL 1 MG/ML IJ SOLN
0.2500 mg | INTRAMUSCULAR | Status: DC | PRN
Start: 2023-08-07 — End: 2023-08-07
  Administered 2023-08-07 (×2): 0.25 mg via INTRAVENOUS

## 2023-08-07 MED ORDER — DEXAMETHASONE SODIUM PHOSPHATE 10 MG/ML IJ SOLN
INTRAMUSCULAR | Status: AC
  Filled 2023-08-07: qty 1

## 2023-08-07 MED ORDER — HYDROMORPHONE HCL 1 MG/ML IJ SOLN
INTRAMUSCULAR | Status: AC
  Filled 2023-08-07: qty 1

## 2023-08-07 MED ORDER — SUGAMMADEX SODIUM 200 MG/2ML IV SOLN
INTRAVENOUS | Status: DC | PRN
  Administered 2023-08-07: 200 mg via INTRAVENOUS

## 2023-08-07 MED ORDER — LACTATED RINGERS IV SOLN: INTRAVENOUS | Status: DC

## 2023-08-07 MED ORDER — HEMOSTATIC AGENTS (NO CHARGE) OPTIME
TOPICAL | Status: DC | PRN
  Administered 2023-08-07: 1 via TOPICAL

## 2023-08-07 MED ORDER — FENTANYL CITRATE (PF) 100 MCG/2ML IJ SOLN
INTRAMUSCULAR | Status: DC | PRN
  Administered 2023-08-07: 25 ug via INTRAVENOUS
  Administered 2023-08-07: 100 ug via INTRAVENOUS

## 2023-08-07 MED ORDER — ACETAMINOPHEN 500 MG PO TABS
1000.0000 mg | ORAL_TABLET | Freq: Once | ORAL | Status: DC
Start: 2023-08-07 — End: 2023-08-07

## 2023-08-07 MED ORDER — ROCURONIUM BROMIDE 10 MG/ML (PF) SYRINGE
PREFILLED_SYRINGE | INTRAVENOUS | Status: DC | PRN
  Administered 2023-08-07: 50 mg via INTRAVENOUS

## 2023-08-07 MED ORDER — DEXAMETHASONE SODIUM PHOSPHATE 10 MG/ML IJ SOLN
INTRAMUSCULAR | Status: DC | PRN
  Administered 2023-08-07: 8 mg via INTRAVENOUS

## 2023-08-07 MED ORDER — ROCURONIUM BROMIDE 10 MG/ML (PF) SYRINGE
PREFILLED_SYRINGE | INTRAVENOUS | Status: AC
  Filled 2023-08-07: qty 10

## 2023-08-07 MED ORDER — ONDANSETRON HCL 4 MG/2ML IJ SOLN
INTRAMUSCULAR | Status: DC | PRN
  Administered 2023-08-07: 4 mg via INTRAVENOUS

## 2023-08-07 MED ORDER — PROPOFOL 10 MG/ML IV BOLUS
INTRAVENOUS | Status: AC
  Filled 2023-08-07: qty 20

## 2023-08-07 MED ORDER — MIDAZOLAM HCL 2 MG/2ML IJ SOLN
INTRAMUSCULAR | Status: DC | PRN
  Administered 2023-08-07: 2 mg via INTRAVENOUS

## 2023-08-07 MED ORDER — CHLORHEXIDINE GLUCONATE 0.12 % MT SOLN
15.0000 mL | Freq: Once | OROMUCOSAL | Status: AC
  Administered 2023-08-07: 15 mL via OROMUCOSAL

## 2023-08-07 MED ORDER — ONDANSETRON HCL 4 MG/2ML IJ SOLN
INTRAMUSCULAR | Status: AC
Start: 2023-08-07 — End: ?
  Filled 2023-08-07: qty 2

## 2023-08-07 MED ORDER — 0.9 % SODIUM CHLORIDE (POUR BTL) OPTIME
TOPICAL | Status: DC | PRN
  Administered 2023-08-07: 1000 mL

## 2023-08-07 MED ORDER — BUPIVACAINE HCL 0.25 % IJ SOLN
INTRAMUSCULAR | Status: DC | PRN
  Administered 2023-08-07: 6 mL

## 2023-08-07 MED ORDER — PROPOFOL 10 MG/ML IV BOLUS
INTRAVENOUS | Status: DC | PRN
  Administered 2023-08-07: 150 mg via INTRAVENOUS

## 2023-08-07 MED ORDER — ORAL CARE MOUTH RINSE: 15.0000 mL | Freq: Once | OROMUCOSAL | Status: AC

## 2023-08-07 SURGICAL SUPPLY — 35 items
ADH SKN CLS APL DERMABOND .7 (GAUZE/BANDAGES/DRESSINGS) ×1
APL PRP STRL LF DISP 70% ISPRP (MISCELLANEOUS) ×1
ATTRACTOMAT 16X20 MAGNETIC DRP (DRAPES) ×1 IMPLANT
BAG COUNTER SPONGE SURGICOUNT (BAG) ×1 IMPLANT
BAG SPNG CNTER NS LX DISP (BAG) ×1
BLADE SURG 15 STRL LF DISP TIS (BLADE) ×1 IMPLANT
BLADE SURG 15 STRL SS (BLADE) ×1
CHLORAPREP W/TINT 26 (MISCELLANEOUS) ×1 IMPLANT
CLIP TI MEDIUM 6 (CLIP) ×2 IMPLANT
CLIP TI WIDE RED SMALL 6 (CLIP) ×2 IMPLANT
COVER SURGICAL LIGHT HANDLE (MISCELLANEOUS) ×1 IMPLANT
DERMABOND ADVANCED .7 DNX12 (GAUZE/BANDAGES/DRESSINGS) ×1 IMPLANT
DRAPE LAPAROTOMY T 98X78 PEDS (DRAPES) ×1 IMPLANT
DRAPE UTILITY XL STRL (DRAPES) ×1 IMPLANT
ELECT REM PT RETURN 15FT ADLT (MISCELLANEOUS) ×1 IMPLANT
GAUZE 4X4 16PLY ~~LOC~~+RFID DBL (SPONGE) ×1 IMPLANT
GLOVE SURG ORTHO 8.0 STRL STRW (GLOVE) ×1 IMPLANT
GOWN STRL REUS W/ TWL XL LVL3 (GOWN DISPOSABLE) ×3 IMPLANT
GOWN STRL REUS W/TWL XL LVL3 (GOWN DISPOSABLE) ×3
HEMOSTAT SURGICEL 2X4 FIBR (HEMOSTASIS) ×1 IMPLANT
ILLUMINATOR WAVEGUIDE N/F (MISCELLANEOUS) IMPLANT
KIT BASIN OR (CUSTOM PROCEDURE TRAY) ×1 IMPLANT
KIT TURNOVER KIT A (KITS) IMPLANT
NDL HYPO 22X1.5 SAFETY MO (MISCELLANEOUS) ×1 IMPLANT
NEEDLE HYPO 22X1.5 SAFETY MO (MISCELLANEOUS) ×1
PACK BASIC VI WITH GOWN DISP (CUSTOM PROCEDURE TRAY) ×1 IMPLANT
PENCIL SMOKE EVACUATOR (MISCELLANEOUS) ×1 IMPLANT
SHEARS HARMONIC 9CM CVD (BLADE) IMPLANT
SUT MNCRL AB 4-0 PS2 18 (SUTURE) ×1 IMPLANT
SUT VIC AB 3-0 SH 18 (SUTURE) ×1 IMPLANT
SYR BULB IRRIG 60ML STRL (SYRINGE) ×1 IMPLANT
SYR CONTROL 10ML LL (SYRINGE) ×1 IMPLANT
TOWEL OR 17X26 10 PK STRL BLUE (TOWEL DISPOSABLE) ×1 IMPLANT
TOWEL OR NON WOVEN STRL DISP B (DISPOSABLE) ×1 IMPLANT
TUBING CONNECTING 10 (TUBING) ×1 IMPLANT

## 2023-08-07 NOTE — Plan of Care (Signed)
[facility] Tonsillectomy/Adenoidectomy, Postoperative PEDS care plan entered in error.

## 2023-08-07 NOTE — Discharge Instructions (Addendum)

## 2023-08-07 NOTE — Interval H&P Note (Signed)
History and Physical Interval Note:  08/07/2023 8:37 AM  Sabrina [name]  has presented today for surgery, with the diagnosis of PRIMARY HYPERPARATHYROIDISM.  The various methods of treatment have been discussed with the patient and family. After consideration of risks, benefits and other options for treatment, the patient has consented to    Procedure(s): LEFT INFERIOR PARATHYROIDECTOMY (Left) as a surgical intervention.    The patient's history has been reviewed, patient examined, no change in status, stable for surgery.  I have reviewed the patient's chart and labs.  Questions were answered to the patient's satisfaction.    Darnell Level, MD [facility] practice Office: [phone]   Darnell Level

## 2023-08-07 NOTE — Op Note (Signed)
OPERATIVE REPORT - PARATHYROIDECTOMY  Preoperative diagnosis: Primary hyperparathyroidism  Postop diagnosis: Same  Procedure: Left inferior minimally invasive parathyroidectomy  Surgeon:  Darnell Level, MD  Anesthesia: General endotracheal  Estimated blood loss: Minimal  Preparation: ChloraPrep  Indications: Patient is furred by Dr. Purcell Nails for surgical evaluation and management of newly diagnosed primary hyperparathyroidism. Patient had been noted to have elevated serum calcium levels. Patient had an episode where she became more symptomatic with fatigue and leg swelling. She noted confusion. Laboratory studies showed an elevated calcium as high as 12.8. She was admitted to the hospital and treated with IV Zometa. Patient has had calcium levels ranging from 10.2-11.8. Intact PTH level has ranged from 87-100. 24-hour urine collection for calcium was performed and was elevated at 438.  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  Patient now comes to surgery for parathyroidectomy.  Procedure: The patient was prepared in the pre-operative holding area. The patient was brought to the operating room and placed in a supine position on the operating room table. Following administration of general anesthesia, the patient was positioned and then prepped and draped in the usual strict aseptic fashion. After ascertaining that an adequate level of anesthesia been achieved, a neck incision was made with a #15 blade. Dissection was carried through subcutaneous tissues and platysma. Hemostasis was obtained with the electrocautery. Skin flaps were developed circumferentially and a Weitlander retractor was placed for exposure.  Strap muscles were incised in the midline. Strap muscles were reflected laterally exposing the thyroid lobe. With gentle blunt dissection the thyroid lobe was mobilized.  Dissection was carried posteriorly and an enlarged parathyroid gland was  identified. It was gently mobilized. Vascular structures were divided between small ligaclips. Care was taken to avoid the recurrent laryngeal nerve. The parathyroid gland was completely excised. It was submitted to pathology where frozen section confirmed hypercellular parathyroid tissue consistent with adenoma.  Neck was irrigated with warm saline and good hemostasis was noted. Fibrillar was placed in the operative field. Strap muscles were approximated in the midline with interrupted 3-0 Vicryl sutures. Platysma was closed with interrupted 3-0 Vicryl sutures. Marcaine was infiltrated circumferentially. Skin was closed with a running 4-0 Monocryl subcuticular suture. Wound was washed and dried and Dermabond was applied. Patient was awakened from anesthesia and brought to the recovery room. The patient tolerated the procedure well.   Darnell Level, MD [facility] Surgery Office: [phone]

## 2023-08-07 NOTE — Anesthesia Preprocedure Evaluation (Addendum)
Anesthesia Evaluation  Patient identified by MRN, date of birth, ID band Patient awake    Reviewed: Allergy & Precautions, H&P , NPO status , Patient's Chart, lab work & pertinent test results  Airway Mallampati: II  TM Distance: >3 FB Neck ROM: Full    Dental no notable dental hx. (+) Teeth Intact, Dental Advisory Given   Pulmonary neg pulmonary ROS   Pulmonary exam normal breath sounds clear to auscultation       Cardiovascular hypertension, Pt. on medications  Rhythm:Regular Rate:Normal     Neuro/Psych negative neurological ROS  negative psych ROS   GI/Hepatic Neg liver ROS,GERD  Medicated,,  Endo/Other    Morbid obesity  Renal/GU negative Renal ROS  negative genitourinary   Musculoskeletal   Abdominal   Peds  Hematology negative hematology ROS (+)   Anesthesia Other Findings   Reproductive/Obstetrics negative OB ROS                             Anesthesia Physical Anesthesia Plan  ASA: 3  Anesthesia Plan: General   Post-op Pain Management: Tylenol PO (pre-op)*   Induction: Intravenous  PONV Risk Score and Plan: 4 or greater and Ondansetron, Dexamethasone and Midazolam  Airway Management Planned: Oral ETT  Additional Equipment:   Intra-op Plan:   Post-operative Plan: Extubation in OR  Informed Consent: I have reviewed the patients History and Physical, chart, labs and discussed the procedure including the risks, benefits and alternatives for the proposed anesthesia with the patient or authorized representative who has indicated his/her understanding and acceptance.     Dental advisory given  Plan Discussed with: CRNA  Anesthesia Plan Comments:        Anesthesia Quick Evaluation

## 2023-08-07 NOTE — Transfer of Care (Signed)
Immediate Anesthesia Transfer of Care Note  Patient: Sabrina [name]  Procedure(s) Performed: LEFT INFERIOR PARATHYROIDECTOMY (Left)  Patient Location: PACU  Anesthesia Type:General  Level of Consciousness: awake, alert , and patient cooperative  Airway & Oxygen Therapy: Patient Spontanous Breathing and Patient connected to face mask oxygen  Post-op Assessment: Report given to RN and Post -op Vital signs reviewed and stable  Post vital signs: Reviewed and stable  Last Vitals:  Vitals Value Taken Time  BP 118/77 08/07/23 1030  Temp    Pulse 68 08/07/23 1031  Resp 13 08/07/23 1031  SpO2 100 % 08/07/23 1031  Vitals shown include unfiled device data.  Last Pain:  Vitals:   08/07/23 0710  TempSrc: Oral  PainSc: 0-No pain         Complications: No notable events documented.

## 2023-08-07 NOTE — Anesthesia Postprocedure Evaluation (Signed)
Anesthesia Post Note  Patient: Sabrina [name]  Procedure(s) Performed: LEFT INFERIOR PARATHYROIDECTOMY (Left)     Patient location during evaluation: PACU Anesthesia Type: General Level of consciousness: awake and alert Pain management: pain level controlled Vital Signs Assessment: post-procedure vital signs reviewed and stable Respiratory status: spontaneous breathing, nonlabored ventilation and respiratory function stable Cardiovascular status: blood pressure returned to baseline and stable Postop Assessment: no apparent nausea or vomiting Anesthetic complications: no  No notable events documented.  Last Vitals:  Vitals:   08/07/23 1145 08/07/23 1200  BP:  127/78  Pulse: 65   Resp: 15   Temp:    SpO2: 98%     Last Pain:  Vitals:   08/07/23 1200  TempSrc:   PainSc: 2                  Shayana Hornstein,W. EDMOND

## 2023-08-07 NOTE — Anesthesia Procedure Notes (Signed)
Procedure Name: Intubation Date/Time: 08/07/2023 9:29 AM  Performed by: Sindy Guadeloupe, CRNAPre-anesthesia Checklist: Patient identified, Emergency Drugs available, Suction available, Patient being monitored and Timeout performed Patient Re-evaluated:Patient Re-evaluated prior to induction Oxygen Delivery Method: Circle system utilized Preoxygenation: Pre-oxygenation with 100% oxygen Induction Type: IV induction Ventilation: Mask ventilation without difficulty Laryngoscope Size: Mac and 4 Grade View: Grade II Tube type: Oral Tube size: 7.0 mm Number of attempts: 1 Airway Equipment and Method: Stylet Placement Confirmation: ETT inserted through vocal cords under direct vision, positive ETCO2 and breath sounds checked- equal and bilateral Secured at: 21 cm Tube secured with: Tape Dental Injury: Teeth and Oropharynx as per pre-operative assessment

## 2023-08-08 ENCOUNTER — Encounter (HOSPITAL_COMMUNITY): Payer: Self-pay | Admitting: Surgery

## 2023-08-08 LAB — SURGICAL PATHOLOGY

## 2023-08-14 ENCOUNTER — Telehealth: Payer: Self-pay | Admitting: Family Medicine

## 2023-08-14 MED ORDER — PANTOPRAZOLE SODIUM 40 MG PO TBEC: 40.0000 mg | DELAYED_RELEASE_TABLET | Freq: Two times a day (BID) | ORAL | 1 refills | Status: DC

## 2023-08-14 NOTE — Telephone Encounter (Signed)
Protonix Prescription sent to pharmacy

## 2023-08-14 NOTE — Telephone Encounter (Signed)
Patient aware and verbalized understanding. °

## 2023-08-14 NOTE — Telephone Encounter (Signed)
Copied from CRM [id number]. Topic: Clinical - Medication Question >> Aug 14, 2023 10:40 AM Georgeanna Harrison H wrote: Reason for CRM: Pt had a prescription for Protonix from the hospital and was wondering if her PCP could call in a refill.

## 2023-08-24 ENCOUNTER — Ambulatory Visit: Payer: Self-pay | Admitting: Family

## 2023-08-24 NOTE — Telephone Encounter (Signed)
Copied from CRM [id number]. Topic: Clinical - Red Word Triage >> Aug 24, 2023  2:11 PM Conni Elliot wrote: Red Word that prompted transfer to Nurse Triage: high blood pressure 180/140    Chief Complaint: Elevated blood pressure Symptoms: sligh headache, dry mouth Frequency: 3 days Pertinent Negatives: Patient denies shortness of breath, vision changes Disposition: [] ED /[] Urgent Care (no appt availability in office) / [x] Appointment(In office/virtual)/ []  [facility]/ [] Home Care/ [] Refused Recommended Disposition /[] [facility]/ []  Follow-up with PCP Additional Notes:  Amlodipine 5 mg was discontinued in August 2024 but the patient restarted it 3 days ago. Surgeon's office referred patient to pcp to follow up about elevated bp as he doesn't think high bp and surgery are related. Appointment scheduled 08/25/2023.   Reason for Disposition  Systolic BP  >= 180 OR Diastolic >= 110  Answer Assessment - Initial Assessment Questions 1. BLOOD PRESSURE: "What is the blood pressure?" "Did you take at least two measurements 5 minutes apart?"     148/90 08/23/23 BP was 180/140 at 1030 08/24/23; Reported elevated bp to surgeon's office.  Surgeon's  office called back around 1:40 pm told her to call pcp for follow up     Took Amlodipine 5 mg around 1045 and bp was 150/100  2. ONSET: "When did you take your blood pressure?"     3 days ago restarted bp med but bp has been elevated since Oct 22nd before surgery  3. HOW: "How did you take your blood pressure?" (e.g., automatic home BP monitor, visiting nurse)     Manual cuff - family member took it 4. HISTORY: "Do you have a history of high blood pressure?"     yes 5. MEDICINES: "Are you taking any medicines for blood pressure?" "Have you missed any doses recently?"  6. OTHER SYMPTOMS: "Do you have any symptoms?" (e.g., blurred vision, chest pain, difficulty breathing, headache, weakness)     Last few days I've been feeling off; dry  mouth, slight/faint headache; yesterday morning when I got up I was dizzy (only happened once since surgery) Around 1230 08/24/23 above left ear it was pounding for 30 minutes and I put pressure on it and stopped  Protocols used: Blood Pressure - High-A-AH

## 2023-08-25 ENCOUNTER — Encounter: Payer: Self-pay | Admitting: Family Medicine

## 2023-08-25 ENCOUNTER — Ambulatory Visit (INDEPENDENT_AMBULATORY_CARE_PROVIDER_SITE_OTHER): Payer: 59 | Admitting: Family Medicine

## 2023-08-25 VITALS — BP 126/82 | HR 81 | Ht 64.0 in

## 2023-08-25 DIAGNOSIS — I10 Essential (primary) hypertension: Secondary | ICD-10-CM

## 2023-08-25 DIAGNOSIS — I808 Phlebitis and thrombophlebitis of other sites: Secondary | ICD-10-CM | POA: Diagnosis not present

## 2023-08-25 MED ORDER — AMLODIPINE BESYLATE 5 MG PO TABS: 5.0000 mg | ORAL_TABLET | Freq: Every day | ORAL | 1 refills | Status: DC

## 2023-08-25 NOTE — Progress Notes (Addendum)
BP (!) 144/96   Pulse 81   Ht 5\' 4"  (1.626 m)   SpO2 97%   BMI 36.56 kg/m    Subjective:   Patient ID: Sabrina [name], female    DOB: [birth year], 52 y.o.   MRN: [id number]  HPI: Sabrina [name] is a 52 y.o. female presenting on 08/25/2023 for Hypertension (APH d/c Amlodipine. Pt not sure why) and Neck Pain (Right.-Recent parathyroid surgery on Nov. 4th)   HPI Hypertension Patient has been having high and low blood pressures.  They have been running 148/90 and 180/140 over the past few days and 140/100.  She used to take amlodipine but then they stopped it after hospital visit a couple months ago and she has been taking it sporadically when it has been high but not every day.  She did not take it today but she did take it yesterday and it is 126/82 here in the office today.  She does have occasional headache with it.  Patient has noted a hard firm what feels like a vein in her anterior neck near the midline, slightly to the right of midline.  She says it has been sore and is just come over the past week or 2.  She did have a parathyroid ectomy on the left lower earlier this month.  Relevant past medical, surgical, family and social history reviewed and updated as indicated. Interim medical history since our last visit reviewed. Allergies and medications reviewed and updated.  Review of Systems  Constitutional:  Negative for chills and fever.  HENT:  Negative for congestion, ear discharge and ear pain.   Eyes:  Negative for redness and visual disturbance.  Respiratory:  Negative for chest tightness and shortness of breath.   Cardiovascular:  Negative for chest pain and leg swelling.  Genitourinary:  Negative for difficulty urinating and dysuria.  Musculoskeletal:  Negative for back pain and gait problem.  Skin:  Negative for color change, rash and wound.  Neurological:  Positive for headaches. Negative for light-headedness.  Psychiatric/Behavioral:  Negative for agitation and  behavioral problems.   All other systems reviewed and are negative.   Per HPI unless specifically indicated above   Allergies as of 08/25/2023   No Known Allergies      Medication List        Accurate as of August 25, 2023  8:47 AM. If you have any questions, ask your nurse or doctor.          amLODipine 5 MG tablet Commonly known as: NORVASC Take 1 tablet (5 mg total) by mouth daily. Started by: Elige Radon Vivianne Carles   aspirin EC 81 MG tablet Take 81 mg by mouth daily. Swallow whole.   diclofenac 75 MG EC tablet Commonly known as: VOLTAREN Take 1 tablet (75 mg total) by mouth 2 (two) times daily.   folic acid 800 MCG tablet Commonly known as: FOLVITE Take 400 mcg by mouth daily. What changed: Another medication with the same name was removed. Continue taking this medication, and follow the directions you see here. Changed by: Elige Radon Miosha Behe   ondansetron 8 MG disintegrating tablet Commonly known as: ZOFRAN-ODT Take 1 tablet (8 mg total) by mouth 3 (three) times daily before meals.   pantoprazole 40 MG tablet Commonly known as: PROTONIX Take 1 tablet (40 mg total) by mouth 2 (two) times daily.   SUMAtriptan 25 MG tablet Commonly known as: IMITREX TAKE 1 TABLET (25 MG) TOTAL BY MOUTH ONCE FOR 1 DOSE. MAY REPEAT IN  2 HOURS IF HEADACHE PERSISTS OR RECURS   traMADol 50 MG tablet Commonly known as: ULTRAM Take 1 tablet (50 mg total) by mouth every 6 (six) hours as needed for moderate pain (pain score 4-6).   traZODone 50 MG tablet Commonly known as: DESYREL TAKE 1 TABLET BY MOUTH AT BEDTIME   Vitamin D 50 MCG (2000 UT) Caps Take 2,000 Units by mouth daily with breakfast.         Objective:   BP (!) 144/96   Pulse 81   Ht 5\' 4"  (1.626 m)   SpO2 97%   BMI 36.56 kg/m   Wt Readings from Last 3 Encounters:  07/25/23 213 lb (96.6 kg)  05/16/23 214 lb (97.1 kg)  05/12/23 210 lb (95.3 kg)    Physical Exam Vitals and nursing note reviewed.   Constitutional:      General: She is not in acute distress.    Appearance: She is well-developed. She is not diaphoretic.  Eyes:     Conjunctiva/sclera: Conjunctivae normal.     Pupils: Pupils are equal, round, and reactive to light.  Neck:   Cardiovascular:     Rate and Rhythm: Normal rate and regular rhythm.     Heart sounds: Normal heart sounds. No murmur heard. Pulmonary:     Effort: Pulmonary effort is normal. No respiratory distress.     Breath sounds: Normal breath sounds. No wheezing.  Musculoskeletal:        General: No tenderness. Normal range of motion.  Skin:    General: Skin is warm and dry.     Findings: No rash.  Neurological:     Mental Status: She is alert and oriented to person, place, and time.     Coordination: Coordination normal.  Psychiatric:        Behavior: Behavior normal.       Assessment & Plan:   Problem List Items Addressed This Visit       Cardiovascular and Mediastinum   Essential hypertension, benign - Primary   Relevant Medications   aspirin EC 81 MG tablet   amLODipine (NORVASC) 5 MG tablet   Other Visit Diagnoses     Superficial thrombophlebitis involving other site       Relevant Medications   aspirin EC 81 MG tablet   amLODipine (NORVASC) 5 MG tablet   Other Relevant Orders   US Carotid Duplex Bilateral     Will do ultrasound of the vasculature of the neck but likely superficial thrombophlebitis and recommended anti-inflammatories.  Restart amlodipine and follow-up with PCP in a month  Follow up plan: Return in about 4 weeks (around 09/22/2023), or if symptoms worsen or fail to improve, for Blood pressure recheck.  Counseling provided for all of the vaccine components Orders Placed This Encounter  Procedures   US Carotid Duplex Bilateral    Arville Care, MD [facility] 08/25/2023, 8:47 AM

## 2023-08-29 ENCOUNTER — Telehealth: Payer: Self-pay | Admitting: Family

## 2023-08-29 NOTE — Telephone Encounter (Signed)
Copied from CRM [id number]. Topic: General - Other >> Aug 29, 2023  2:14 PM Dimitri Ped wrote: Reason for CRM: Patient was in office on Friday and the doctor said he would schedule a ultrasound for patient neck. Patient is calling to see has that been done or what's the process with scheduling this appointment. Call back number [phone]

## 2023-08-29 NOTE — Telephone Encounter (Signed)
Pt aware Ultrasound was ordered and that we are just waiting for scheduling to get it approved and scheduled.

## 2023-09-06 ENCOUNTER — Telehealth: Payer: Self-pay | Admitting: Family

## 2023-09-06 NOTE — Telephone Encounter (Signed)
 Copied from CRM [id number]. Topic: General - Other >> Aug 29, 2023  2:14 PM Dimitri Ped wrote: Reason for CRM: Patient was in office on Friday and the doctor said he would schedule a ultrasound for patient neck. Patient is calling to see has that been done or what's the process with scheduling this appointment. Call back number [phone]

## 2023-09-06 NOTE — Telephone Encounter (Signed)
Spoke with Patient - Patient is aware of 09/08/2023 Appt :)

## 2023-09-07 DIAGNOSIS — Z9089 Acquired absence of other organs: Secondary | ICD-10-CM | POA: Insufficient documentation

## 2023-09-08 ENCOUNTER — Inpatient Hospital Stay (HOSPITAL_COMMUNITY): Admission: RE | Admit: 2023-09-08 | Payer: 59 | Source: Ambulatory Visit

## 2023-09-11 ENCOUNTER — Ambulatory Visit (HOSPITAL_COMMUNITY)
Admission: RE | Admit: 2023-09-11 | Discharge: 2023-09-11 | Disposition: A | Discharge status: Home / Self Care | Payer: 59 | Source: Ambulatory Visit | Attending: Family Medicine | Admitting: Family Medicine

## 2023-09-11 DIAGNOSIS — I808 Phlebitis and thrombophlebitis of other sites: Secondary | ICD-10-CM | POA: Diagnosis present

## 2023-09-21 ENCOUNTER — Ambulatory Visit: Payer: 59 | Admitting: Family

## 2023-09-29 ENCOUNTER — Other Ambulatory Visit: Payer: Self-pay | Admitting: Family

## 2023-09-29 DIAGNOSIS — M25562 Pain in left knee: Secondary | ICD-10-CM

## 2023-09-29 NOTE — Telephone Encounter (Signed)
Copied from CRM [id number]. Topic: Clinical - Medication Refill >> Sep 29, 2023  2:16 PM Sabrina [name] B wrote: Most Recent Primary Care Visit:  Provider: Arville Care A  Department: [facility]  Visit Type: ACUTE  Date: 08/25/2023  Medication: ***  Has the patient contacted their pharmacy?  (Agent: If no, request that the patient contact the pharmacy for the refill. If patient does not wish to contact the pharmacy document the reason why and proceed with request.) (Agent: If yes, when and what did the pharmacy advise?)  Is this the correct pharmacy for this prescription?  If no, delete pharmacy and type the correct one.  This is the patient's preferred pharmacy:  [facility] - [place], [place] - [address] [place] [address] Phone: [phone] Fax: [phone]   Has the prescription been filled recently? Yes  Is the patient out of the medication? No  Has the patient been seen for an appointment in the last year OR does the patient have an upcoming appointment? Yes  Can we respond through MyChart? No  Agent: Please be advised that Rx refills may take up to 3 business days. We ask that you follow-up with your pharmacy.

## 2023-10-02 MED ORDER — DICLOFENAC SODIUM 75 MG PO TBEC: 75.0000 mg | DELAYED_RELEASE_TABLET | Freq: Two times a day (BID) | ORAL | 2 refills | Status: DC

## 2023-10-12 ENCOUNTER — Encounter: Payer: Self-pay | Admitting: Family

## 2023-10-12 ENCOUNTER — Ambulatory Visit (INDEPENDENT_AMBULATORY_CARE_PROVIDER_SITE_OTHER): Payer: 59 | Admitting: Family

## 2023-10-12 DIAGNOSIS — I10 Essential (primary) hypertension: Secondary | ICD-10-CM | POA: Diagnosis not present

## 2023-10-12 MED ORDER — AMLODIPINE BESYLATE 5 MG PO TABS: 5.0000 mg | ORAL_TABLET | Freq: Every day | ORAL | 1 refills | Status: DC

## 2023-10-12 NOTE — Progress Notes (Signed)
 Subjective:    Patient ID: Sabrina [name], female    DOB: 10-06-1970, 53 y.o.   MRN: [id number]  Chief Complaint  Patient presents with   Hypertension   Pt presents to the office today for follow up on HTN. She stopped her amlodipine  5 mg and her BP was increased to 148/90. She restarted her Amlodipine  5 mg. Her BP is at goal today.  Hypertension This is a chronic problem. The current episode started more than 1 year ago. The problem has been resolved since onset. Associated symptoms include peripheral edema. Pertinent negatives include no malaise/fatigue or shortness of breath. Risk factors for coronary artery disease include obesity. Past treatments include calcium channel blockers. The current treatment provides moderate improvement.      Review of Systems  Constitutional:  Negative for malaise/fatigue.  Respiratory:  Negative for shortness of breath.   All other systems reviewed and are negative.   Social History   Socioeconomic History   Marital status: Married    Spouse name: Not on file   Number of children: Not on file   Years of education: Not on file   Highest education level: Not on file  Occupational History   Not on file  Tobacco Use   Smoking status: Never   Smokeless tobacco: Never  Vaping Use   Vaping status: Never Used  Substance and Sexual Activity   Alcohol use: No   Drug use: No   Sexual activity: Yes    Birth control/protection: Surgical    Comment: tubal  Other Topics Concern   Not on file  Social History Narrative   Not on file   Social Drivers of Health   Financial Resource Strain: Medium Risk (11/15/2022)   Overall Financial Resource Strain (CARDIA)    Difficulty of Paying Living Expenses: Somewhat hard  Food Insecurity: No Food Insecurity (05/08/2023)   Hunger Vital Sign    Worried About Running Out of Food in the Last Year: Never true    Ran Out of Food in the Last Year: Never true  Transportation Needs: No Transportation Needs  (05/08/2023)   PRAPARE - Administrator, Civil Service (Medical): No    Lack of Transportation (Non-Medical): No  Physical Activity: Insufficiently Active (11/15/2022)   Exercise Vital Sign    Days of Exercise per Week: 1 day    Minutes of Exercise per Session: 20 min  Stress: Stress Concern Present (11/15/2022)   [employer] of Occupational Health - Occupational Stress Questionnaire    Feeling of Stress : Rather much  Social Connections: Socially Integrated (11/15/2022)   Social Connection and Isolation Panel [NHANES]    Frequency of Communication with Friends and Family: More than three times a week    Frequency of Social Gatherings with Friends and Family: Once a week    Attends Religious Services: More than 4 times per year    Active Member of [employer] or Organizations: Yes    Attends Engineer, Structural: More than 4 times per year    Marital Status: Married   Family History  Problem Relation Age of Onset   Diabetes Mother    Hypertension Mother    Heart attack Father    Pulmonary embolism Sister    Hypertension Other    Diabetes Other    Breast cancer Neg Hx         Objective:   Physical Exam Vitals reviewed.  Constitutional:      General: She is not in acute  distress.    Appearance: She is well-developed.  HENT:     Head: Normocephalic and atraumatic.     Right Ear: Tympanic membrane normal.     Left Ear: Tympanic membrane normal.  Eyes:     Pupils: Pupils are equal, round, and reactive to light.  Neck:     Thyroid : No thyromegaly.  Cardiovascular:     Rate and Rhythm: Normal rate and regular rhythm.     Heart sounds: Normal heart sounds. No murmur heard. Pulmonary:     Effort: Pulmonary effort is normal. No respiratory distress.     Breath sounds: Normal breath sounds. No wheezing.  Abdominal:     General: Bowel sounds are normal. There is no distension.     Palpations: Abdomen is soft.     Tenderness: There is no abdominal tenderness.   Musculoskeletal:        General: No tenderness. Normal range of motion.     Cervical back: Normal range of motion and neck supple.  Skin:    General: Skin is warm and dry.  Neurological:     Mental Status: She is alert and oriented to person, place, and time.     Cranial Nerves: No cranial nerve deficit.     Deep Tendon Reflexes: Reflexes are normal and symmetric.  Psychiatric:        Behavior: Behavior normal.        Thought Content: Thought content normal.        Judgment: Judgment normal.       BP 126/81   Pulse 82   Temp 98.1 F (36.7 C) (Temporal)   Ht 5' 4 (1.626 m)   Wt 221 lb 9.6 oz (100.5 kg)   SpO2 99%   BMI 38.04 kg/m      Assessment & Plan:  Sabrina [name] comes in today with chief complaint of Hypertension   Diagnosis and orders addressed:  1. Essential hypertension, benign -Daily blood pressure log given with instructions on how to fill out and told to bring to next visit -Dash diet information given -Exercise encouraged - Stress Management  -Continue current meds -RTO in 6 months  - amLODipine  (NORVASC ) 5 MG tablet; Take 1 tablet (5 mg total) by mouth daily.  Dispense: 90 tablet; Refill: 1 - BMP8+EGFR     Bari Learn, FNP

## 2023-10-12 NOTE — Patient Instructions (Signed)
 Hypertension, Adult High blood pressure (hypertension) is when the force of blood pumping through the arteries is too strong. The arteries are the blood vessels that carry blood from the heart throughout the body. Hypertension forces the heart to work harder to pump blood and may cause arteries to become narrow or stiff. Untreated or uncontrolled hypertension can lead to a heart attack, heart failure, a stroke, kidney disease, and other problems. A blood pressure reading consists of a higher number over a lower number. Ideally, your blood pressure should be below 120/80. The first ("top") number is called the systolic pressure. It is a measure of the pressure in your arteries as your heart beats. The second ("bottom") number is called the diastolic pressure. It is a measure of the pressure in your arteries as the heart relaxes. What are the causes? The exact cause of this condition is not known. There are some conditions that result in high blood pressure. What increases the risk? Certain factors may make you more likely to develop high blood pressure. Some of these risk factors are under your control, including: Smoking. Not getting enough exercise or physical activity. Being overweight. Having too much fat, sugar, calories, or salt (sodium) in your diet. Drinking too much alcohol. Other risk factors include: Having a personal history of heart disease, diabetes, high cholesterol, or kidney disease. Stress. Having a family history of high blood pressure and high cholesterol. Having obstructive sleep apnea. Age. The risk increases with age. What are the signs or symptoms? High blood pressure may not cause symptoms. Very high blood pressure (hypertensive crisis) may cause: Headache. Fast or irregular heartbeats (palpitations). Shortness of breath. Nosebleed. Nausea and vomiting. Vision changes. Severe chest pain, dizziness, and seizures. How is this diagnosed? This condition is diagnosed by  measuring your blood pressure while you are seated, with your arm resting on a flat surface, your legs uncrossed, and your feet flat on the floor. The cuff of the blood pressure monitor will be placed directly against the skin of your upper arm at the level of your heart. Blood pressure should be measured at least twice using the same arm. Certain conditions can cause a difference in blood pressure between your right and left arms. If you have a high blood pressure reading during one visit or you have normal blood pressure with other risk factors, you may be asked to: Return on a different day to have your blood pressure checked again. Monitor your blood pressure at home for 1 week or longer. If you are diagnosed with hypertension, you may have other blood or imaging tests to help your health care provider understand your overall risk for other conditions. How is this treated? This condition is treated by making healthy lifestyle changes, such as eating healthy foods, exercising more, and reducing your alcohol intake. You may be referred for counseling on a healthy diet and physical activity. Your health care provider may prescribe medicine if lifestyle changes are not enough to get your blood pressure under control and if: Your systolic blood pressure is above 130. Your diastolic blood pressure is above 80. Your personal target blood pressure may vary depending on your medical conditions, your age, and other factors. Follow these instructions at home: Eating and drinking  Eat a diet that is high in fiber and potassium, and low in sodium, added sugar, and fat. An example of this eating plan is called the DASH diet. DASH stands for Dietary Approaches to Stop Hypertension. To eat this way: Eat  plenty of fresh fruits and vegetables. Try to fill one half of your plate at each meal with fruits and vegetables. Eat whole grains, such as whole-wheat pasta, brown rice, or whole-grain bread. Fill about one  fourth of your plate with whole grains. Eat or drink low-fat dairy products, such as skim milk or low-fat yogurt. Avoid fatty cuts of meat, processed or cured meats, and poultry with skin. Fill about one fourth of your plate with lean proteins, such as fish, chicken without skin, beans, eggs, or tofu. Avoid pre-made and processed foods. These tend to be higher in sodium, added sugar, and fat. Reduce your daily sodium intake. Many people with hypertension should eat less than 1,500 mg of sodium a day. Do not drink alcohol if: Your health care provider tells you not to drink. You are pregnant, may be pregnant, or are planning to become pregnant. If you drink alcohol: Limit how much you have to: 0-1 drink a day for women. 0-2 drinks a day for men. Know how much alcohol is in your drink. In the [place] one drink equals one 12 oz bottle of beer (355 mL), one 5 oz glass of wine (148 mL), or one 1 oz glass of hard liquor (44 mL). Lifestyle  Work with your health care provider to maintain a healthy body weight or to lose weight. Ask what an ideal weight is for you. Get at least 30 minutes of exercise that causes your heart to beat faster (aerobic exercise) most days of the week. Activities may include walking, swimming, or biking. Include exercise to strengthen your muscles (resistance exercise), such as Pilates or lifting weights, as part of your weekly exercise routine. Try to do these types of exercises for 30 minutes at least 3 days a week. Do not use any products that contain nicotine or tobacco. These products include cigarettes, chewing tobacco, and vaping devices, such as e-cigarettes. If you need help quitting, ask your health care provider. Monitor your blood pressure at home as told by your health care provider. Keep all follow-up visits. This is important. Medicines Take over-the-counter and prescription medicines only as told by your health care provider. Follow directions carefully. Blood  pressure medicines must be taken as prescribed. Do not skip doses of blood pressure medicine. Doing this puts you at risk for problems and can make the medicine less effective. Ask your health care provider about side effects or reactions to medicines that you should watch for. Contact a health care provider if you: Think you are having a reaction to a medicine you are taking. Have headaches that keep coming back (recurring). Feel dizzy. Have swelling in your ankles. Have trouble with your vision. Get help right away if you: Develop a severe headache or confusion. Have unusual weakness or numbness. Feel faint. Have severe pain in your chest or abdomen. Vomit repeatedly. Have trouble breathing. These symptoms may be an emergency. Get help right away. Call 911. Do not wait to see if the symptoms will go away. Do not drive yourself to the hospital. Summary Hypertension is when the force of blood pumping through your arteries is too strong. If this condition is not controlled, it may put you at risk for serious complications. Your personal target blood pressure may vary depending on your medical conditions, your age, and other factors. For most people, a normal blood pressure is less than 120/80. Hypertension is treated with lifestyle changes, medicines, or a combination of both. Lifestyle changes include losing weight, eating a healthy,  low-sodium diet, exercising more, and limiting alcohol. This information is not intended to replace advice given to you by your health care provider. Make sure you discuss any questions you have with your health care provider. Document Revised: 07/27/2021 Document Reviewed: 07/27/2021 Elsevier Patient Education  2024 [employer].

## 2023-10-13 LAB — BMP8+EGFR
BUN/Creatinine Ratio: 12 (ref 9–23)
BUN: 11 mg/dL (ref 6–24)
CO2: 22 mmol/L (ref 20–29)
Calcium: 9.1 mg/dL (ref 8.7–10.2)
Chloride: 105 mmol/L (ref 96–106)
Creatinine, Ser: 0.93 mg/dL (ref 0.57–1.00)
Glucose: 86 mg/dL (ref 70–99)
Potassium: 4.2 mmol/L (ref 3.5–5.2)
Sodium: 138 mmol/L (ref 134–144)
eGFR: 75 mL/min/{1.73_m2} (ref >59)

## 2023-10-25 ENCOUNTER — Other Ambulatory Visit: Payer: Self-pay | Admitting: Adult Health

## 2023-10-27 ENCOUNTER — Other Ambulatory Visit: Payer: Self-pay | Admitting: Adult Health

## 2023-11-06 ENCOUNTER — Other Ambulatory Visit: Payer: Self-pay | Admitting: Family

## 2023-11-06 DIAGNOSIS — Z1231 Encounter for screening mammogram for malignant neoplasm of breast: Secondary | ICD-10-CM

## 2023-11-07 ENCOUNTER — Ambulatory Visit: Payer: Self-pay | Admitting: Family

## 2023-11-07 MED ORDER — TRAZODONE HCL 50 MG PO TABS: 50.0000 mg | ORAL_TABLET | Freq: Every day | ORAL | 3 refills | Status: AC

## 2023-11-07 NOTE — Telephone Encounter (Signed)
Aware Trazodone refill sent to pharmacy

## 2023-11-07 NOTE — Telephone Encounter (Signed)
 Prescription sent to pharmacy.

## 2023-11-07 NOTE — Telephone Encounter (Signed)
 Copied From CRM [id number]. Reason for Triage: Patient is out of the following medication: traMADol  (ULTRAM ) 50 MG tablet. She states she is having issues with sleeping/ anxiety and staying and she has been out of the medication for 2 weeks.    Chief Complaint: medication refill for Tramadol  50mg  Symptoms: restless, trouble sleeping, edgy  Additional Notes: Pt requesting refill of Tramadol  50mg  by PCP. This prescription was originally written by OB/GYN NP Delon who wont refill it at this time. Pt asking if PCP NP Bari can now refill and manage this prescription. Pt sts that she has been out of her Tramadol  for 2 weeks now and it is affecting her sleep and work schedule. She has tried taking Advil PM, but states that if she has to use the bathroom or hears a noise, she is up the whole night, plus, she doesn't want it to interfere with her other medications. Will route request to clinic. Pt advised of 3 day processing time.   Reason for Disposition  Caller requesting a CONTROLLED substance prescription refill (e.g., narcotics, ADHD medicines)  Answer Assessment - Initial Assessment Questions 1. DRUG NAME: What medicine do you need to have refilled?     Tramadol   2. REFILLS REMAINING: How many refills are remaining? (Note: The label on the medicine or pill bottle will show how many refills are remaining. If there are no refills remaining, then a renewal may be needed.)     No refills  3. EXPIRATION DATE: What is the expiration date? (Note: The label states when the prescription will expire, and thus can no longer be refilled.)     Unk  4. PRESCRIBING HCP: Who prescribed it? Reason: If prescribed by specialist, call should be referred to that group.    NP Delon Lewis, OBGYN   5. SYMPTOMS: Do you have any symptoms?     Edgy, not sleeping, feeling tired through the night  6. PREGNANCY: Is there any chance that you are pregnant? When was your last menstrual period?      no  Protocols used: Medication Refill and Renewal Call-A-AH

## 2023-11-07 NOTE — Telephone Encounter (Signed)
Called patient she does not want the tramadol she is wanting the trazodone please advise if you will refill for patient?

## 2023-11-13 ENCOUNTER — Encounter: Payer: Self-pay | Admitting: Family

## 2023-11-13 ENCOUNTER — Ambulatory Visit
Admission: RE | Admit: 2023-11-13 | Discharge: 2023-11-13 | Disposition: A | Discharge status: Home / Self Care | Payer: 59 | Source: Ambulatory Visit | Attending: Family | Admitting: Family

## 2023-11-13 ENCOUNTER — Ambulatory Visit (INDEPENDENT_AMBULATORY_CARE_PROVIDER_SITE_OTHER): Payer: 59 | Admitting: Family

## 2023-11-13 VITALS — BP 118/85 | HR 78 | Temp 98.5°F | Wt 214.4 lb

## 2023-11-13 DIAGNOSIS — K219 Gastro-esophageal reflux disease without esophagitis: Secondary | ICD-10-CM | POA: Diagnosis not present

## 2023-11-13 DIAGNOSIS — Z0001 Encounter for general adult medical examination with abnormal findings: Secondary | ICD-10-CM

## 2023-11-13 DIAGNOSIS — I10 Essential (primary) hypertension: Secondary | ICD-10-CM

## 2023-11-13 DIAGNOSIS — M7989 Other specified soft tissue disorders: Secondary | ICD-10-CM

## 2023-11-13 DIAGNOSIS — Z1231 Encounter for screening mammogram for malignant neoplasm of breast: Secondary | ICD-10-CM

## 2023-11-13 DIAGNOSIS — Z0000 Encounter for general adult medical examination without abnormal findings: Secondary | ICD-10-CM

## 2023-11-13 DIAGNOSIS — E210 Primary hyperparathyroidism: Secondary | ICD-10-CM

## 2023-11-13 DIAGNOSIS — L80 Vitiligo: Secondary | ICD-10-CM | POA: Insufficient documentation

## 2023-11-13 DIAGNOSIS — E559 Vitamin D deficiency, unspecified: Secondary | ICD-10-CM

## 2023-11-13 LAB — LIPID PANEL

## 2023-11-13 MED ORDER — PANTOPRAZOLE SODIUM 40 MG PO TBEC: 40.0000 mg | DELAYED_RELEASE_TABLET | Freq: Two times a day (BID) | ORAL | 4 refills | Status: AC

## 2023-11-13 NOTE — Progress Notes (Signed)
 Subjective:    Patient ID: Sabrina [name], female    DOB: 02/27/1971, 53 y.o.   MRN: [id number]  Chief Complaint  Patient presents with   Annual Exam    Pt would like a referral to dermatology. Pt has a spot on chest she would liked to have looked at.   Pt presents to the office today for CPE without pap. She is followed by GYN and  had her pap 08/16/21.  She is followed by Endocrinologists for hyperparathyroidism.   She is morbid obese with a BMI of 36 with HTN and Hyperlipidemia.   She has a cyst in the middle of her chest that is getting larger. Requesting referral to dermatologists.  Hypertension This is a chronic problem. The current episode started more than 1 year ago. The problem has been resolved since onset. The problem is uncontrolled. Pertinent negatives include no malaise/fatigue, peripheral edema or shortness of breath. Risk factors for coronary artery disease include obesity. The current treatment provides moderate improvement.  Gastroesophageal Reflux She complains of belching and heartburn. This is a chronic problem. The current episode started more than 1 year ago. The problem occurs occasionally. Risk factors include obesity. She has tried a PPI for the symptoms. The treatment provided moderate relief.  Hyperlipidemia This is a chronic problem. The current episode started more than 1 year ago. The problem is uncontrolled. Recent lipid tests were reviewed and are high. Exacerbating diseases include obesity. Pertinent negatives include no shortness of breath. Current antihyperlipidemic treatment includes diet change. The current treatment provides no improvement of lipids. Risk factors for coronary artery disease include dyslipidemia and a sedentary lifestyle.      Review of Systems  Constitutional:  Negative for malaise/fatigue.  Respiratory:  Negative for shortness of breath.   Gastrointestinal:  Positive for heartburn.  All other systems reviewed and are  negative.  Family History  Problem Relation Age of Onset   Diabetes Mother    Hypertension Mother    Heart attack Father    Pulmonary embolism Sister    Hypertension Other    Diabetes Other    Breast cancer Neg Hx    Social History   Socioeconomic History   Marital status: Married    Spouse name: Not on file   Number of children: Not on file   Years of education: Not on file   Highest education level: Not on file  Occupational History   Not on file  Tobacco Use   Smoking status: Never   Smokeless tobacco: Never  Vaping Use   Vaping status: Never Used  Substance and Sexual Activity   Alcohol use: No   Drug use: No   Sexual activity: Yes    Birth control/protection: Surgical    Comment: tubal  Other Topics Concern   Not on file  Social History Narrative   Not on file   Social Drivers of Health   Financial Resource Strain: Medium Risk (11/15/2022)   Overall Financial Resource Strain (CARDIA)    Difficulty of Paying Living Expenses: Somewhat hard  Food Insecurity: No Food Insecurity (05/08/2023)   Hunger Vital Sign    Worried About Running Out of Food in the Last Year: Never true    Ran Out of Food in the Last Year: Never true  Transportation Needs: No Transportation Needs (05/08/2023)   PRAPARE - Administrator, Civil Service (Medical): No    Lack of Transportation (Non-Medical): No  Physical Activity: Insufficiently Active (11/15/2022)   Exercise  Vital Sign    Days of Exercise per Week: 1 day    Minutes of Exercise per Session: 20 min  Stress: Stress Concern Present (11/15/2022)   [employer] of Occupational Health - Occupational Stress Questionnaire    Feeling of Stress : Rather much  Social Connections: Socially Integrated (11/15/2022)   Social Connection and Isolation Panel [NHANES]    Frequency of Communication with Friends and Family: More than three times a week    Frequency of Social Gatherings with Friends and Family: Once a week    Attends  Religious Services: More than 4 times per year    Active Member of [employer] or Organizations: Yes    Attends Engineer, structural: More than 4 times per year    Marital Status: Married        Objective:   Physical Exam Vitals reviewed.  Constitutional:      General: She is not in acute distress.    Appearance: She is well-developed. She is obese.  HENT:     Head: Normocephalic and atraumatic.     Right Ear: Tympanic membrane normal.     Left Ear: Tympanic membrane normal.  Eyes:     Pupils: Pupils are equal, round, and reactive to light.  Neck:     Thyroid : No thyromegaly.  Cardiovascular:     Rate and Rhythm: Normal rate and regular rhythm.     Heart sounds: Normal heart sounds. No murmur heard. Pulmonary:     Effort: Pulmonary effort is normal. No respiratory distress.     Breath sounds: Normal breath sounds. No wheezing.  Abdominal:     General: Bowel sounds are normal. There is no distension.     Palpations: Abdomen is soft.     Tenderness: There is no abdominal tenderness.  Musculoskeletal:        General: No tenderness. Normal range of motion.     Cervical back: Normal range of motion and neck supple.  Skin:    General: Skin is warm and dry.          Comments: Discoloration of bilateral lips, cyst with black head, no erythemas, tenderness   Neurological:     Mental Status: She is alert and oriented to person, place, and time.     Cranial Nerves: No cranial nerve deficit.     Deep Tendon Reflexes: Reflexes are normal and symmetric.  Psychiatric:        Behavior: Behavior normal.        Thought Content: Thought content normal.        Judgment: Judgment normal.       BP 118/85   Pulse 78   Temp 98.5 F (36.9 C)   Wt 214 lb 6.4 oz (97.3 kg)   SpO2 97%   BMI 36.80 kg/m      Assessment & Plan:  Sabrina [name] comes in today with chief complaint of Annual Exam (Pt would like a referral to dermatology. Pt has a spot on chest she would liked to  have looked at.)   Diagnosis and orders addressed:  1. Annual physical exam (Primary) - CMP14+EGFR - CBC with Differential/Platelet - Lipid panel - TSH - VITAMIN D  25 Hydroxy (Vit-D Deficiency, Fractures)  2. Essential hypertension, benign - CMP14+EGFR  3. Gastroesophageal reflux disease, unspecified whether esophagitis present - CMP14+EGFR - pantoprazole  (PROTONIX ) 40 MG tablet; Take 1 tablet (40 mg total) by mouth 2 (two) times daily.  Dispense: 180 tablet; Refill: 4  4. Hyperparathyroidism, primary (HCC) -  CMP14+EGFR - TSH  5. Morbid obesity (HCC) - CMP14+EGFR  6. Vitamin D  deficiency - CMP14+EGFR - VITAMIN D  25 Hydroxy (Vit-D Deficiency, Fractures)  7. Cyst of soft tissue - Ambulatory referral to Dermatology - CMP14+EGFR  8. Vitiligo - Ambulatory referral to Dermatology - CMP14+EGFR  Labs pending Continue current medications  Keep specialists  Health Maintenance reviewed Diet and exercise encouraged  Follow up plan: 1 year    Tommas Fragmin, FNP

## 2023-11-13 NOTE — Patient Instructions (Signed)
 Patches of Discolored Skin (Vitiligo): What to Know Vitiligo is a long-term (chronic) skin disease that causes patches of discolored skin. These patches turn milky white because the skin loses its natural color. Some people may also lose color in their hair, eyes, or inside their mouth. There are two main types of vitiligo: Nonsegmental or bilateral vitiligo. This type affects both sides of the body, often on the face, hands, arms, knees, or feet. It can come and go. Segmental or unilateral vitiligo. This type affects one side of the body, often on the face, arm, or leg. Some people also lose hair color. It can get bad for a while and then stop. Vitiligo can affect anyone, but it's more noticeable in people with darker skin. It can't be passed from person to person. But, it can cause emotional stress. What are the causes? The cause isn't known. It may involve: Your body's defense system, or immune system, attacking your skin cells that produce color. This is an autoimmune disease. Your genetics. Vitiligo can run in families. What increases the risk? Having a family history of vitiligo. Having another autoimmune disease, such as rheumatoid arthritis, autoimmune thyroid disease, or type 1 diabetes. What are the signs or symptoms?  The main symptom of vitiligo is loss of color in the skin, hair, eyes, and inside the mouth. Some people with vitiligo may have a higher risk of losing their hearing or eyesight. How is this diagnosed? Your provider may do a physical exam of your skin to look for discolored patches. You may also need blood tests to check for other autoimmune diseases. How is this treated? There's no cure, but treatments can help. These can include: Makeup or self-tanning lotions to add color to areas that have lost color. Hair dye to add color to white patches of hair. Skin creams, such as steroid or vitamin D creams, to bring back some color. Ultraviolet light treatment. Medicines to  lighten darker skin to make white patches less visible. Skin grafting surgery to transfer darker skin to patchy white areas. Surgical tattooing to add color to white patches. Follow these instructions at home: Take your medicines only as told. Protect your skin from the sun. Sunburn can make vitiligo worse. Use sunscreen with an SPF of 30 or higher. Cover affected skin with clothing or wear a hat when you're outside. Manage stress. Stress can make vitiligo worse. Get counseling and support if vitiligo affects your emotions. Keep all follow-up visits to watch your condition. Contact a health care provider if: Your condition is getting worse. You're struggling with depression or anxiety. You notice changes in hearing or vision. This information is not intended to replace advice given to you by your health care provider. Make sure you discuss any questions you have with your health care provider. Document Revised: 05/19/2023 Document Reviewed: 05/19/2023 Elsevier Patient Education  2024 [employer].

## 2023-11-14 LAB — CMP14+EGFR
ALT: 57 IU/L — ABNORMAL HIGH (ref 0–32)
AST: 30 IU/L (ref 0–40)
Albumin: 4.1 g/dL (ref 3.9–4.9)
Alkaline Phosphatase: 57 IU/L (ref 44–121)
BUN/Creatinine Ratio: 11 (ref 9–23)
BUN: 11 mg/dL (ref 6–24)
Bilirubin Total: 0.4 mg/dL (ref 0.0–1.2)
CO2: 21 mmol/L (ref 20–29)
Calcium: 9.1 mg/dL (ref 8.7–10.2)
Chloride: 106 mmol/L (ref 96–106)
Creatinine, Ser: 0.97 mg/dL (ref 0.57–1.00)
Globulin, Total: 2.2 g/dL (ref 1.5–4.5)
Glucose: 81 mg/dL (ref 70–99)
Potassium: 4 mmol/L (ref 3.5–5.2)
Sodium: 140 mmol/L (ref 134–144)
Total Protein: 6.3 g/dL (ref 6.0–8.5)
eGFR: 71 mL/min/{1.73_m2} (ref >59)

## 2023-11-14 LAB — VITAMIN D 25 HYDROXY (VIT D DEFICIENCY, FRACTURES): Vit D, 25-Hydroxy: 37.2 ng/mL (ref 30.0–100.0)

## 2023-11-14 LAB — CBC WITH DIFFERENTIAL/PLATELET
Basophils Absolute: 0 10*3/uL (ref 0.0–0.2)
Basos: 1 %
EOS (ABSOLUTE): 0.1 10*3/uL (ref 0.0–0.4)
Eos: 1 %
Hematocrit: 34.9 % (ref 34.0–46.6)
Hemoglobin: 11.8 g/dL (ref 11.1–15.9)
Immature Grans (Abs): 0 10*3/uL (ref 0.0–0.1)
Immature Granulocytes: 0 %
Lymphocytes Absolute: 1.9 10*3/uL (ref 0.7–3.1)
Lymphs: 39 %
MCH: 30.7 pg (ref 26.6–33.0)
MCHC: 33.8 g/dL (ref 31.5–35.7)
MCV: 91 fL (ref 79–97)
Monocytes Absolute: 0.4 10*3/uL (ref 0.1–0.9)
Monocytes: 8 %
Neutrophils Absolute: 2.5 10*3/uL (ref 1.4–7.0)
Neutrophils: 51 %
Platelets: 300 10*3/uL (ref 150–450)
RBC: 3.84 x10E6/uL (ref 3.77–5.28)
RDW: 11.2 % — ABNORMAL LOW (ref 11.7–15.4)
WBC: 4.9 10*3/uL (ref 3.4–10.8)

## 2023-11-14 LAB — LIPID PANEL
Cholesterol, Total: 199 mg/dL (ref 100–199)
HDL: 49 mg/dL (ref >39)
LDL CALC COMMENT:: 4.1 ratio (ref 0.0–4.4)
LDL Chol Calc (NIH): 134 mg/dL — ABNORMAL HIGH (ref 0–99)
Triglycerides: 89 mg/dL (ref 0–149)
VLDL Cholesterol Cal: 16 mg/dL (ref 5–40)

## 2023-11-14 LAB — TSH: TSH: 1.8 u[IU]/mL (ref 0.450–4.500)

## 2024-01-23 ENCOUNTER — Other Ambulatory Visit: Payer: Self-pay | Admitting: *Deleted

## 2024-01-23 DIAGNOSIS — M25562 Pain in left knee: Secondary | ICD-10-CM

## 2024-01-23 MED ORDER — DICLOFENAC SODIUM 75 MG PO TBEC: 75.0000 mg | DELAYED_RELEASE_TABLET | Freq: Two times a day (BID) | ORAL | 1 refills | Status: DC

## 2024-03-28 ENCOUNTER — Encounter: Payer: Self-pay | Admitting: Family

## 2024-03-28 ENCOUNTER — Ambulatory Visit (INDEPENDENT_AMBULATORY_CARE_PROVIDER_SITE_OTHER): Admitting: Family

## 2024-03-28 ENCOUNTER — Ambulatory Visit: Payer: Self-pay

## 2024-03-28 VITALS — BP 124/78 | HR 70 | Temp 97.6°F | Ht 64.0 in | Wt 213.0 lb

## 2024-03-28 DIAGNOSIS — I10 Essential (primary) hypertension: Secondary | ICD-10-CM

## 2024-03-28 DIAGNOSIS — R5383 Other fatigue: Secondary | ICD-10-CM

## 2024-03-28 DIAGNOSIS — R600 Localized edema: Secondary | ICD-10-CM | POA: Diagnosis not present

## 2024-03-28 MED ORDER — HYDROCHLOROTHIAZIDE 12.5 MG PO CAPS
12.5000 mg | ORAL_CAPSULE | Freq: Every day | ORAL | 2 refills | Status: AC
Start: 2024-03-28 — End: ?

## 2024-03-28 NOTE — Telephone Encounter (Signed)
 Apt scheduled.

## 2024-03-28 NOTE — Telephone Encounter (Signed)
 Copied from CRM [id number]. Topic: Clinical - Red Word Triage >> Mar 28, 2024  8:17 AM Carmell SAUNDERS wrote: Red Word that prompted transfer to Nurse Triage: Leg and feet swelling, feeling off. Would like to come in today to see Dr. Lavell and get labs done.

## 2024-03-28 NOTE — Telephone Encounter (Signed)
  FYI Only or Action Required?: FYI only for provider.  Patient was last seen in primary care on 11/13/2023 by Lavell Bari LABOR, FNP. Called Nurse Triage reporting Foot Swelling. Symptoms began a week ago & is off and on. Interventions attempted: Rest, hydration, or home remedies. Symptoms are: gradually worsening.  Triage Disposition: See Physician Within 24 Hours  Patient/caregiver understands and will follow disposition?: Yes     Appointment today 03/28/2024 at 2:05 PM with Doctor of the Day            At least a week off and on Blood pressures were good per patient    Reason for Disposition . [1] MODERATE leg swelling (e.g., swelling extends up to knees) AND [2] new-onset or worsening  Answer Assessment - Initial Assessment Questions 1. ONSET: When did the swelling start? (e.g., minutes, hours, days)     At least a week off and on 2. LOCATION: What part of the leg is swollen?  Are both legs swollen or just one leg?     Both feet up to calf 3. SEVERITY: How bad is the swelling? (e.g., localized; mild, moderate, severe)   - Localized: Small area of swelling localized to one leg.   - MILD pedal edema: Swelling limited to foot and ankle, pitting edema < 1/4 inch (6 mm) deep, rest and elevation eliminate most or all swelling.   - MODERATE edema: Swelling of lower leg to knee, pitting edema > 1/4 inch (6 mm) deep, rest and elevation only partially reduce swelling.   - SEVERE edema: Swelling extends above knee, facial or hand swelling present.      Some indention but went down with elevation last night and soaking of feet 4. REDNESS: Does the swelling look red or infected?     No 5. PAIN: Is the swelling painful to touch? If Yes, ask: How painful is it?   (Scale 1-10; mild, moderate or severe)     Dont hurt now but bottom of feet are sensitive and sore 6. FEVER: Do you have a fever? If Yes, ask: What is it, how was it measured, and when did it start?       No 7. CAUSE: What do you think is causing the leg swelling?     Unknown 8. MEDICAL HISTORY: Do you have a history of blood clots (e.g., DVT), cancer, heart failure, kidney disease, or liver failure?     None personally 9. RECURRENT SYMPTOM: Have you had leg swelling before? If Yes, ask: When was the last time? What happened that time?     Yes--swelling in the past and this is when Potassium levels were off 10. OTHER SYMPTOMS: Do you have any other symptoms? (e.g., chest pain, difficulty breathing)       Tired per patient    Random cramping in one foot at a time at different times Patient denies any pain at this time Patient is advised that if anything worsens to go to the Emergency Room. Patient verbalized understanding.  Protocols used: Leg Swelling and Edema-A-AH

## 2024-03-28 NOTE — Progress Notes (Signed)
 Subjective:    Patient ID: Sabrina [name], female    DOB: 1971-05-01, 53 y.o.   MRN: [id number]  Chief Complaint  Patient presents with   Edema    BLE   Pt presents to the office today with bilateral leg swelling that started two weeks on and off. Denies any long periods of sitting or plane rides. States it will go down during the night.   She does take Norvas 5 mg, but has been on this for years.   Complaining of fatigue that started 4 days ago.  Hypertension This is a chronic problem. The current episode started more than 1 year ago. The problem has been resolved since onset. Associated symptoms include malaise/fatigue and peripheral edema. Pertinent negatives include no anxiety. Past treatments include calcium channel blockers. The current treatment provides moderate improvement.      Review of Systems  Constitutional:  Positive for malaise/fatigue.  All other systems reviewed and are negative.   Social History   Socioeconomic History   Marital status: Married    Spouse name: Not on file   Number of children: Not on file   Years of education: Not on file   Highest education level: Not on file  Occupational History   Not on file  Tobacco Use   Smoking status: Never   Smokeless tobacco: Never  Vaping Use   Vaping status: Never Used  Substance and Sexual Activity   Alcohol use: No   Drug use: No   Sexual activity: Yes    Birth control/protection: Surgical    Comment: tubal  Other Topics Concern   Not on file  Social History Narrative   Not on file   Social Drivers of Health   Financial Resource Strain: Medium Risk (11/15/2022)   Overall Financial Resource Strain (CARDIA)    Difficulty of Paying Living Expenses: Somewhat hard  Food Insecurity: No Food Insecurity (05/08/2023)   Hunger Vital Sign    Worried About Running Out of Food in the Last Year: Never true    Ran Out of Food in the Last Year: Never true  Transportation Needs: No Transportation Needs  (05/08/2023)   PRAPARE - Administrator, Civil Service (Medical): No    Lack of Transportation (Non-Medical): No  Physical Activity: Insufficiently Active (11/15/2022)   Exercise Vital Sign    Days of Exercise per Week: 1 day    Minutes of Exercise per Session: 20 min  Stress: Stress Concern Present (11/15/2022)   Sabrina [name] of Occupational Health - Occupational Stress Questionnaire    Feeling of Stress : Rather much  Social Connections: Socially Integrated (11/15/2022)   Social Connection and Isolation Panel    Frequency of Communication with Friends and Family: More than three times a week    Frequency of Social Gatherings with Friends and Family: Once a week    Attends Religious Services: More than 4 times per year    Active Member of [employer] or Organizations: Yes    Attends Engineer, structural: More than 4 times per year    Marital Status: Married   Family History  Problem Relation Age of Onset   Diabetes Mother    Hypertension Mother    Heart attack Father    Pulmonary embolism Sister    Hypertension Other    Diabetes Other    Breast cancer Neg Hx         Objective:   Physical Exam Vitals reviewed.  Constitutional:  General: She is not in acute distress.    Appearance: She is well-developed.  HENT:     Head: Normocephalic and atraumatic.   Eyes:     Pupils: Pupils are equal, round, and reactive to light.   Neck:     Thyroid : No thyromegaly.   Cardiovascular:     Rate and Rhythm: Normal rate and regular rhythm.     Heart sounds: Normal heart sounds. No murmur heard. Pulmonary:     Effort: Pulmonary effort is normal. No respiratory distress.     Breath sounds: Normal breath sounds. No wheezing.  Abdominal:     General: Bowel sounds are normal. There is no distension.     Palpations: Abdomen is soft.     Tenderness: There is no abdominal tenderness.   Musculoskeletal:        General: No tenderness. Normal range of motion.      Cervical back: Normal range of motion and neck supple.     Right lower leg: Edema (trace) present.     Left lower leg: Edema (trace) present.   Skin:    General: Skin is warm and dry.   Neurological:     Mental Status: She is alert and oriented to person, place, and time.     Cranial Nerves: No cranial nerve deficit.     Deep Tendon Reflexes: Reflexes are normal and symmetric.   Psychiatric:        Behavior: Behavior normal.        Thought Content: Thought content normal.        Judgment: Judgment normal.       BP 124/78   Pulse 70   Temp 97.6 F (36.4 C)   Ht 5' 4 (1.626 m)   Wt 213 lb (96.6 kg)   SpO2 99%   BMI 36.56 kg/m      Assessment & Plan:  [name] comes in today with chief complaint of Edema (BLE)   Diagnosis and orders addressed:  1. Peripheral edema (Primary) - hydrochlorothiazide  (MICROZIDE ) 12.5 MG capsule; Take 1 capsule (12.5 mg total) by mouth daily.  Dispense: 90 capsule; Refill: 2 - CMP14+EGFR - Brain natriuretic peptide - Anemia Profile B - TSH - Compression stockings  2. Essential hypertension, benige - hydrochlorothiazide  (MICROZIDE ) 12.5 MG capsule; Take 1 capsule (12.5 mg total) by mouth daily.  Dispense: 90 capsule; Refill: 2 - CMP14+EGFR  3. Other fatigue  - CMP14+EGFR - Anemia Profile B - TSH   Labs pending Compression hose Low salt diet  Keep elevated  Hydrochlorothiazide  12.5 mg Prescription sent to pharmacy.  Stay hydrated  Continue current medications  Health Maintenance reviewed Diet and exercise encouraged    Bari Learn, FNP

## 2024-03-28 NOTE — Patient Instructions (Signed)
 Peripheral Edema  Peripheral edema is swelling that is caused by a buildup of fluid. Peripheral edema most often affects the lower legs, ankles, and feet. It can also develop in the arms, hands, and face. The area of the body that has peripheral edema will look swollen. It may also feel heavy or warm. Your clothes may start to feel tight. Pressing on the area may make a temporary dent in your skin (pitting edema). You may not be able to move your swollen arm or leg as much as usual. There are many causes of peripheral edema. It can happen because of a complication of other conditions such as heart failure, kidney disease, or a problem with your circulation. It also can be a side effect of certain medicines or happen because of an infection. It often happens to women during pregnancy. Sometimes, the cause is not known. Follow these instructions at home: Managing pain, stiffness, and swelling  Raise (elevate) your legs while you are sitting or lying down. Move around often to prevent stiffness and to reduce swelling. Do not sit or stand for long periods of time. Do not wear tight clothing. Do not wear garters on your upper legs. Exercise your legs to get your circulation going. This helps to move the fluid back into your blood vessels, and it may help the swelling go down. Wear compression stockings as told by your health care provider. These stockings help to prevent blood clots and reduce swelling in your legs. It is important that these are the correct size. These stockings should be prescribed by your doctor to prevent possible injuries. If elastic bandages or wraps are recommended, use them as told by your health care provider. Medicines Take over-the-counter and prescription medicines only as told by your health care provider. Your health care provider may prescribe medicine to help your body get rid of excess water (diuretic). Take this medicine if you are told to take it. General  instructions Eat a low-salt (low-sodium) diet as told by your health care provider. Sometimes, eating less salt may reduce swelling. Pay attention to any changes in your symptoms. Moisturize your skin daily to help prevent skin from cracking and draining. Keep all follow-up visits. This is important. Contact a health care provider if: You have a fever. You have swelling in only one leg. You have increased swelling, redness, or pain in one or both of your legs. You have drainage or sores at the area where you have edema. Get help right away if: You have edema that starts suddenly or is getting worse, especially if you are pregnant or have a medical condition. You develop shortness of breath, especially when you are lying down. You have pain in your chest or abdomen. You feel weak. You feel like you will faint. These symptoms may be an emergency. Get help right away. Call 911. Do not wait to see if the symptoms will go away. Do not drive yourself to the hospital. Summary Peripheral edema is swelling that is caused by a buildup of fluid. Peripheral edema most often affects the lower legs, ankles, and feet. Move around often to prevent stiffness and to reduce swelling. Do not sit or stand for long periods of time. Pay attention to any changes in your symptoms. Contact a health care provider if you have edema that starts suddenly or is getting worse, especially if you are pregnant or have a medical condition. Get help right away if you develop shortness of breath, especially when lying down.  This information is not intended to replace advice given to you by your health care provider. Make sure you discuss any questions you have with your health care provider. Document Revised: 05/24/2021 Document Reviewed: 05/24/2021 Elsevier Patient Education  2024 [employer].

## 2024-03-29 ENCOUNTER — Ambulatory Visit: Payer: Self-pay | Admitting: Family

## 2024-03-29 LAB — ANEMIA PROFILE B
Basophils Absolute: 0 10*3/uL (ref 0.0–0.2)
Basos: 1 %
EOS (ABSOLUTE): 0 10*3/uL (ref 0.0–0.4)
Eos: 0 %
Ferritin: 17 ng/mL (ref 15–150)
Folate: >20 ng/mL (ref >3.0)
Hematocrit: 33.4 % — ABNORMAL LOW (ref 34.0–46.6)
Hemoglobin: 11.2 g/dL (ref 11.1–15.9)
Immature Grans (Abs): 0 10*3/uL (ref 0.0–0.1)
Immature Granulocytes: 0 %
Iron Saturation: 15 % (ref 15–55)
Iron: 58 ug/dL (ref 27–159)
Lymphocytes Absolute: 1.5 10*3/uL (ref 0.7–3.1)
Lymphs: 33 %
MCH: 30.2 pg (ref 26.6–33.0)
MCHC: 33.5 g/dL (ref 31.5–35.7)
MCV: 90 fL (ref 79–97)
Monocytes Absolute: 0.3 10*3/uL (ref 0.1–0.9)
Monocytes: 6 %
Neutrophils Absolute: 2.8 10*3/uL (ref 1.4–7.0)
Neutrophils: 60 %
Platelets: 266 10*3/uL (ref 150–450)
RBC: 3.71 x10E6/uL — ABNORMAL LOW (ref 3.77–5.28)
RDW: 11.7 % (ref 11.7–15.4)
Retic Ct Pct: 2.1 % (ref 0.6–2.6)
Total Iron Binding Capacity: 394 ug/dL (ref 250–450)
UIBC: 336 ug/dL (ref 131–425)
Vitamin B-12: 317 pg/mL (ref 232–1245)
WBC: 4.6 10*3/uL (ref 3.4–10.8)

## 2024-03-29 LAB — BRAIN NATRIURETIC PEPTIDE: BNP: 30.4 pg/mL (ref 0.0–100.0)

## 2024-03-29 LAB — CMP14+EGFR
ALT: 27 IU/L (ref 0–32)
AST: 15 IU/L (ref 0–40)
Albumin: 3.9 g/dL (ref 3.8–4.9)
Alkaline Phosphatase: 56 IU/L (ref 44–121)
BUN/Creatinine Ratio: 11 (ref 9–23)
BUN: 11 mg/dL (ref 6–24)
Bilirubin Total: 0.3 mg/dL (ref 0.0–1.2)
CO2: 20 mmol/L (ref 20–29)
Calcium: 9.4 mg/dL (ref 8.7–10.2)
Chloride: 104 mmol/L (ref 96–106)
Creatinine, Ser: 0.99 mg/dL (ref 0.57–1.00)
Globulin, Total: 2.4 g/dL (ref 1.5–4.5)
Glucose: 102 mg/dL — ABNORMAL HIGH (ref 70–99)
Potassium: 4.1 mmol/L (ref 3.5–5.2)
Sodium: 138 mmol/L (ref 134–144)
Total Protein: 6.3 g/dL (ref 6.0–8.5)
eGFR: 69 mL/min/{1.73_m2} (ref >59)

## 2024-03-29 LAB — TSH: TSH: 2.17 u[IU]/mL (ref 0.450–4.500)

## 2024-04-24 ENCOUNTER — Encounter: Payer: Self-pay | Admitting: Dermatology

## 2024-04-24 ENCOUNTER — Ambulatory Visit: Admitting: Dermatology

## 2024-04-24 VITALS — BP 135/84 | HR 72

## 2024-04-24 DIAGNOSIS — L720 Epidermal cyst: Secondary | ICD-10-CM | POA: Diagnosis not present

## 2024-04-24 DIAGNOSIS — D485 Neoplasm of uncertain behavior of skin: Secondary | ICD-10-CM

## 2024-04-24 DIAGNOSIS — L819 Disorder of pigmentation, unspecified: Secondary | ICD-10-CM

## 2024-04-24 NOTE — Patient Instructions (Signed)

## 2024-04-24 NOTE — Progress Notes (Signed)
   New Patient Visit   Subjective  Sabrina [name] is a 53 y.o. female who presents for the following: Here to discuss treatment for a cyst. Lesion in mid chest, present for several years, growing in size. Not previously treated.   She also mentions some discoloration on her lips, has come and gone, seems better than previously currently. No itching.    The following portions of the chart were reviewed this encounter and updated as appropriate: medications, allergies, medical history  Review of Systems:  No other skin or systemic complaints except as noted in HPI or Assessment and Plan.  Objective  Well appearing patient in no apparent distress; mood and affect are within normal limits.  A focused examination was performed of the following areas: Chest face  Relevant exam findings are noted in the Assessment and Plan.    Assessment & Plan   EPIDERMAL INCLUSION CYST Exam: Subcutaneous nodule at chest  Benign-appearing. Exam most consistent with an epidermal inclusion cyst. Discussed that a cyst is a benign growth that can grow over time and sometimes get irritated or inflamed. Recommend observation if it is not bothersome. Discussed option of surgical excision to remove it if it is growing, symptomatic, or other changes noted. Please call for new or changing lesions so they can be evaluated.  Discoloration of Lips - Addressed at the end of the visit - Discussed that vitiligo is in the differential, mentions no other large discolored lesions on body. Improving currently, so will defer therapy for now - Recommend following up with General Dermatology   NEOPLASM OF UNCERTAIN BEHAVIOR OF SKIN Chest - Medial [facility]) Return for a surgical excision of likely cyst to fully remove.  Patient may schedule when she is ready.  Return for return for excision of cyst when ready. also schedule an app with Dr. Alm for loss of pigment .  I, Berwyn Lesches, Surg Tech III, am acting as scribe  for RUFUS CHRISTELLA HOLY, MD.   Documentation: I have reviewed the above documentation for accuracy and completeness, and I agree with the above.  RUFUS CHRISTELLA HOLY, MD

## 2024-05-28 ENCOUNTER — Ambulatory Visit (INDEPENDENT_AMBULATORY_CARE_PROVIDER_SITE_OTHER): Admitting: Dermatology

## 2024-05-28 ENCOUNTER — Encounter: Payer: Self-pay | Admitting: Dermatology

## 2024-05-28 VITALS — BP 127/78

## 2024-05-28 DIAGNOSIS — L819 Disorder of pigmentation, unspecified: Secondary | ICD-10-CM

## 2024-05-28 NOTE — Progress Notes (Signed)
   Follow-Up Visit   Subjective  Sabrina [name] is a 53 y.o. female who presents for the following: Loss of pigment of lips. It happened about 5 years ago and the pigmented came back after about 6 months. That lasted 3 or 4 months and then she lost pigment again. There were depigmented spots in the corner of her mouth and under her lower lip with this flare but they had repigmented. She has never been diagnosed with vitiligo.   The following portions of the chart were reviewed this encounter and updated as appropriate: medications, allergies, medical history  Review of Systems:  No other skin or systemic complaints except as noted in HPI or Assessment and Plan.  Objective  Well appearing patient in no apparent distress; mood and affect are within normal limits.   A focused examination was performed of the following areas: Face  Relevant exam findings are noted in the Assessment and Plan.      Assessment & Plan   VITILIGO VS POSTINFLAMMATORY HYPOPIGMENTATION Exam: depigmented patches on lips  Patient Education Discussed During Visit: Vitiligo is a chronic autoimmune condition which causes loss of skin pigment and is commonly seen on the face and may also involve areas of trauma like hands, elbows, knees, and ankles. There is no cure and it is difficult to treat.  Treatments include topical steroids and other topical anti-inflammatory ointments/creams and topical and oral Jak inhibitors.  Sometimes narrow band UV light therapy or Xtrac laser is helpful, both of which require twice weekly treatments for at least 3-6 months.  Antioxidant vitamins, such as Vitamins A,C,E,D, Folic Acid  and B12 may be added to enhance treatment. Heliocare may also enhance treatment results.   - Assessment: Patient presents with recurrent depigmentation of the lips, first occurring 5 years ago during COVID pandemic with 19-month duration before repigmentation. Condition recurred after 3-4 months. Corner of  mouth and spot underneath lip previously affected but have repigmented. No known triggers or irritations identified. Examination with special light highly suspicious for vitiligo, though post-inflammatory hypopigmentation considered. Lack of clear triggering event and spontaneous pigment loss favor vitiligo diagnosis.  - Plan:    Initiate Opzelura cream treatment (sample provided)    Apply twice daily for 3 months, mixing chocolate chip size amount with equal parts Aquaphor before application    Discontinue current lip products including Elf lip gloss    Recommend Aquaphor Body Balm as lip protectant    Provide patient education on treatment mechanism and expected timeline for results  Follow-up in 3 months to assess treatment response.   Return in about 3 months (around 08/28/2024) for vitiligo vs PIH.  I, Roseline Hutchinson, CMA, am acting as scribe for [employer], DO .   Documentation: I have reviewed the above documentation for accuracy and completeness, and I agree with the above.  Delon Lenis, DO

## 2024-05-28 NOTE — Patient Instructions (Addendum)
 Date: Tue May 28 2024  Sabrina [name],  Thank you for visiting today. Here is a summary of the key instructions:  - Medications:   - Apply Opzelura cream mixed with Aquaphor to affected areas on lips twice daily for 3 months   - Use a chocolate chip-sized amount of each   - Mix together and apply as an ointment  - Skin Care:   - Use Aquaphor Body Balm stick for lip protection   - Avoid using other lip products, including Elf lip gloss  - Follow-up:   - Return for follow-up appointment in 3 months  Please reach out if you have any questions or concerns.  Warm regards,  Dr. Delon Lenis Dermatology     Important Information  Due to recent changes in healthcare laws, you may see results of your pathology and/or laboratory studies on MyChart before the doctors have had a chance to review them. We understand that in some cases there may be results that are confusing or concerning to you. Please understand that not all results are received at the same time and often the doctors may need to interpret multiple results in order to provide you with the best plan of care or course of treatment. Therefore, we ask that you please give us  2 business days to thoroughly review all your results before contacting the office for clarification. Should we see a critical lab result, you will be contacted sooner.   If You Need Anything After Your Visit  If you have any questions or concerns for your doctor, please call our main line at [phone] If no one answers, please leave a voicemail as directed and we will return your call as soon as possible. Messages left after 4 pm will be answered the following business day.   You may also send us  a message via MyChart. We typically respond to MyChart messages within 1-2 business days.  For prescription refills, please ask your pharmacy to contact our office. Our fax number is [phone].  If you have an urgent issue when the clinic is closed  that cannot wait until the next business day, you can page your doctor at the number below.    Please note that while we do our best to be available for urgent issues outside of office hours, we are not available 24/7.   If you have an urgent issue and are unable to reach us , you may choose to seek medical care at your doctor's office, retail clinic, urgent care center, or emergency room.  If you have a medical emergency, please immediately call 911 or go to the emergency department. In the event of inclement weather, please call our main line at [phone] for an update on the status of any delays or closures.  Dermatology Medication Tips: Please keep the boxes that topical medications come in in order to help keep track of the instructions about where and how to use these. Pharmacies typically print the medication instructions only on the boxes and not directly on the medication tubes.   If your medication is too expensive, please contact our office at [phone] or send us  a message through MyChart.   We are unable to tell what your co-pay for medications will be in advance as this is different depending on your insurance coverage. However, we may be able to find a substitute medication at lower cost or fill out paperwork to get insurance to cover a needed medication.   If a prior authorization is required  to get your medication covered by your insurance company, please allow us  1-2 business days to complete this process.  Drug prices often vary depending on where the prescription is filled and some pharmacies may offer cheaper prices.  The website www.goodrx.com contains coupons for medications through different pharmacies. The prices here do not account for what the cost may be with help from insurance (it may be cheaper with your insurance), but the website can give you the price if you did not use any insurance.  - You can print the associated coupon and take it with your prescription  to the pharmacy.  - You may also stop by our office during regular business hours and pick up a GoodRx coupon card.  - If you need your prescription sent electronically to a different pharmacy, notify our office through [facility] or by phone at [phone]

## 2024-08-12 ENCOUNTER — Telehealth: Payer: Self-pay | Admitting: Family Medicine

## 2024-08-12 NOTE — Telephone Encounter (Signed)
 The patient called back checking to see if her provider has responded to her message from this morning. I told her it doesn't look that way yet but she just left the message 2 hours ago. She wanted her provider to know she was also going to contact her dermatologist as well with [facility].

## 2024-08-12 NOTE — Telephone Encounter (Signed)
 Copied from CRM #[id number]. Topic: Clinical - Medical Advice >> Aug 12, 2024  9:48 AM Cherylann RAMAN wrote: Reason for CRM: Patient has a cyst on her chest that she has seen the the dermatologist for. Patient states that it is now inflamed and draining. Patient would like to know if she should go and see her Dermatologist or come in to the office for a visit. Patient states that she is in discomfort with a mild burning. Please Advise. Patient can be contacted at [phone].

## 2024-08-12 NOTE — Telephone Encounter (Signed)
 Pt can do either. I would be glad to see her today if she can make it.

## 2024-08-12 NOTE — Telephone Encounter (Signed)
 Patient states she is going to the ER or urgent care

## 2024-08-13 ENCOUNTER — Other Ambulatory Visit: Payer: Self-pay

## 2024-08-13 MED ORDER — DOXYCYCLINE HYCLATE 100 MG PO TABS
100.0000 mg | ORAL_TABLET | Freq: Two times a day (BID) | ORAL | 0 refills | Status: AC
Start: 2024-08-13 — End: 2024-08-23

## 2024-08-19 ENCOUNTER — Other Ambulatory Visit: Payer: Self-pay | Admitting: Dermatology

## 2024-08-19 DIAGNOSIS — B379 Candidiasis, unspecified: Secondary | ICD-10-CM

## 2024-08-19 MED ORDER — FLUCONAZOLE 150 MG PO TABS
150.0000 mg | ORAL_TABLET | ORAL | 0 refills | Status: AC
Start: 2024-08-19 — End: 2024-09-02

## 2024-08-27 ENCOUNTER — Other Ambulatory Visit: Payer: Self-pay | Admitting: Family Medicine

## 2024-08-27 ENCOUNTER — Encounter: Payer: Self-pay | Admitting: Dermatology

## 2024-08-27 ENCOUNTER — Ambulatory Visit (INDEPENDENT_AMBULATORY_CARE_PROVIDER_SITE_OTHER): Admitting: Dermatology

## 2024-08-27 VITALS — BP 133/88 | HR 75

## 2024-08-27 DIAGNOSIS — I10 Essential (primary) hypertension: Secondary | ICD-10-CM

## 2024-08-27 DIAGNOSIS — L80 Vitiligo: Secondary | ICD-10-CM | POA: Diagnosis not present

## 2024-08-27 MED ORDER — OPZELURA 1.5 % EX CREA: 1.0000 | TOPICAL_CREAM | Freq: Every day | CUTANEOUS | 2 refills | Status: DC

## 2024-08-27 NOTE — Patient Instructions (Addendum)
 VISIT SUMMARY:  Today, we discussed the progress of your vitiligo treatment. You have noticed some repigmentation, especially on one side of your face, and you have been using Opzelura  diligently. We also talked about the potential benefits of natural sunlight exposure for your condition.  YOUR PLAN:  -VITILIGO:  Vitiligo is a condition where the skin loses its pigment cells, leading to white patches. You are experiencing good repigmentation, especially on your face, and the condition is slowly improving. Continue using Opzelura  as prescribed. A prescription has been sent to a special pharmacy in [place] to help reduce your copay.   If the copay remains high, you may consider using a compounding pharmacy option at $45. We will schedule a follow-up appointment in 9 months to assess your progress.  INSTRUCTIONS:  We will schedule a follow-up appointment in 9 months to assess your progress with the vitiligo treatment.    Important Information   Due to recent changes in healthcare laws, you may see results of your pathology and/or laboratory studies on MyChart before the doctors have had a chance to review them. We understand that in some cases there may be results that are confusing or concerning to you. Please understand that not all results are received at the same time and often the doctors may need to interpret multiple results in order to provide you with the best plan of care or course of treatment. Therefore, we ask that you please give us  2 business days to thoroughly review all your results before contacting the office for clarification. Should we see a critical lab result, you will be contacted sooner.     If You Need Anything After Your Visit   If you have any questions or concerns for your doctor, please call our main line at [phone]. If no one answers, please leave a voicemail as directed and we will return your call as soon as possible. Messages left after 4 pm will be  answered the following business day.    You may also send us  a message via MyChart. We typically respond to MyChart messages within 1-2 business days.  For prescription refills, please ask your pharmacy to contact our office. Our fax number is [phone].  If you have an urgent issue when the clinic is closed that cannot wait until the next business day, you can page your doctor at the number below.     Please note that while we do our best to be available for urgent issues outside of office hours, we are not available 24/7.    If you have an urgent issue and are unable to reach us , you may choose to seek medical care at your doctor's office, retail clinic, urgent care center, or emergency room.   If you have a medical emergency, please immediately call 911 or go to the emergency department. In the event of inclement weather, please call our main line at [phone] for an update on the status of any delays or closures.  Dermatology Medication Tips: Please keep the boxes that topical medications come in in order to help keep track of the instructions about where and how to use these. Pharmacies typically print the medication instructions only on the boxes and not directly on the medication tubes.   If your medication is too expensive, please contact our office at [phone] or send us  a message through MyChart.    We are unable to tell what your co-pay for medications will be in advance as this is different depending  on your insurance coverage. However, we may be able to find a substitute medication at lower cost or fill out paperwork to get insurance to cover a needed medication.    If a prior authorization is required to get your medication covered by your insurance company, please allow us  1-2 business days to complete this process.   Drug prices often vary depending on where the prescription is filled and some pharmacies may offer cheaper prices.   The website www.goodrx.com  contains coupons for medications through different pharmacies. The prices here do not account for what the cost may be with help from insurance (it may be cheaper with your insurance), but the website can give you the price if you did not use any insurance.  - You can print the associated coupon and take it with your prescription to the pharmacy.  - You may also stop by our office during regular business hours and pick up a GoodRx coupon card.  - If you need your prescription sent electronically to a different pharmacy, notify our office through [facility] or by phone at [phone]

## 2024-08-27 NOTE — Progress Notes (Signed)
   Follow-Up Visit   Subjective  Sabrina [name] is a 53 y.o. female who presents for the following: Vitilgo of lips  Patient present today for follow up visit for vitiligo of the lips. Patient was last evaluated on 05/28/24. At this visit patient was provided samples of Opzelura . Patient reports sxs are improving. Patient denies medication changes.  The following portions of the chart were reviewed this encounter and updated as appropriate: medications, allergies, medical history  Review of Systems:  No other skin or systemic complaints except as noted in HPI or Assessment and Plan.  Objective  Well appearing patient in no apparent distress; mood and affect are within normal limits.  A focused examination was performed of the following areas: Lips  Relevant exam findings are noted in the Assessment and Plan.    Assessment & Plan    Vitiligo (Improved) Good repigmentation, especially on the side of the face. The condition is improving slowly, with some areas still prominent but shrinking.   - Continue Opzelura  treatment. - Sent prescription to special pharmacy in [place] to reduce copay. - If copay is high, consider compounding pharmacy option at $45. - Will schedule follow-up appointment in 9 months to assess progress. VITILIGO   Related Medications Ruxolitinib Phosphate  (OPZELURA ) 1.5 % CREA Apply 1 Application topically daily.  Return in about 9 months (around 05/27/2025) for Vitiligo F/U.  I, Jetta Ager, am acting as neurosurgeon for [employer], DO.  Documentation: I have reviewed the above documentation for accuracy and completeness, and I agree with the above.  Delon Lenis, DO

## 2024-09-23 ENCOUNTER — Other Ambulatory Visit: Payer: Self-pay

## 2024-09-23 ENCOUNTER — Encounter: Payer: Self-pay | Admitting: Dermatology

## 2024-09-23 DIAGNOSIS — L80 Vitiligo: Secondary | ICD-10-CM

## 2024-09-23 MED ORDER — OPZELURA 1.5 % EX CREA: 1.0000 | TOPICAL_CREAM | Freq: Two times a day (BID) | CUTANEOUS | 6 refills | Status: DC

## 2024-10-07 NOTE — Telephone Encounter (Signed)
 Tacrolimus 0.3% would be the generic alternative to be applied BID.  thanks

## 2024-10-09 ENCOUNTER — Other Ambulatory Visit: Payer: Self-pay | Admitting: *Deleted

## 2024-10-09 DIAGNOSIS — M25562 Pain in left knee: Secondary | ICD-10-CM

## 2024-10-09 MED ORDER — DICLOFENAC SODIUM 75 MG PO TBEC: 75.0000 mg | DELAYED_RELEASE_TABLET | Freq: Two times a day (BID) | ORAL | 0 refills | Status: AC

## 2024-10-14 ENCOUNTER — Other Ambulatory Visit: Payer: Self-pay

## 2024-10-14 DIAGNOSIS — L80 Vitiligo: Secondary | ICD-10-CM

## 2024-10-14 MED ORDER — OPZELURA 1.5 % EX CREA: 1.0000 | TOPICAL_CREAM | Freq: Every day | CUTANEOUS | 9 refills | Status: AC

## 2024-10-14 NOTE — Progress Notes (Signed)
 Dx: Vitiligo (L80)  Medications previously tried and failed include: - Triamcinolone Ointment  - Tacrolimus Ointment  VITILIGO Exam: depigmented patches on lip BSA: <10%  Vitiligo is a chronic autoimmune condition which causes loss of skin pigment and is commonly seen on the face and may also involve areas of trauma like hands, elbows, knees, and ankles. There is no cure and it is difficult to treat.  Treatments include topical steroids and other topical anti-inflammatory ointments/creams and topical and oral Jak inhibitors.  Sometimes narrow band UV light therapy or Xtrac laser is helpful, both of which require twice weekly treatments for at least 3-6 months.  Antioxidant vitamins, such as Vitamins A,C,E,D, Folic Acid  and B12 may be added to enhance treatment. Heliocare may also enhance treatment results.  Treatment Plan: - Prescribed Opzelura  to apply topically daily, Rx sent to [facility] for Prior Auth

## 2024-11-14 ENCOUNTER — Encounter: Payer: 59 | Admitting: Family

## 2025-05-27 ENCOUNTER — Ambulatory Visit: Admitting: Dermatology
